# Patient Record
Sex: Female | Born: 1940
Health system: Southern US, Community
[De-identification: ages and names within clinical notes are randomized; demographics above are authoritative.]

## PROBLEM LIST (undated history)

## (undated) DIAGNOSIS — N183 Chronic kidney disease, stage 3 unspecified: Secondary | ICD-10-CM

## (undated) DIAGNOSIS — I10 Essential (primary) hypertension: Secondary | ICD-10-CM

## (undated) DIAGNOSIS — J45909 Unspecified asthma, uncomplicated: Secondary | ICD-10-CM

## (undated) DIAGNOSIS — J309 Allergic rhinitis, unspecified: Secondary | ICD-10-CM

## (undated) DIAGNOSIS — M4800 Spinal stenosis, site unspecified: Secondary | ICD-10-CM

## (undated) DIAGNOSIS — M1990 Unspecified osteoarthritis, unspecified site: Secondary | ICD-10-CM

## (undated) DIAGNOSIS — R7301 Impaired fasting glucose: Secondary | ICD-10-CM

## (undated) DIAGNOSIS — M8580 Other specified disorders of bone density and structure, unspecified site: Secondary | ICD-10-CM

## (undated) HISTORY — DX: Spinal stenosis, site unspecified: M48.00

## (undated) HISTORY — DX: Essential (primary) hypertension: I10

## (undated) HISTORY — PX: KNEE ARTHROSCOPY: SUR90

## (undated) HISTORY — DX: Impaired fasting glucose: R73.01

## (undated) HISTORY — PX: ABDOMINAL HYSTERECTOMY: SHX81

## (undated) HISTORY — DX: Other specified disorders of bone density and structure, unspecified site: M85.80

## (undated) HISTORY — PX: CHOLECYSTECTOMY: SHX55

## (undated) HISTORY — PX: TOTAL ABDOMINAL HYSTERECTOMY W/ BILATERAL SALPINGOOPHORECTOMY: SHX83

## (undated) HISTORY — PX: CARPAL TUNNEL RELEASE: SHX101

## (undated) HISTORY — PX: BREAST EXCISIONAL BIOPSY: SUR124

## (undated) HISTORY — DX: Unspecified osteoarthritis, unspecified site: M19.90

## (undated) HISTORY — PX: BACK SURGERY: SHX140

## (undated) HISTORY — DX: Unspecified asthma, uncomplicated: J45.909

## (undated) HISTORY — DX: Allergic rhinitis, unspecified: J30.9

## (undated) HISTORY — DX: Chronic kidney disease, stage 3 unspecified: N18.30

---

## 1999-02-03 ENCOUNTER — Encounter: Payer: Self-pay | Admitting: Neurosurgery

## 1999-02-03 ENCOUNTER — Ambulatory Visit (HOSPITAL_COMMUNITY): Admission: RE | Admit: 1999-02-03 | Discharge: 1999-02-03 | Payer: Self-pay | Admitting: Neurosurgery

## 2000-10-21 ENCOUNTER — Encounter: Payer: Self-pay | Admitting: General Surgery

## 2000-10-21 ENCOUNTER — Ambulatory Visit (HOSPITAL_COMMUNITY): Admission: RE | Admit: 2000-10-21 | Discharge: 2000-10-21 | Payer: Self-pay | Admitting: Family Medicine

## 2001-11-13 ENCOUNTER — Ambulatory Visit (HOSPITAL_COMMUNITY): Admission: RE | Admit: 2001-11-13 | Discharge: 2001-11-13 | Payer: Self-pay | Admitting: Family Medicine

## 2001-11-13 ENCOUNTER — Encounter: Payer: Self-pay | Admitting: Family Medicine

## 2001-12-19 ENCOUNTER — Encounter: Payer: Self-pay | Admitting: Specialist

## 2001-12-19 ENCOUNTER — Encounter: Admission: RE | Admit: 2001-12-19 | Discharge: 2001-12-19 | Payer: Self-pay | Admitting: Specialist

## 2002-02-05 ENCOUNTER — Encounter: Payer: Self-pay | Admitting: Specialist

## 2002-02-11 ENCOUNTER — Encounter: Payer: Self-pay | Admitting: Specialist

## 2002-02-11 ENCOUNTER — Inpatient Hospital Stay (HOSPITAL_COMMUNITY): Admission: RE | Admit: 2002-02-11 | Discharge: 2002-02-15 | Payer: Self-pay | Admitting: Specialist

## 2002-05-28 ENCOUNTER — Encounter: Payer: Self-pay | Admitting: Emergency Medicine

## 2002-05-28 ENCOUNTER — Emergency Department (HOSPITAL_COMMUNITY): Admission: EM | Admit: 2002-05-28 | Discharge: 2002-05-28 | Payer: Self-pay | Admitting: Emergency Medicine

## 2002-11-26 ENCOUNTER — Ambulatory Visit (HOSPITAL_COMMUNITY): Admission: RE | Admit: 2002-11-26 | Discharge: 2002-11-26 | Payer: Self-pay | Admitting: Family Medicine

## 2002-11-26 ENCOUNTER — Encounter: Payer: Self-pay | Admitting: Family Medicine

## 2003-11-29 ENCOUNTER — Ambulatory Visit (HOSPITAL_COMMUNITY): Admission: RE | Admit: 2003-11-29 | Discharge: 2003-11-29 | Payer: Self-pay | Admitting: Family Medicine

## 2004-11-27 ENCOUNTER — Ambulatory Visit (HOSPITAL_COMMUNITY): Admission: RE | Admit: 2004-11-27 | Discharge: 2004-11-27 | Payer: Self-pay | Admitting: Specialist

## 2004-11-27 ENCOUNTER — Ambulatory Visit (HOSPITAL_BASED_OUTPATIENT_CLINIC_OR_DEPARTMENT_OTHER): Admission: RE | Admit: 2004-11-27 | Discharge: 2004-11-27 | Payer: Self-pay | Admitting: Specialist

## 2004-12-14 ENCOUNTER — Ambulatory Visit (HOSPITAL_COMMUNITY): Admission: RE | Admit: 2004-12-14 | Discharge: 2004-12-14 | Payer: Self-pay | Admitting: Family Medicine

## 2005-12-10 ENCOUNTER — Ambulatory Visit (HOSPITAL_COMMUNITY): Admission: RE | Admit: 2005-12-10 | Discharge: 2005-12-10 | Payer: Self-pay | Admitting: Family Medicine

## 2005-12-18 ENCOUNTER — Ambulatory Visit (HOSPITAL_COMMUNITY): Admission: RE | Admit: 2005-12-18 | Discharge: 2005-12-18 | Payer: Self-pay | Admitting: Family Medicine

## 2006-12-23 ENCOUNTER — Ambulatory Visit (HOSPITAL_COMMUNITY): Admission: RE | Admit: 2006-12-23 | Discharge: 2006-12-23 | Payer: Self-pay | Admitting: Family Medicine

## 2007-12-25 ENCOUNTER — Ambulatory Visit (HOSPITAL_COMMUNITY): Admission: RE | Admit: 2007-12-25 | Discharge: 2007-12-25 | Payer: Self-pay | Admitting: Family Medicine

## 2008-12-27 ENCOUNTER — Ambulatory Visit (HOSPITAL_COMMUNITY): Admission: RE | Admit: 2008-12-27 | Discharge: 2008-12-27 | Payer: Self-pay | Admitting: Family Medicine

## 2009-12-29 ENCOUNTER — Ambulatory Visit (HOSPITAL_COMMUNITY): Admission: RE | Admit: 2009-12-29 | Discharge: 2009-12-29 | Payer: Self-pay | Admitting: Family Medicine

## 2010-01-05 ENCOUNTER — Ambulatory Visit (HOSPITAL_COMMUNITY): Admission: RE | Admit: 2010-01-05 | Discharge: 2010-01-05 | Payer: Self-pay | Admitting: Family Medicine

## 2010-01-22 LAB — HM COLONOSCOPY

## 2010-01-24 ENCOUNTER — Ambulatory Visit (HOSPITAL_COMMUNITY): Admission: RE | Admit: 2010-01-24 | Discharge: 2010-01-24 | Payer: Self-pay | Admitting: General Surgery

## 2010-03-18 ENCOUNTER — Observation Stay (HOSPITAL_COMMUNITY)
Admission: EM | Admit: 2010-03-18 | Discharge: 2010-03-19 | Payer: Self-pay | Source: Home / Self Care | Admitting: Emergency Medicine

## 2010-03-20 ENCOUNTER — Encounter: Payer: Self-pay | Admitting: Cardiology

## 2010-03-20 ENCOUNTER — Ambulatory Visit (HOSPITAL_COMMUNITY): Admission: RE | Admit: 2010-03-20 | Discharge: 2010-03-20 | Payer: Self-pay | Admitting: Internal Medicine

## 2010-07-06 ENCOUNTER — Ambulatory Visit (HOSPITAL_COMMUNITY)
Admission: RE | Admit: 2010-07-06 | Discharge: 2010-07-06 | Disposition: A | Discharge status: Home / Self Care | Payer: Medicare Other | Source: Ambulatory Visit | Attending: Specialist | Admitting: Specialist

## 2010-07-06 ENCOUNTER — Other Ambulatory Visit (HOSPITAL_COMMUNITY): Payer: Self-pay | Admitting: Specialist

## 2010-07-06 ENCOUNTER — Other Ambulatory Visit: Payer: Self-pay | Admitting: Specialist

## 2010-07-06 ENCOUNTER — Encounter (HOSPITAL_COMMUNITY): Payer: Medicare Other

## 2010-07-06 DIAGNOSIS — M1610 Unilateral primary osteoarthritis, unspecified hip: Secondary | ICD-10-CM | POA: Insufficient documentation

## 2010-07-06 DIAGNOSIS — Z01818 Encounter for other preprocedural examination: Secondary | ICD-10-CM

## 2010-07-06 DIAGNOSIS — Z981 Arthrodesis status: Secondary | ICD-10-CM | POA: Insufficient documentation

## 2010-07-06 DIAGNOSIS — Z01812 Encounter for preprocedural laboratory examination: Secondary | ICD-10-CM | POA: Insufficient documentation

## 2010-07-06 DIAGNOSIS — M169 Osteoarthritis of hip, unspecified: Secondary | ICD-10-CM | POA: Insufficient documentation

## 2010-07-06 DIAGNOSIS — I10 Essential (primary) hypertension: Secondary | ICD-10-CM | POA: Insufficient documentation

## 2010-07-06 LAB — COMPREHENSIVE METABOLIC PANEL
ALT: 13 U/L (ref 0–35)
Alkaline Phosphatase: 81 U/L (ref 39–117)
BUN: 12 mg/dL (ref 6–23)
CO2: 26 mEq/L (ref 19–32)
Creatinine, Ser: 0.65 mg/dL (ref 0.4–1.2)
Potassium: 4 mEq/L (ref 3.5–5.1)
Sodium: 139 mEq/L (ref 135–145)
Total Bilirubin: 0.8 mg/dL (ref 0.3–1.2)

## 2010-07-06 LAB — DIFFERENTIAL
Basophils Absolute: 0 10*3/uL (ref 0.0–0.1)
Basophils Relative: 0 % (ref 0–1)
Eosinophils Absolute: 0.1 10*3/uL (ref 0.0–0.7)
Eosinophils Relative: 1 % (ref 0–5)
Lymphocytes Relative: 40 % (ref 12–46)
Neutro Abs: 3.8 10*3/uL (ref 1.7–7.7)
Neutrophils Relative %: 50 % (ref 43–77)

## 2010-07-06 LAB — URINALYSIS, ROUTINE W REFLEX MICROSCOPIC
Leukocytes, UA: NEGATIVE
Nitrite: POSITIVE — AB
Urobilinogen, UA: 0.2 mg/dL (ref 0.0–1.0)

## 2010-07-06 LAB — URINE MICROSCOPIC-ADD ON: RBC / HPF: 2 RBC/hpf (ref <3)

## 2010-07-06 LAB — SURGICAL PCR SCREEN: Staphylococcus aureus: NEGATIVE

## 2010-07-06 LAB — PROTIME-INR
INR: 1.08 (ref 0.00–1.49)
Prothrombin Time: 14.2 seconds (ref 11.6–15.2)

## 2010-07-06 LAB — CBC
HCT: 40.7 % (ref 36.0–46.0)
Hemoglobin: 13 g/dL (ref 12.0–15.0)
MCHC: 31.9 g/dL (ref 30.0–36.0)
MCV: 89.8 fL (ref 78.0–100.0)
Platelets: 292 10*3/uL (ref 150–400)
RBC: 4.53 MIL/uL (ref 3.87–5.11)
RDW: 13.5 % (ref 11.5–15.5)
WBC: 7.6 10*3/uL (ref 4.0–10.5)

## 2010-07-13 ENCOUNTER — Inpatient Hospital Stay (HOSPITAL_COMMUNITY): Payer: Medicare Other

## 2010-07-13 ENCOUNTER — Inpatient Hospital Stay (HOSPITAL_COMMUNITY)
Admission: RE | Admit: 2010-07-13 | Discharge: 2010-07-17 | DRG: 470 | Disposition: A | Discharge status: Skilled Nursing Facility | Payer: Medicare Other | Source: Ambulatory Visit | Attending: Specialist | Admitting: Specialist

## 2010-07-13 DIAGNOSIS — I10 Essential (primary) hypertension: Secondary | ICD-10-CM | POA: Diagnosis present

## 2010-07-13 DIAGNOSIS — M1710 Unilateral primary osteoarthritis, unspecified knee: Principal | ICD-10-CM | POA: Diagnosis present

## 2010-07-13 DIAGNOSIS — D649 Anemia, unspecified: Secondary | ICD-10-CM | POA: Diagnosis not present

## 2010-07-13 DIAGNOSIS — I9589 Other hypotension: Secondary | ICD-10-CM | POA: Diagnosis not present

## 2010-07-13 DIAGNOSIS — M216X9 Other acquired deformities of unspecified foot: Secondary | ICD-10-CM | POA: Diagnosis present

## 2010-07-13 DIAGNOSIS — J45909 Unspecified asthma, uncomplicated: Secondary | ICD-10-CM | POA: Diagnosis present

## 2010-07-13 LAB — ABO/RH: ABO/RH(D): O POS

## 2010-07-13 LAB — TYPE AND SCREEN

## 2010-07-14 LAB — CBC
MCH: 28.7 pg (ref 26.0–34.0)
MCHC: 31.9 g/dL (ref 30.0–36.0)
MCV: 89.9 fL (ref 78.0–100.0)
WBC: 9.7 10*3/uL (ref 4.0–10.5)

## 2010-07-14 LAB — BASIC METABOLIC PANEL
BUN: 8 mg/dL (ref 6–23)
CO2: 29 mEq/L (ref 19–32)
Calcium: 8.7 mg/dL (ref 8.4–10.5)
Chloride: 104 mEq/L (ref 96–112)
Creatinine, Ser: 0.63 mg/dL (ref 0.4–1.2)
GFR calc Af Amer: >60 mL/min (ref >60)
GFR calc non Af Amer: >60 mL/min (ref >60)
Sodium: 137 mEq/L (ref 135–145)

## 2010-07-15 LAB — BASIC METABOLIC PANEL
Calcium: 8.7 mg/dL (ref 8.4–10.5)
Creatinine, Ser: 0.55 mg/dL (ref 0.4–1.2)
GFR calc non Af Amer: >60 mL/min (ref >60)
Sodium: 138 mEq/L (ref 135–145)

## 2010-07-15 LAB — PROTIME-INR
INR: 1.69 — ABNORMAL HIGH (ref 0.00–1.49)
Prothrombin Time: 20.1 seconds — ABNORMAL HIGH (ref 11.6–15.2)

## 2010-07-15 LAB — CBC
MCV: 90.6 fL (ref 78.0–100.0)
RBC: 3.31 MIL/uL — ABNORMAL LOW (ref 3.87–5.11)

## 2010-07-16 LAB — PROTIME-INR
INR: 1.76 — ABNORMAL HIGH (ref 0.00–1.49)
Prothrombin Time: 20.7 seconds — ABNORMAL HIGH (ref 11.6–15.2)

## 2010-07-16 LAB — CBC
HCT: 26.9 % — ABNORMAL LOW (ref 36.0–46.0)
MCH: 29.2 pg (ref 26.0–34.0)
MCV: 90.3 fL (ref 78.0–100.0)
RBC: 2.98 MIL/uL — ABNORMAL LOW (ref 3.87–5.11)
RDW: 13.7 % (ref 11.5–15.5)

## 2010-07-16 LAB — BASIC METABOLIC PANEL
BUN: 12 mg/dL (ref 6–23)
Potassium: 3.5 mEq/L (ref 3.5–5.1)

## 2010-07-17 LAB — BASIC METABOLIC PANEL
BUN: 12 mg/dL (ref 6–23)
GFR calc Af Amer: >60 mL/min (ref >60)
GFR calc non Af Amer: >60 mL/min (ref >60)
Potassium: 3.4 mEq/L — ABNORMAL LOW (ref 3.5–5.1)
Sodium: 140 mEq/L (ref 135–145)

## 2010-07-17 LAB — CBC
HCT: 28.9 % — ABNORMAL LOW (ref 36.0–46.0)
RBC: 3.2 MIL/uL — ABNORMAL LOW (ref 3.87–5.11)
RDW: 13.9 % (ref 11.5–15.5)
WBC: 10 10*3/uL (ref 4.0–10.5)

## 2010-07-17 LAB — PROTIME-INR: INR: 1.51 — ABNORMAL HIGH (ref 0.00–1.49)

## 2010-07-21 NOTE — Op Note (Signed)
[name], [name]             ACCOUNT NO.:  [id number]  MEDICAL RECORD NO.:  [id number]           PATIENT TYPE:  I  LOCATION:  0004                         FACILITY:  [facility]  PHYSICIAN:  Jene Every, M.D.    DATE OF BIRTH:  [birth year]  DATE OF PROCEDURE:  07/13/2010 DATE OF DISCHARGE:                              OPERATIVE REPORT   PREOPERATIVE DIAGNOSIS:  Degenerative joint disease, valgus right knee.  POSTOPERATIVE DIAGNOSIS:  Degenerative joint disease, valgus right knee.  PROCEDURE PERFORMED:  Right total knee arthroplasty.  COMPONENTS:  DePuy rotating platform, 2.5 femur, 3 tibia, 35 patella, 10 mm insert.  ANESTHESIA:  General.  ASSISTANT:  Roma Schanz, P.A.  HISTORY:  A 70 year old with end-stage osteoarthritis of the knee indicated for replacement.  Risk and benefits discussed including bleeding, infection, damage to vascular structures, suboptimal range of motion, component dissociation, DVT, PE, arthrofibrosis, anesthetic complications, etc.  TECHNIQUE:  The patient in supine position after induction of adequate anesthesia and 2 g Kefzol, the right lower extremity was prepped, draped, exsanguinated in the usual sterile fashion.  Thigh tourniquet inflated to 300 mmHg.  Knee was slightly flexed.  Midline incision was made full thickness, flaps developed.  Median parapatellar arthrotomy was performed.  Minimal soft tissue elevation was performed medially due to the valgus deformity.  The patella everted, knee flexed, tricompartmental osteoarthrosis was noted with osteophytes removed with a rongeur.  Removed the remnants of medial lateral menisci as well as the ACL.  Adapter was utilized to enter the femoral canal.  A 3-degree was utilized through the valgus deformity, 10 off the distal femur. Oscillating saw performed neck cut.  It was then sized off the anterior cortex to a 2.5 and 3 degrees of external rotation.  Anterior-posterior and chamfer cuts  were then performed.  Soft tissue protected at all times.  Attention turned towards the tibia.  It was subluxed 90 degrees. External alignment guide utilized parallel to the shaft, bisecting the ankle joint floor off the defect which was posterolateral, pinned, oscillating saw performed the tibial cut, sized to a 3 maximally with the center on the medial third of tibial tubercle.  It was pinned, centrally drilled.  Punch guide utilized.  Just prior to that, we checked our flexion and extension gaps and they were satisfactory with the 10-mm insert.  Attention turned back towards the femur.  Placed the block in the femur and anterior-posterior and chamfer cuts were then performed.  This was actually done prior to the preparation of the tibia.  After the tibia was then finished, then we performed the box cut with a guide bisecting the canal.  This was cut with an oscillating saw protecting the soft tissues at all times.  Rasp was utilized for the trial femur, tibia 10-mm insert, full extension, full flexion, good patellar tracking, good stability to varus and valgus stressing from 0- 30 degrees.  Patella was everted, measured at 21.  Planed to a 14 utilizing a planer guide.  We then used the 35 trial, we drilled our peg holes.  This in the appropriate fashion, reduced it and again trialed the patella.  There was good patellofemoral tracking.  All instrumentation was removed.  We checked posteriorly, residual osteophytes removed, any remnants of menisci were removed.  Geniculate vessels were cauterized.  Copiously irrigated with pulsatile lavage. Mixed cement on the back table in standard fashion.  Flexed the knee, dried all surfaces, injected cement in the proximal tibia, digitally pressurized the canal and the cement impacted the 3 tray.  Redundant cement removed.  Cemented the femoral component with redundant cement removed, placed the 10 insert, held in full extension with axial  load applied.  We cemented the patella with clamp after full curing of cement.  We trialed full extension and full flexion, good stability to varus and valgus stressing 0-30 degrees.  Good patellofemoral tracking. Negative anterior drawer.  Insert removed.  We checked the joint and meticulously removed all redundant cement.  Irrigated the wound and then placed the 10 insert permanent.  Again, we trialed that full extension, full flexion.  Good stability to varus and valgus stressing 0-30 degrees and good patellofemoral tracking.  Therefore placed Marcaine with epinephrine in the joint.  Placed a Hemovac, brought out through lateral stab wound in the skin.  Patella arthrotomy repaired with #1 Vicryl interrupted figure-of-eight sutures, subcutaneous with 0 and 2 Vicryl simple sutures.  Skin was reapproximated with staples.  Wound was dressed sterilely.  Placed supine in hospital bed after tourniquet was deflated at 90 minutes.  He was transported to the recovery room in satisfactory condition.  The patient tolerated the procedure well, no complications.  TOURNIQUET TIME:  90 minutes.     Jene Every, M.D.     [name]  D:  07/13/2010  T:  07/13/2010  Job:  [id number]  Electronically Signed by Jene Every M.D. on 07/21/2010 05:50:11 AM

## 2010-07-21 NOTE — H&P (Signed)
[name], [name]             ACCOUNT NO.:  [id number]  MEDICAL RECORD NO.:  [id number]        PATIENT TYPE:  LINP  LOCATION:                               FACILITY:  [facility]  PHYSICIAN:  Jene Every, M.D.    DATE OF BIRTH:  [birth year]  DATE OF ADMISSION:  07/13/2010 DATE OF DISCHARGE:                             HISTORY & PHYSICAL   CHIEF COMPLAINT:  Right knee pain.  HISTORY:  Ms. [name] is a pleasant 70 year old female who is a long- standing patient to our practice.  She has noted bilateral knee pain for quite some time.  Unfortunately, she has had a right lower extremity weakness secondary to permanent nerve damage from previous lumbar surgery, were reluctant to do any kind of operative intervention but the patient has noted persistent disabling pain.  She has failed conservative treatment.  X-rays do reveal tricompartmental osteoarthritis as well as end-stage valgus deformity of the right knee,felt this time she would benefit from a total knee arthroplasty.  The risks and benefits of the surgery were discussed with the patient and her family and they would like to proceed.  MEDICAL HISTORY:  Significant for: 1. Asthma. 2. Hypertension. 3. Osteoarthritis. 4. Again nerve damage from lumbar decompression on the right side.  MEDICATION: 1. Albuterol sulfate inhaler 2 puffs q.4 h. P.r.n. 2. Doxazosin 8 mg 1 p.o. daily. 3. Amlodipine besylate 10/40 one p.o. daily. 4. Aspirin 81 mg daily. 5. Calcium carbonate daily. 6. Vitamin D 1200 mg/1000 international units 2 tablets daily. 7. Furosemide 40 mg 1 p.o. daily,. 8. Potassium chloride 20 mEq 1 p.o. daily. 9. Tramadol 50 mg 1 p.o. q.6 h. p.r.n. pain.  ALLERGIES:  None listed.  PAST SURGICAL HISTORY:  Hysterectomy, lumbar fusion with chronic footdrop on the right.  The patient does have MAFO.  SOCIAL HISTORY:  The patient is married.  She is retired.  History of negative alcohol.  The patient quit smoking many  years ago.  Primary care physician is Dr. Simone Curia.  The patient is planning on going to rehab facility following surgery.  FAMILY HISTORY:  Mother and father significant for hypertension.  She does have children who have diabetes.  REVIEW OF SYSTEMS:  GENERAL:  The patient denies any fever, chills, night sweats or bleeding tendencies.  CNS:  No blurred or double vision, seizure, headache, or paralysis.  RESPIRATORY:  No shortness of breath, productive cough.  She does have occasional wheezing.  CARDIOVASCULAR: No chest pain, angina, or orthopnea.  GU:  No dysuria, hematuria, discharge.  GI:  No nausea, vomiting, diarrhea, constipation, melena. MUSCULOSKELETAL:  The patient has bilateral carpal tunnel syndrome, otherwise related to HPI.  PHYSICAL EXAMINATION:  VITAL SIGNS:  Pulses were 100, respiratory 16, BP 180/79.  Height 5 feet 5 inches, weight 175. GENERAL:  This is a healthy female, seen upright, in minimal distress. She does walk with antalgic gait utilizing a walker secondary to her right lower extremity weakness. HEENT:  Atraumatic, normocephalic.  Pupils equal, round, and reactive. EOMs intact. NECK:  Supple, no lymphadenopathy. CHEST:  Clear to auscultation bilaterally. HEART:  Regular rate and rhythm without murmurs, gallops, or  rubs. ABDOMEN:  Soft, nontender, nondistended.  Bowel sounds x4. SKIN:  No rashes or lesions are noted. EXTREMITIES:  In regards to the lower extremity, she does have mild effusion on the right.  She is tender along the medial and lateral compartment.  She has a valgus deformity and she does have chronic footdrop on the right with some atrophy.  Deep pedal pulses are equal.  IMPRESSION:  Degenerative joint disease, right knee.  PLAN:  The patient will be admitted to [facility] to undergo right total knee arthroplasty.     Roma Schanz, P.A.   ______________________________ Jene Every, M.D.    CS/MEDQ  D:   07/10/2010  T:  07/10/2010  Job:  [id number]  Electronically Signed by Roma Schanz P.A. on 07/14/2010 11:33:31 AM Electronically Signed by Jene Every M.D. on 07/21/2010 05:50:10 AM

## 2010-07-25 LAB — BASIC METABOLIC PANEL
BUN: 10 mg/dL (ref 6–23)
Chloride: 108 mEq/L (ref 96–112)
GFR calc Af Amer: >60 mL/min (ref >60)
Potassium: 3.6 mEq/L (ref 3.5–5.1)

## 2010-07-25 LAB — HEMOGLOBIN A1C: Hgb A1c MFr Bld: 5.6 % (ref <5.7)

## 2010-07-25 LAB — DIFFERENTIAL
Eosinophils Relative: 2 % (ref 0–5)
Lymphocytes Relative: 33 % (ref 12–46)
Lymphs Abs: 2.4 10*3/uL (ref 0.7–4.0)
Monocytes Absolute: 0.5 10*3/uL (ref 0.1–1.0)

## 2010-07-25 LAB — CARDIAC PANEL(CRET KIN+CKTOT+MB+TROPI)
CK, MB: 2 ng/mL (ref 0.3–4.0)
CK, MB: 2.7 ng/mL (ref 0.3–4.0)
Relative Index: 1 (ref 0.0–2.5)
Relative Index: 1.3 (ref 0.0–2.5)
Relative Index: 1.7 (ref 0.0–2.5)
Total CK: 202 U/L — ABNORMAL HIGH (ref 7–177)
Troponin I: 0.02 ng/mL (ref 0.00–0.06)

## 2010-07-25 LAB — T4, FREE: Free T4: 1.15 ng/dL (ref 0.80–1.80)

## 2010-07-25 LAB — POCT CARDIAC MARKERS
CKMB, poc: 1.8 ng/mL (ref 1.0–8.0)
Myoglobin, poc: 74.3 ng/mL (ref 12–200)
Myoglobin, poc: 84 ng/mL (ref 12–200)

## 2010-07-25 LAB — HEPATIC FUNCTION PANEL
ALT: 16 U/L (ref 0–35)
Indirect Bilirubin: 0.7 mg/dL (ref 0.3–0.9)
Total Protein: 8 g/dL (ref 6.0–8.3)

## 2010-07-25 LAB — CBC
HCT: 41 % (ref 36.0–46.0)
MCV: 89.9 fL (ref 78.0–100.0)
RBC: 4.56 MIL/uL (ref 3.87–5.11)
WBC: 7.2 10*3/uL (ref 4.0–10.5)

## 2010-07-25 LAB — LIPID PANEL: Triglycerides: 50 mg/dL (ref <150)

## 2010-07-25 LAB — TSH: TSH: 0.536 u[IU]/mL (ref 0.350–4.500)

## 2010-08-17 NOTE — Discharge Summary (Signed)
[name], [name]             ACCOUNT NO.:  [id number]  MEDICAL RECORD NO.:  [id number]           PATIENT TYPE:  I  LOCATION:  1621                         FACILITY:  [facility]  PHYSICIAN:  Jene Every, M.D.    DATE OF BIRTH:  07-Aug-1940  DATE OF ADMISSION:  07/13/2010 DATE OF DISCHARGE:                              DISCHARGE SUMMARY   Day of discharge is pending.  ADMISSION DIAGNOSES: 1. Degenerative joint disease, right knee. 2. Asthma. 3. Hypertension. 4. Osteoarthritis. 5. Permanent nerve damage from lumbar decompression right side with     chronic footdrop.  DISCHARGE DIAGNOSES: 1. Degenerative joint disease, right knee. 2. Asthma. 3. Hypertension. 4. Osteoarthritis. 5. Permanent nerve damage from lumbar decompression right side with     chronic footdrop. 6. Status post right total knee arthroplasty. 7. Asymptomatic postoperative anemia. 8. Asymptomatic hypotension, improved with hydration.  HISTORY:  [name] is a 70 year old female with a long-standing history of bilateral knee pain, right greater than left.  Unfortunately, she has right lower extremity weakness with chronic footdrop secondary to a lumbar surgery.  We have been looking her for quite sometime, doing kind of operative intervention, but the patient has noted persistent disabling pain.  X-rays do reveal tricompartmental osteoarthritis with a valgus deformity of the right knee.  It was felt at this time she would benefit from a total knee arthroplasty.  The risks and benefits of this were discussed with the patient.  She does elect to proceed.  PROCEDURE:  The patient was taken to the OR on July 13, 2010, and underwent right total knee arthroplasty.  SURGEON:  Jene Every, M.D.  ASSISTANT:  Roma Schanz, PA-C.  ANESTHESIA:  General.  COMPLICATIONS:  None.  CONSULT:  PT/OT Care Management.  LABORATORY DATA:  Preoperative CBC shows white cell count 7.6, hemoglobin 13.0,  hematocrit 40.7.  This was monitored throughout the hospital course.  White cell count did elevate to a high of 12.1 postoperatively secondary to Decadron, however, normalized at the time of discharge 10.2, hemoglobin did trend downwards.  Final CBC is pending at time of this dictation.  Last hemoglobin and hematocrit documented in the chart, currently is 8.7 and 26.9.  The patient is completely asymptomatic, not tachycardiac.  The patient was placed on Coumadin postoperatively for DVT prophylaxis as she did respond nicely.  At time of discharge, last labs showed INR of 1.76.  Again, final INR is pending.  She was bridged with Lovenox.  Chemistries done preoperatively showed sodium 139, potassium 4.0 with a normal BUN and creatinine; once again was monitored throughout the hospital course, remained stable. Preoperative urinalysis showed moderate blood, positive nitrites, however, 0 rbc's, wbc's noted per high power field, many bacteria were noted.  Blood type was positive.  MRSA and staph screen were negative. Preoperative chest x-ray showed no active disease.  HOSPITAL COURSE:  The patient was admitted, taken to the OR, and underwent the above-stated procedure.  One Hemovac drain was placed intraoperatively.  She was then transferred to PACU and then to the orthopedic floor for continued postoperative care.  Postop day #1, the patient was doing very well.  Pain was controlled.  She noted no complaints.  She had slight temperature at 99.4, pulse of 82, and rest of the vital signs were stable.  Hemoglobin dropped to 9.7, hematocrit 30.4.  The patient was placed on Coumadin for DVT prophylaxis. Currently, INR 1.33.  With regard to the lower extremity, calf is soft and nontender.  Hemovac drain was discontinued.  Discharge plan was initiated.  PT and OT was consulted.  On postop day #2, the patient continued to do well.  She was alert and oriented.  Foley was removed without difficulty.   Dressing was changed.  Incision was clean, dry, and intact.  Coumadin was continued as well as the Lovenox for bridging. The patient did progress well with physical therapy.  Over the course of next few days, the patient continued to progress without significant difficulty.  She continues to have low-grade fever at 99.0 and incentive spirometry was encouraged.  Hemoglobin did drop to 8.7, however, the patient was completely asymptomatic.  She has INR of 1.76.  Coumadin was continued.  Then discharge planning was continued.  Postop day #4, the patient is stable to be discharged to skilled nursing facility of choice.  Labs are still pending.  We will check her hemoglobin as well as INR prior to discharge.  Her incision remained clean and dry.  Motor and neurovascular function intact.  Again the patient does have chronic footdrop on the right.  DISPOSITION:  The patient is stable to be discharged to skilled nursing facility of choice pending INR as well as hemoglobin.  She is to follow with Dr. Shelle Iron in approximately 14 days from her surgical date for suture removal and x-ray.  ACTIVITY:  She is to ambulate as tolerated.  She will need to use her knee immobilizer until she straight leg raise x10 then she can discontinue that.  She will also need to use her MAFO when ambulating and elevate leg at least 6 times a day for 20 minutes at a time for edema control.  Wound care; change her dressing daily.  Keep this area clean and dry.  It is okay for her to shower.  DISCHARGE MEDICATIONS:  As per med rec sheet.  Again she will need to be on Coumadin.  We will determine if Lovenox is necessary pending her INR today.  She will need to keep her INR around 2 and 3.  Would recommend followup hemoglobin in 2-3 days.  The patient will be discharged on iron supplementation.  DIET:  As tolerated.  CONDITION ON DISCHARGE:  Stable.  FINAL DIAGNOSIS:  Doing well status post right total knee  arthroplasty.     Roma Schanz, P.A.   ______________________________ Jene Every, M.D.    CS/MEDQ  D:  07/17/2010  T:  07/17/2010  Job:  [id number]  Electronically Signed by Roma Schanz P.A. on 08/15/2010 05:25:13 PM Electronically Signed by Jene Every M.D. on 08/17/2010 12:54:03 PM

## 2010-09-27 ENCOUNTER — Other Ambulatory Visit: Payer: Self-pay | Admitting: Specialist

## 2010-09-27 ENCOUNTER — Other Ambulatory Visit (HOSPITAL_COMMUNITY): Payer: Self-pay | Admitting: Specialist

## 2010-09-27 ENCOUNTER — Encounter (HOSPITAL_COMMUNITY): Payer: Medicare Other

## 2010-09-27 ENCOUNTER — Ambulatory Visit (HOSPITAL_COMMUNITY)
Admission: RE | Admit: 2010-09-27 | Discharge: 2010-09-27 | Disposition: A | Discharge status: Home / Self Care | Payer: Medicare Other | Source: Ambulatory Visit | Attending: Specialist | Admitting: Specialist

## 2010-09-27 DIAGNOSIS — Z01818 Encounter for other preprocedural examination: Secondary | ICD-10-CM

## 2010-09-27 DIAGNOSIS — M1710 Unilateral primary osteoarthritis, unspecified knee: Secondary | ICD-10-CM | POA: Insufficient documentation

## 2010-09-27 DIAGNOSIS — Z01812 Encounter for preprocedural laboratory examination: Secondary | ICD-10-CM | POA: Insufficient documentation

## 2010-09-27 LAB — URINALYSIS, ROUTINE W REFLEX MICROSCOPIC
Bilirubin Urine: NEGATIVE
Glucose, UA: NEGATIVE mg/dL
Ketones, ur: NEGATIVE mg/dL
Leukocytes, UA: NEGATIVE
Nitrite: NEGATIVE
Protein, ur: NEGATIVE mg/dL
Specific Gravity, Urine: 1.01 (ref 1.005–1.030)
Urobilinogen, UA: 0.2 mg/dL (ref 0.0–1.0)
pH: 6.5 (ref 5.0–8.0)

## 2010-09-27 LAB — COMPREHENSIVE METABOLIC PANEL
ALT: 12 U/L (ref 0–35)
AST: 18 U/L (ref 0–37)
Alkaline Phosphatase: 99 U/L (ref 39–117)
CO2: 25 mEq/L (ref 19–32)
Chloride: 102 mEq/L (ref 96–112)
GFR calc Af Amer: >60 mL/min (ref >60)
GFR calc non Af Amer: >60 mL/min (ref >60)
Glucose, Bld: 145 mg/dL — ABNORMAL HIGH (ref 70–99)
Potassium: 3.4 mEq/L — ABNORMAL LOW (ref 3.5–5.1)
Sodium: 137 mEq/L (ref 135–145)
Total Bilirubin: 0.3 mg/dL (ref 0.3–1.2)

## 2010-09-27 LAB — SURGICAL PCR SCREEN: MRSA, PCR: NEGATIVE

## 2010-09-27 LAB — CBC
HCT: 42.4 % (ref 36.0–46.0)
Hemoglobin: 13.7 g/dL (ref 12.0–15.0)
MCH: 28 pg (ref 26.0–34.0)
MCHC: 32.3 g/dL (ref 30.0–36.0)
MCV: 86.5 fL (ref 78.0–100.0)
Platelets: 325 K/uL (ref 150–400)
RBC: 4.9 MIL/uL (ref 3.87–5.11)
RDW: 12.9 % (ref 11.5–15.5)
WBC: 6.8 K/uL (ref 4.0–10.5)

## 2010-09-27 LAB — APTT: aPTT: 34 seconds (ref 24–37)

## 2010-09-27 LAB — DIFFERENTIAL
Eosinophils Absolute: 0.1 10*3/uL (ref 0.0–0.7)
Lymphocytes Relative: 35 % (ref 12–46)
Lymphs Abs: 2.4 10*3/uL (ref 0.7–4.0)
Monocytes Relative: 7 % (ref 3–12)
Neutro Abs: 3.9 10*3/uL (ref 1.7–7.7)
Neutrophils Relative %: 58 % (ref 43–77)

## 2010-09-27 LAB — URINE MICROSCOPIC-ADD ON

## 2010-09-27 LAB — PROTIME-INR
INR: 1.13 (ref 0.00–1.49)
Prothrombin Time: 14.7 seconds (ref 11.6–15.2)

## 2010-09-29 NOTE — H&P (Signed)
NAME:  [name], [name]                       ACCOUNT NO.:  [id number]   MEDICAL RECORD NO.:  [id number]                   PATIENT TYPE:  INP   LOCATION:  NA                                   [facility]AC[facility]   PHYSICIAN:  Javier Docker, M.D.              DATE OF BIRTH:  [birth year]   DATE OF ADMISSION:  02/11/2002  DATE OF DISCHARGE:                                HISTORY & PHYSICAL   CHIEF COMPLAINT:  Low back pain with pain into both lower extremities.   HISTORY OF PRESENT ILLNESS:  The patient is a 70 year old female who  presented in the office back in July of 2003 with a history of foot drop x3  to four years, also with a history of spinal stenosis treated by Dr. Gerda Diss.  She is here for evaluation of this.  She is worked up by Dr. Shelle Iron with a  differential diagnosis of symptomatic spinal stenosis versus a peroneal  neuropathy secondary to her valgus knee.  With her continued symptoms, MRI  of her lumbar spine was obtained.  It was felt at that time that the L5-S1  radiculopathy was acute on chronic was noted with involvement of the  tibialis anterior, extensor hallicis on a chronic basis coming from her  lumbar spine as opposed to her peripheral nerve problem.  The MRI was  reviewed.  She does have apparently significant stenosis at L3-4 and  moderate at L4-5 but the lateral recess seems to be more significant than  the central stenosis. It was felt that the MRI did not fully give diagnostic  ability, so Dr. Shelle Iron ordered a flexion/extension CT myelogram.  On review  of the flexion/extension CT myelogram, it demonstrated severe spinal  stenosis L3-4 with grade I spondylolisthesis which is secondary to  degenerative and acquired factors.  There is mild spinal stenoses at L4-5.  There is facet arthropathy noted as well.  She has moderate stenosis L4-5,  spondylosis noted L2-3 and L5-S1.  It is felt at this time that the majority  of her symptoms are related to her severe  stenosis at L3-4, does not feel  that epidural steroid series will be of any long lasting benefit to the  patient.  Treatment included discussion of decompression L3-4 with fusion.  Dr. Shelle Iron and the patient discussed the risks and benefits of the procedure  and it was agreed upon that the patient will proceed with the procedure at  [facility].   PAST MEDICAL HISTORY:  1. Hypertension.  2. Benign heart murmur.   PAST SURGICAL HISTORY:  1. Hysterectomy in 1976.  2. Appendectomy in 1976.  3. Cholecystectomy in 1997.   MEDICATIONS:  1. Celebrex 100 mg two p.o. q.d.  2. Lotrel 5/20 mg one p.o. q.d.  3. Indapamide 2.5 mg one p.o. q.d.  4. Potassium 10 mEq two p.o. q.d.  5. Cardura 8 mg one p.o. q.d.  6.  Calcium with vitamin D two p.o. q.d.  7. Vicodin and Darvocet as needed for pain.   ALLERGIES:  No known drug allergies.   SOCIAL HISTORY:  The patient is retired. She lives at home with her husband.  She has four children.  She denies tobacco or alcohol use.  Her caretaking  following surgery will be her husband and her son.   FAMILY HISTORY:  This is significant for hypertension and CVA.   REVIEW OF SYSTEMS:  GENERAL:  The patient denies fever, chills, night  sweats, bleeding tendencies.  CNS:  The patient denies blurred or double  vision, seizures, headache or paralysis.  RESPIRATORY:  The patient denies  shortness of breath, productive cough or hemoptysis.  CARDIOVASCULAR:  The  patient denies chest pain, angina, or orthopnea.  GI:  The patient denies  nausea, vomiting, diarrhea, constipation or melena.  GU:  The patient denies  dysuria, hematuria or discharge.  MUSCULOSKELETAL:  As presented in HPI.   PHYSICAL EXAMINATION:  GENERAL APPEARANCE:  This is a well-developed, well-  nourished 71 year old female in no acute distress.  VITAL SIGNS:  Pulse 90, respiratory rate 20, blood pressure 150/90.  HEENT:  Atraumatic and normocephalic.  Pupils are equal, round and  reactive  to light.  EOMs intact.  NECK:  Supple with no lymphadenopathy.  CHEST:  Clear to auscultation bilaterally.  No wheezing, rhonchi or rales.  CARDIOVASCULAR:  Regular rate and rhythm with a grade IV systolic ejection  murmur.  ABDOMEN:  Soft, nontender and nondistended, bowel sounds x4, no  hepatosplenomegaly.  BREASTS AND GU:  Not examined, not pertinent to HPI.  SKIN:  No rashes or lesions.  EXTREMITIES:  The patient has positive straight leg raising on the right  with pain up into the buttocks and posterior thigh.  The pain is exacerbated  by dorsal augmentation maneuver. Quadriceps are 5-/5.  Contralateral  straight leg raise produces buttocks, posterior thigh and calf pain.  She  has a foot drop on the right.   X-RAYS:  Myelogram of the lumbar flexion and extension shows severe multi-  facet spinal stenosis L3-4 secondary to viral base central disc protrusion,  posterior element hypertrophy and 1.0 cm slip on the bases of degenerative  changes, bilateral L4 and bilateral L3 nerve root encroachment are present,  both worse on the right.  Severe posterior element hypertrophy at L4-5 along  with 2 to 3 mm of slip and broad based central disc protrusion bilateral L5  nerve root encroachment is present, left worse than right, bilateral L4  nerve root encroachment is present in the foramen.  Calcified disc  protrusion L4-S1 central and to the right with mild displacement of the  right S1 root.   PLAN:  The patient will be admitted to [facility] on February 11, 2002, to have lumbar decompression of L3-4 with posterior lumbar interbody  fusion, posterolateral fusion with pedicle screw instrumentation, allograft  bone with possible iliac crest bone graft to be performed by Dr. Jene Every.      Roma Schanz, P.A.                   Javier Docker, M.D.   CS/MEDQ  D:  02/02/2002  T:  02/02/2002  Job:  [id number]

## 2010-09-29 NOTE — Op Note (Signed)
[name], [name]             ACCOUNT NO.:  [id number]   MEDICAL RECORD NO.:  [id number]          PATIENT TYPE:  AMB   LOCATION:  [facility]                         FACILITY:  [facility]   PHYSICIAN:  Jene Every, M.D.    DATE OF BIRTH:  Jan 28, 1941   DATE OF PROCEDURE:  11/27/2004  DATE OF DISCHARGE:                                 OPERATIVE REPORT   PREOPERATIVE DIAGNOSIS:  Medial meniscal tear, degenerative joint disease,  left knee.   POSTOPERATIVE DIAGNOSIS:  Medial meniscal tear, degenerative joint disease,  left knee.  Grade 4 chondromalacia, medial compartment.  Grade 3  chondromalacia of the patella.   PROCEDURE PERFORMED:  Left knee arthroscopy, posterior medial meniscectomy,  chondroplasty, medial femoral condyle, medial tibial plateau, and of the  patella, debridement of the lateral compartment.   ANESTHESIA:  General.   ASSISTANT:  None.   BRIEF HISTORY/INDICATIONS:  A 70 year old female with refractory knee pain  and degenerative changes seen on her radiographs.  Occasional catching and  giving way.  Possible meniscus tear.  Operative intervention was indicated  for diagnosis, treatment, and debridement and avoid to delay total knee  arthroplasty.  Risks and benefits were discussed, including bleeding,  infection, damage to vascular structures, no DVT or PE, anesthetic  complications, no changes in symptoms or worsening symptoms, the need for  total knee arthroplasty in the future, etc.   TECHNIQUE:  With the patient in the supine position after the induction of  adequate general anesthesia and 1 gm of Kefzol, the left lower extremity was  prepped and draped in the usual sterile fashion.  The lateral parapatellar  portal and superomedial parapatellar portal was fashioned with a #11 blade.  Ingress cannula was atraumatically placed.  Irrigant was utilized to  insufflate the joint.  Under direct visualization, the medial parapatellar  portal was fashioned with a #11  blade after localization with an 18 gauge  needle, sparing the medial meniscus.  Chondroplasty, medial femoral condyle,  and tibial plateau was performed.  This was to a stable base.  ACL and PCL  were mildly frayed.  There were grade 3 changes of the patella.  Normal  patellofemoral tracking.  Chondroplasty of the patella was performed.  Lateral compartment with degenerative tearing and fraying of the meniscus  and some grade 3 changes of the femoral condyle.  This was debrided and  chondroplasty performed.  The knee was copiously evacuated.  Gutters were  unremarkable.  Re-examined the medial meniscus.  Stable to probe palpation.  Extensive grade 4 changes to the femoral condyle and tibial plateau were  noted.  Copiously lavaged the knee.  All instrumentation was removed.  Portals were closed with 4-0 nylon simple sutures.  Marcaine 0.25% with  epinephrine was infiltrated into the joint.  The wound was dressed  sterilely.  She was awakened without difficulty and transported to the  recovery room in satisfactory condition.   Patient tolerated the procedure well with no complications.       JB/MEDQ  D:  11/27/2004  T:  11/27/2004  Job:  [id number]

## 2010-09-29 NOTE — Op Note (Signed)
[name], [name]             ACCOUNT NO.:  [id number]   MEDICAL RECORD NO.:  [id number]          PATIENT TYPE:  AMB   LOCATION:  [facility]                         FACILITY:  [facility]   PHYSICIAN:  Jene Every, M.D.    DATE OF BIRTH:  1941-04-11   DATE OF PROCEDURE:  11/27/2004  DATE OF DISCHARGE:                                 OPERATIVE REPORT   PREOPERATIVE DIAGNOSIS:  Medial meniscus tear, degenerative joint disease of  the left knee.   POSTOPERATIVE DIAGNOSIS:  DICTATION ENDS HERE.       JB/MEDQ  D:  11/27/2004  T:  11/27/2004  Job:  [id number]

## 2010-09-29 NOTE — Op Note (Signed)
NAME:  [name], [name]                       ACCOUNT NO.:  [id number]   MEDICAL RECORD NO.:  [id number]                   PATIENT TYPE:  INP   LOCATION:  J478                                 FACILITY:  [facility]   PHYSICIAN:  Javier Docker, M.D.              DATE OF BIRTH:  January 29, 1941   DATE OF PROCEDURE:  02/11/2002  DATE OF DISCHARGE:                                 OPERATIVE REPORT   PREOPERATIVE DIAGNOSIS:  Severe spinal stenosis with spondylolisthesis, L3-  4.   POSTOPERATIVE DIAGNOSES:  1. Severe spinal stenosis with spondylolisthesis, L3-4.  2. Spinal stenosis, L4-5.   PROCEDURES:  1. Lumbar decompression, L3-4, L4-5.  2. Posterolateral fusion utilizing pedicle screw instrumentation, L3-4.  3. Local and BMP allograft.  4. Posterolateral fusion, L4-5.  5. Continuous neurologic monitoring as well as neurologic monitoring of     pedicle screws.   ANESTHESIA:  General.   SURGEON:  Javier Docker, M.D.   ASSISTANTPatricia Nettle, M.D.   BRIEF HISTORY:  A 70 year old with refractory lower extremity radicular pain  secondary to severe spinal stenosis and complete block.  Operative  intervention was indicated for decompression and fusion to avoid  translation.  Risks and benefits discussed, including bleeding, infection,  damage to nerves or vascular structures, no fusion, adjacent segment disease  for adjacent segment decompression, anesthetic complications, etc.   DESCRIPTION OF PROCEDURE:  The patient placed in supine position.  After  adequate general anesthesia and 1 g Kefzol, she was placed prone on the  Andrews frame and all bony prominences well-padded.  The lumbar region was  prepped and draped in the usual sterile fashion.  A 15-gauge spinal needle  was utilized to localize the 3-4 and 4-5 interspaces, confirmed with x-ray,  and an incision was made from the spinous process of 2 to 5.  The  subcutaneous tissue was dissected and cautery utilized to achieve  hemostasis.  The dorsal lumbar fascia identified and divided in the line of  the skin incision, paraspinous muscles elevated to the lamina of 3, 4, 5.  Confirmatory radiographs obtained with a Kocher on the spinous process of 3  and 4. The 3 and 4 spinous processes as well as the 5 were skeletonized.  A  rongeur was utilized to remove the spinous process of 3 and 4, morcellized  into cancellous bone graft for subsequent bone grafting.  A high-speed bur  was used to decorticate the lamina of 3 and 4.  There was high-density bone  appreciated, hypertrophic facets.  We took the dissection out and identified  the transverse processes of 3 and 4.  We performed a central decompression  at 3-4, removing the lamina at 3 and at 4.  There was hypertrophic  ligamentum flavum at 2-3 and at 3-4 noted.  Severe compression noted in the  lateral recess, particularly on the right.  There was a large bone  fragment  into the lateral recess into the foramen of 3-4 on the right.  We  decompressed the lateral recess and performed foraminotomies at 3 and 4.  Caudad to that there was significant stenosis noted at the 4-5 interval  after we removed the lamina of 4.  With that noted, we felt that the  decompression would be inadequate unless we extended it to 4-5.  The  ligamentum flavum was removed from 4-5, decompressing the lateral recess.  There was severe stenosis into the lateral recess compressing the 5 root,  particularly on the right.  This was well-decompressed after the  decompression.  Electrocautery was utilized to achieve strict hemostasis.  We therefore exposed the transverse process of 5.  Because of the slip noted  in flexion and extension on radiographs, we felt a central decompression  would predispose her perhaps to a slip although not grossly unstable,  therefore we did not feel it needed instrumentation.  The slip was reduced  at 3-4 on the radiograph.  We then identified the pedicles at 3 and  4  utilizing a combination of localization with the transverse process as well  as the pars.  There was hypertrophic facet and hypertrophic pars as well as  an apron to the TP, which made the pedicle termination difficult  particularly at 3 bilaterally.  In addition, she had fairly narrow in the  medial and lateral direction pedicles.  These were identified with a high-  speed bur, and this required a few redirections to fully identify the  pedicles of 3.  Once these were identified with the pedicle finder, we  tapped it and inserted screws 40 mm in length at 3, and then at 4 they were  40 mm in length as well.  We used 4.75 or 5.75 depending upon the diameter  of the pedicles, a 4.75 on the right at 3, 4.75 on the left at 4, 5.75 on  the left at 3, and 5.75 on the right at 4.  At all times we were watching  the pedicle medially, both sides, under direct visualization while the  screws were being inserted.  There was no insertion of the screw outside of  the pedicle noted medially or laterally.  Excellent purchase was noted.  Prior to insertion of the screws, we decorticated the pars and facet at 3-4  and the TPs at 3, 4, and 5.  After insertion of the screw, we looked in the  AP and lateral plane.  There was appropriate convergence and centering into  the pedicle.  We placed the cancellous bone that we used from the facets and  the spinous processes into the lateral gutters from 3 to 5.  There was ample  bone noted.  Then the bone from the laminectomies was placed upon that and  then we supplemented with DBM Synthes injectable paste.  We placed a hockey  stick in the foramina of 3, 4, and5, found it to be widely patent  bilaterally.  There was no evidence of CSF leakage or active bleeding.  We  also performed neurologic monitoring throughout the entire case.  We checked  the pedicle screws following their insertion, and they all indicated greater than 20 milliamps.  We also stimulated the  nerves on the right, found them  to be satisfactorily decompressed as well.   Next the wound was copiously irrigated with antibiotic irrigation.  We  checked in the canal, and there was no loose debris.  We placed  thrombin-  soaked Gelfoam over the laminotomy defect.  We placed a Hemovac and brought  it out through a lateral stab wound in the skin.  We repaired the fascia  with #1 Vicryl in figure-of-eight sutures in layers, the subcutaneous tissue  reapproximated with 0 and 2-0 Vicryl simple sutures, and the skin was  reapproximated with staples and dressed sterilely.  The patient was placed  supine on the hospital bed, extubated without difficulty, and transported to  the recovery room in satisfactory condition.   The patient tolerated the procedure well with no complications.  She has  used auto-save blood.  We gave her back two units of blood.                                                  Javier Docker, M.D.    JCB/MEDQ  D:  02/11/2002  T:  02/12/2002  Job:  [id number]

## 2010-09-29 NOTE — Discharge Summary (Signed)
NAME:  [name], [name]                       ACCOUNT NO.:  [id number]   MEDICAL RECORD NO.:  [id number]                   PATIENT TYPE:  INP   LOCATION:  0443                                 FACILITY:  [facility]   PHYSICIAN:  Jene Every, MD                   DATE OF BIRTH:  1940/10/08   DATE OF ADMISSION:  02/11/2002  DATE OF DISCHARGE:  02/15/2002                                 DISCHARGE SUMMARY   ADMISSION DIAGNOSES:  1. Spinal stenosis.  2. Spondylolisthesis.  3. Hypertension.  4. Hypopotassemia.   DISCHARGE DIAGNOSES:  1. Spinal stenosis, status post lumbar decompression at L3-4 with posterior     lumbar interbody fusion, posterior laminal fusion with pedical screw     instrumentation, allograft bone with proximal iliac crest bone graft.  2. Spondylolistheses.  3. Hypertension.  4. Hypopotassemia.   PROCEDURE:  The patient was taken to the operating room on 02/11/02, and  underwent a lumbar decompression of L3-4 with posterior lumbar interbody  fusion, posterior lateral fusion with pedicle screw instrumentation,  allograft bone with proximal iliac crest bone graft.  Surgeon was Dr.  Jene Every.  Assistant was Dr. Sharolyn Douglas.  Hemovac drain x1 was placed at  the time of surgery.   CONSULTATIONS:  1. Physical therapy and occupational therapy.  2. Case management.   HISTORY OF PRESENT ILLNESS:  The patient is a 70 year old female who  presented to the office back in 7/03, with a history of foot drop for three  to four years, also with a history of spinal stenosis treated by Dr. Gerda Diss.  She was worked up by Dr. __________ for differential diagnosis of  symptomatic spinal stenosis versus peroneal neuropathy secondary to her  valgus knee.  With continued symptoms, an MRI of her lumbar spine was  obtained; it was felt at that time that L5-S1 radiculopathy with acute on  chronic __________ involvement of tibialis anterior extensor hallicis on a  chronic basis coming from  the lumbar spine was the posterior peripheral  nerve problem.  She also has significant stenosis of L3-4 and moderate L4-5,  but the lateral recess seems to be more significant then central stenosis.   LABORATORY DATA:  Hemoglobin and hematocrit on admission were 13.4 and 39.5,  respectively.  Serial hemoglobins and hematocrits were followed throughout  hospital stay.  Hemoglobin and hematocrit did decline to 8.7 and 25.7 on  02/14/02, however, began to rise the following day to 8.9 and 25.9.  Routine  chemistries on admission showed glucose slightly high at 113, BUN high at  24.  Serial routine chemistries were followed with the potassium declining  to 3.2 on 02/15/02.  However, was stable at the time of discharge.  Urinalysis on admission showed trace amounts of hemoglobin.  The patient's  blood type is O positive with antibody screen negative.  EKG revealed sinus  tachycardia with nonspecific T-wave abnormality.  Preoperative chest films  revealed no evidence of acute cardiopulmonary disease.  Intraoperative views  of the lumbar spine revealed L3 and L4 pedicle screws.   HOSPITAL COURSE:  The patient was admitted to [facility] and taken  to the operating room.  She underwent the above stated procedure without  complications.  The patient tolerated the procedure well, and allowed to  return to the recovery room and then to the orthopaedic floor to continue  postoperative care.  On postoperative day #1, the patient was resting  comfortably without any complaints of nausea, no leg pain.  She had good  strength of her left lower extremity, 3/5 EHL on left lower extremity,  strength improving on left since surgery.  Foot drop was resolving.  The  patient remained n.p.o.  A brace was ordered from [employer] on 02/13/02,  postoperative day #2.  The patient is doing well.  Had no complaints.  Had  some residual right lower extremity pain.  Abdomen had good bowel sounds.  Foot drop was  resolving.  The patient was advanced to a clear liquid diet.  The patient was to have knee immobilizer and ankle and foot brace when out  of bed.  On 02/14/02, postoperative #3, seen by orthopaedics.  The patient  was doing well, had no complaints, had positive bowel movement.  Denies any  nausea.  Was improving with physical therapy.  On 02/15/02, seen by  orthopaedics, was doing well, was ready for discharge with knee immobilizer  and pain medications.   DISPOSITION:  The patient was discharged home on 02/15/2002.   DISCHARGE MEDICATIONS:  1. Robaxin 500 mg one p.o. q.6-8h. p.r.n. spasms.  2. Percocet 5/325 mg one or two p.o. q.4-6h. p.r.n. pain.   DIET:  As tolerated.   ACTIVITY:  The patient was to be up as tolerated.  She was encouraged to  walk.  No lifting.   FOLLOWUP:  The patient was to follow up with Dr. Shelle Iron in 7 to 10 days.  She  is to call for an appointment.   CONDITION ON DISCHARGE:  Stable and improved.       Clarene Reamer, P.A.-C.                   Jene Every, MD    SW/MEDQ  D:  03/05/2002  T:  03/05/2002  Job:  [id number]

## 2010-10-05 ENCOUNTER — Inpatient Hospital Stay (HOSPITAL_COMMUNITY): Payer: Medicare Other

## 2010-10-05 ENCOUNTER — Inpatient Hospital Stay (HOSPITAL_COMMUNITY)
Admission: RE | Admit: 2010-10-05 | Discharge: 2010-10-07 | DRG: 470 | Disposition: A | Discharge status: Home Health | Payer: Medicare Other | Source: Ambulatory Visit | Attending: Specialist | Admitting: Specialist

## 2010-10-05 DIAGNOSIS — M216X9 Other acquired deformities of unspecified foot: Secondary | ICD-10-CM | POA: Diagnosis present

## 2010-10-05 DIAGNOSIS — Z01812 Encounter for preprocedural laboratory examination: Secondary | ICD-10-CM

## 2010-10-05 DIAGNOSIS — I10 Essential (primary) hypertension: Secondary | ICD-10-CM | POA: Diagnosis present

## 2010-10-05 DIAGNOSIS — Z981 Arthrodesis status: Secondary | ICD-10-CM

## 2010-10-05 DIAGNOSIS — M1710 Unilateral primary osteoarthritis, unspecified knee: Principal | ICD-10-CM | POA: Diagnosis present

## 2010-10-05 DIAGNOSIS — Z96659 Presence of unspecified artificial knee joint: Secondary | ICD-10-CM

## 2010-10-05 LAB — URINALYSIS, ROUTINE W REFLEX MICROSCOPIC
Glucose, UA: NEGATIVE mg/dL
Ketones, ur: NEGATIVE mg/dL
Leukocytes, UA: NEGATIVE
Protein, ur: NEGATIVE mg/dL
pH: 5.5 (ref 5.0–8.0)

## 2010-10-05 LAB — TYPE AND SCREEN: ABO/RH(D): O POS

## 2010-10-05 LAB — URINE MICROSCOPIC-ADD ON

## 2010-10-06 LAB — CBC
HCT: 34 % — ABNORMAL LOW (ref 36.0–46.0)
Hemoglobin: 10.9 g/dL — ABNORMAL LOW (ref 12.0–15.0)
MCV: 86.3 fL (ref 78.0–100.0)
WBC: 10.2 10*3/uL (ref 4.0–10.5)

## 2010-10-06 LAB — BASIC METABOLIC PANEL
BUN: 8 mg/dL (ref 6–23)
CO2: 25 mEq/L (ref 19–32)
Chloride: 105 mEq/L (ref 96–112)
GFR calc non Af Amer: >60 mL/min (ref >60)
Glucose, Bld: 117 mg/dL — ABNORMAL HIGH (ref 70–99)
Potassium: 3.7 mEq/L (ref 3.5–5.1)
Sodium: 138 mEq/L (ref 135–145)

## 2010-10-07 LAB — CBC
HCT: 28.9 % — ABNORMAL LOW (ref 36.0–46.0)
MCHC: 32.9 g/dL (ref 30.0–36.0)
Platelets: 198 10*3/uL (ref 150–400)
RDW: 13.5 % (ref 11.5–15.5)

## 2010-10-07 LAB — BASIC METABOLIC PANEL
Calcium: 8.5 mg/dL (ref 8.4–10.5)
GFR calc non Af Amer: >60 mL/min (ref >60)
Glucose, Bld: 99 mg/dL (ref 70–99)
Sodium: 139 mEq/L (ref 135–145)

## 2010-10-08 LAB — URINE CULTURE
Colony Count: 2000
Culture  Setup Time: 201205242134

## 2010-10-12 NOTE — Op Note (Signed)
NAMEARIDAY, BRINKER             ACCOUNT NO.:  [id number]  MEDICAL RECORD NO.:  [id number]           PATIENT TYPE:  I  LOCATION:  0003                         FACILITY:  [facility]  PHYSICIAN:  Jene Every, M.D.    DATE OF BIRTH:  February 26, 1941  DATE OF PROCEDURE:  10/05/2010 DATE OF DISCHARGE:                              OPERATIVE REPORT   PREOPERATIVE DIAGNOSIS:  Degenerative joint disease of the left knee and valgus deformity.  POSTOPERATIVE DIAGNOSIS:  Degenerative joint disease of the left knee and valgus deformity.  PROCEDURE PERFORMED:  Left total knee arthroplasty.  ANESTHESIA:  General.  COMPONENTS:  DePuy rotating platform, 2.5 femur, 3 tibia, 10-mm insert, 35 patella.  BRIEF HISTORY:  This is a 70 year old with valgus deformity, end-stage osteoarthrosis, indicated for replacement.  Risk and benefits discussed including bleeding, infection, damage to neurovascular structures, suboptimal range of motion, need for revision, arthrofibrosis, DVT, PE, anesthetic complications, etc.  TECHNIQUE:  The patient in supine position.  After induction of adequate general anesthesia, 2 g Kefzol, left lower extremity prepped and draped and exsanguinated in usual sterile fashion.  Thigh tourniquet inflated to 300 mmHg.  Midline incision was made over the knee.  Full-thickness flap was developed.  Median parapatellar arthrotomy was performed. Patella was everted, knee was flexed.  We had minimal release of soft tissues medially.  Tricompartmental osteoarthrosis was noted with a valgus deformity.  It was nearly passively correctable.  The osteophytes were removed with a rongeur.  Step drill was utilized to enter the femoral canal.  We removed the remnants of the medial lateral meniscus as well as the ACL.  We used a step drill to enter the femoral canal, 3 degrees left was utilized.  We pinned 10 mm off the distal femur, performed the distal femoral cut followed by the chamfer cuts.   Soft tissues protected at all time.  We then subluxed the tibia, used external alignment guide 2 off the defect which was lateral, bisecting the ankle parallel to the tibial shaft, used oscillating saw to perform the tibial cut.  We then checked the flexion/extension gap, it was tight in extension.  We therefore removed additional 2 mm off the distal femur as well as performed our chamfer cuts.  We then re-trialed and the flexion/extension gaps were equivalent at 10 mm.  Next, attention was turned back towards completing the tibia.  Tibia was subluxed, __________ placed, maximized the plateau with a 3 on the medial third of the tibial tubercle, maximum coverage, pinned and drilled centrally, punch guide utilized.  Turned attention back towards completing the femur.  Box cut was then performed after centering the guide over the femoral canal.  The oscillating saw was used to remove the box. Following this, we placed a trial femur, tibia, and a 10 mm insert. Full extension, full flexion, good stability at varus-valgus stressing at 0 and 30 degrees.  Good alignment externally was noted.  Patella was then everted, measured to 20 and we selected 14 oscillating saw, performed the patellar cut utilizing the patellar clamp.  Then measured following the cut and it 13.5 mm.  We then medialized the  peg holes, used a clamp, drilled holes, put a trial patella on and reduced it. Good tracking of the patella was noted.  All trials were then removed. We checked posteriorly and cauterized the geniculate.  Any remnants of meniscus were removed as well as any remaining osteophytes.  We then used pulsatile lavage to clean the joint under low pressure.  Knee was then flexed.  All surfaces dried, tibia subluxed, and cement was mixed on the back table, injected into the canal and digitally pressurized. We then impacted a 10 permanent rotating platform tibial component into place.  Redundant cement removed.   Impacted femoral component in place, cemented and redundant cement removed.  Trial pin was placed.  Knee was held in extension with an axial load applied throughout the curing of the cement.  Redundant cement was removed.  We cemented the patella as well.  After full curing of cement, we had full flexion, full extension, good stability with varus-valgus stressing at 0 and 30 degrees, good patellofemoral tracking.  No anterior drawer.  We then removed the trial, inspected the joint, removed any redundant cement with an osteotome.  Copiously irrigated the wound, low-pressure lavage and then placed a 10 permanent polyethylene rotating platform insert.  Again full extension, full flexion, good stability with varus valgus stressing at 0 and 30 degrees.  Next placed Hemovac and brought out through lateral stab wound on the skin.  We used 0.25% Marcaine and injected into periosteum of femoral condyle and then deposited 20 cc into the joint. We repaired the patellar arthrotomy with #1 Vicryl interrupted figure-of- 8 sutures, subcu with 2-0 Vicryl simple sutures.  Skin was reapproximated with staples.  Wound was dressed sterilely.  She had flexion against gravity at 90 degrees.  Tourniquet was deflated with adequate revascularization of lower extremity appreciated.  Sterile dressing had been applied and secured with Ace bandage.  The patient tolerated the procedure well.  No complications.  Assistant was [employer].  Tourniquet time was 100 minutes.     Jene Every, M.D.     [name]  D:  10/05/2010  T:  10/05/2010  Job:  [id number]  Electronically Signed by Jene Every M.D. on 10/12/2010 09:42:13 AM

## 2010-10-12 NOTE — H&P (Signed)
NAMEREHMAT, MURTAGH             ACCOUNT NO.:  [id number]  MEDICAL RECORD NO.:  [id number]           [name] TYPE:  LOCATION:                                 FACILITY:  PHYSICIAN:  Jene Every, M.D.    DATE OF BIRTH:  1941/04/21  DATE OF ADMISSION:  10/05/2010 DATE OF DISCHARGE:                             HISTORY & PHYSICAL   CHIEF COMPLAINT:  Left knee pain.  HISTORY:  [name] is a pleasant 70 year old female who is well- known to our practice.  She has just recently undergone a right total knee arthroplasty and has done quite well from this.  She has now noted increasing left knee symptoms.  X-rays do reveal tricompartmental osteoarthritis.  It is felt at this point she would benefit from total knee arthroplasty.  Rebecca risks and benefits of this were discussed with [name].  She does wish to proceed.  MEDICAL HISTORY:  Significant for; 1. Asthma. 2. Hypertension, currently not well controlled. 3. Osteoarthritis. 4. She does have nerve damage on Rebecca right lower extremity secondary     to previous lumbar decompression.  MEDICATIONS:  Review of [name]'s medicines include; Albuterol inhaler 2 puffs q.4 p.r.n. Doxazosin 8 mg 1 p.o. daily. Amlodipine besylate 10/40 one p.o. daily. Aspirin 81 mg daily. Calcium carbonate daily. Vitamin D. Furosemide 40 mg 1 p.o. daily. Potassium chloride 20 mEq 1 p.o. daily. Tramadol 50 mg 1 p.o. q.6 p.r.n. pain. New medication of clonidine has been added for hypertension control.  ALLERGIES:  None.  PAST SURGICAL HISTORY:  Previous surgeries include hysterectomy, lumbar fusion with chronic footdrop on Rebecca right.  Status post right total knee arthroplasty.  FAMILY HISTORY:  [name] history significant for hypertension.  SOCIAL HISTORY:  [name] lives with her husband who unfortunately is blind.  She is retired.  History noted for alcohol consumption.  She was a previous tobacco user.  She plans to go home  after her surgery.  REVIEW OF SYSTEMS:  GENERAL:  [name] denies any fever, chills, night sweats, or bleeding tendencies.  CNS:  No blurred or double vision, seizure, headache or paralysis.  RESPIRATORY:  No shortness of breath, productive cough, or hemoptysis.  CARDIOVASCULAR:  No chest pain, angina, or orthopnea.  GU:  No dysuria, hematuria, discharge.  GI: No nausea, vomiting, diarrhea, constipation, melena, or blood stools. MUSCULOSKELETAL:  As per HPI.  PHYSICAL EXAMINATION:  VITAL SIGNS:  Pulse is 88, respiratory rate 12, BP 170/70 in both upper extremities. GENERAL:  In general, this is a well-developed, well-nourished female, seen upright, in no acute distress.  She does walk with antalgic gait utilizing a walker. HEENT: Atraumatic, normocephalic.  Pupils equal, round, and reactive to light.  EOMs intact. NECK:  Supple.  No lymphadenopathy. CHEST:  Clear to auscultation bilaterally. HEART:  Regular rate and rhythm without murmurs, gallops, or rubs. ABDOMEN:  Soft, nontender, nondistended.  Bowel sounds x4. SKIN:  No rashes or lesions noted.  She has a well-healed incision on Rebecca right knee.  In regard to her left knee there is mild effusion.  She is tender along Rebecca medial compartment.  There is mild crepitus on exam.  IMPRESSION:  Degenerative joint disease, left knee.  PLAN:  [name] will be admitted to [facility] and undergo a left total knee arthroplasty.  As of note she did experience constipation after her last surgery.  We will start Dulcolax postop day #1.  Also consider placing on Xarelto.  She has not had great response to Coumadin and has been on Lovenox for extended period of time.     Roma Schanz, P.A.   ______________________________ Jene Every, M.D.    CS/MEDQ  D:  09/28/2010  T:  09/28/2010  Job:  [id number]  Electronically Signed by Roma Schanz P.A. on 10/02/2010 10:14:33 AM Electronically Signed by Jene Every  M.D. on 10/12/2010 09:42:10 AM

## 2010-10-30 NOTE — Discharge Summary (Signed)
[name], [name]             ACCOUNT NO.:  [id number]  MEDICAL RECORD NO.:  [id number]  LOCATION:  1620                         FACILITY:  [facility]  PHYSICIAN:  Jene Every, M.D.    DATE OF BIRTH:  1940/09/03  DATE OF ADMISSION:  10/05/2010 DATE OF DISCHARGE:  10/07/2010                              DISCHARGE SUMMARY   ADMISSION DIAGNOSES: 1. Degenerative joint disease, left knee. 2. Asthma. 3. Hypertension. 4. Osteoarthritis. 5. Permanent nerve damage, right lower extremity.  DISCHARGE DIAGNOSES: 1. Degenerative joint disease, left knee, status post left total knee     arthroplasty. 2. Asthma. 3. Hypertension. 4. Osteoarthritis. 5. Permanent nerve damage, right lower extremity.  HISTORY:  [name] is a pleasant 70 year old female who is well known to our practice.  She recently just undergone right total knee arthroplasty, has done quite well from this.  She has noted persistent disabling symptoms on the left.  Records do reveal tricompartmental osteoarthritis, so at this time she would benefit from a total knee arthroplasty.  Risks and benefits were discussed, and she does elect to proceed.  PROCEDURE:  The patient was taken to the OR on Oct 07, 2010 and underwent left total knee arthroplasty.  Surgeon, Jene Every, M.D. Assistant, Roma Schanz, P.A. Anesthesia, general.  Complications none.  CONSULTATIONS:  PT, OT, Care Management.  LABORATORY DATA:  Admission CBC showed white cell count of 10.2, hemoglobin 10.9, hematocrit 34.0.  This was monitored  throughout the hospital stay.  At the time of discharge, hemoglobin was 9.5, hematocrit 28.9.  Routine chemistries were within normal range pre and postoperatively.  Urinalysis at the time of admission showed positive nitrites, negative leukocyte esterase, 36 wbc's noted per high-powered field.  Blood type O positive.  Urine culture showed 2000 colonies of E coli.  The patient was started on  appropriate antibiotic, treat accordingly.  HOSPITAL COURSE:  The patient was admitted, taken to the OR and underwent the above-stated procedure without difficulties.  She was then transferred to the PACU and then to the orthopedic floor for obtaining postop repair.  Intraoperatively, one Hemovac drain was placed.  She was placed on PC analgesic for pain relief.  On postop day #1, the patient was doing very well, minimal pain.  She denied any chest pain or shortness of breath.  She denied any urinary-type symptoms.  She had a slight low-grade temp.  She was passing flatus.  Hemovac drain was removed with tip intact.  Compartments were soft.  Motor and neurovascular were intact.  Cipro was started per pharmacy for UTI. Incentive spirometer was encouraged.  Xarelto was started for DVT prophylaxis.  Postop day #2, on-call physician saw and evaluated the patient.  Incision was clean and dry.  He felt labs were stable at that time.  She was discharged home.  DISPOSITION:  The patient discharged home with home health PT, OT.  ACTIVITY:  She is to ambulate as tolerated.  Elevate her leg 6 times a day for 20 minutes at a time.  Use knee immobilizer until she can straight leg raise x10.  She is to follow up with Dr. Shelle Iron in approximately 10-14 days from surgery for suture removal and x-ray.  MEDICATIONS:  As per med rec sheet.  DIET:  As tolerated.  CONDITION ON DISCHARGE:  Stable.  FINAL DIAGNOSIS:  Doing well status post left total knee arthroplasty.     Roma Schanz, P.A.   ______________________________ Jene Every, M.D.    CS/MEDQ  D:  10/18/2010  T:  10/18/2010  Job:  [id number]  Electronically Signed by Roma Schanz P.A. on 10/26/2010 12:22:01 PM Electronically Signed by Jene Every M.D. on 10/30/2010 03:39:23 PM

## 2010-11-28 ENCOUNTER — Other Ambulatory Visit: Payer: Self-pay | Admitting: Family Medicine

## 2010-11-28 DIAGNOSIS — Z139 Encounter for screening, unspecified: Secondary | ICD-10-CM

## 2011-01-01 ENCOUNTER — Ambulatory Visit (HOSPITAL_COMMUNITY)
Admission: RE | Admit: 2011-01-01 | Discharge: 2011-01-01 | Disposition: A | Discharge status: Home / Self Care | Payer: Medicare Other | Source: Ambulatory Visit | Attending: Family Medicine | Admitting: Family Medicine

## 2011-01-01 DIAGNOSIS — Z1231 Encounter for screening mammogram for malignant neoplasm of breast: Secondary | ICD-10-CM | POA: Insufficient documentation

## 2011-01-01 DIAGNOSIS — Z139 Encounter for screening, unspecified: Secondary | ICD-10-CM

## 2011-06-25 DIAGNOSIS — N8112 Cystocele, lateral: Secondary | ICD-10-CM | POA: Diagnosis not present

## 2011-08-31 ENCOUNTER — Ambulatory Visit (INDEPENDENT_AMBULATORY_CARE_PROVIDER_SITE_OTHER): Payer: Medicare Other | Admitting: Urology

## 2011-08-31 DIAGNOSIS — R3915 Urgency of urination: Secondary | ICD-10-CM

## 2011-08-31 DIAGNOSIS — N390 Urinary tract infection, site not specified: Secondary | ICD-10-CM

## 2011-08-31 DIAGNOSIS — N8111 Cystocele, midline: Secondary | ICD-10-CM

## 2011-08-31 DIAGNOSIS — N3289 Other specified disorders of bladder: Secondary | ICD-10-CM | POA: Diagnosis not present

## 2011-09-20 DIAGNOSIS — M19019 Primary osteoarthritis, unspecified shoulder: Secondary | ICD-10-CM | POA: Diagnosis not present

## 2011-10-30 DIAGNOSIS — R310 Gross hematuria: Secondary | ICD-10-CM | POA: Diagnosis not present

## 2011-10-31 DIAGNOSIS — N390 Urinary tract infection, site not specified: Secondary | ICD-10-CM | POA: Diagnosis not present

## 2011-10-31 DIAGNOSIS — R82998 Other abnormal findings in urine: Secondary | ICD-10-CM | POA: Diagnosis not present

## 2011-10-31 DIAGNOSIS — R310 Gross hematuria: Secondary | ICD-10-CM | POA: Diagnosis not present

## 2011-11-09 ENCOUNTER — Ambulatory Visit (INDEPENDENT_AMBULATORY_CARE_PROVIDER_SITE_OTHER): Payer: Medicare Other | Admitting: Urology

## 2011-11-09 ENCOUNTER — Other Ambulatory Visit: Payer: Self-pay | Admitting: Urology

## 2011-11-09 DIAGNOSIS — R310 Gross hematuria: Secondary | ICD-10-CM

## 2011-11-09 DIAGNOSIS — Z87440 Personal history of urinary (tract) infections: Secondary | ICD-10-CM

## 2011-11-16 ENCOUNTER — Ambulatory Visit (HOSPITAL_COMMUNITY)
Admission: RE | Admit: 2011-11-16 | Discharge: 2011-11-16 | Disposition: A | Discharge status: Home / Self Care | Payer: Medicare Other | Source: Ambulatory Visit | Attending: Urology | Admitting: Urology

## 2011-11-16 DIAGNOSIS — R310 Gross hematuria: Secondary | ICD-10-CM | POA: Diagnosis not present

## 2011-11-16 DIAGNOSIS — N1330 Unspecified hydronephrosis: Secondary | ICD-10-CM | POA: Diagnosis not present

## 2011-11-30 ENCOUNTER — Other Ambulatory Visit: Payer: Self-pay | Admitting: Urology

## 2011-11-30 DIAGNOSIS — R310 Gross hematuria: Secondary | ICD-10-CM

## 2011-12-17 DIAGNOSIS — R310 Gross hematuria: Secondary | ICD-10-CM | POA: Diagnosis not present

## 2011-12-19 ENCOUNTER — Ambulatory Visit (HOSPITAL_COMMUNITY)
Admission: RE | Admit: 2011-12-19 | Discharge: 2011-12-19 | Disposition: A | Discharge status: Home / Self Care | Payer: Medicare Other | Source: Ambulatory Visit | Attending: Urology | Admitting: Urology

## 2011-12-19 DIAGNOSIS — R310 Gross hematuria: Secondary | ICD-10-CM

## 2011-12-21 ENCOUNTER — Ambulatory Visit (HOSPITAL_COMMUNITY)
Admission: RE | Admit: 2011-12-21 | Discharge: 2011-12-21 | Disposition: A | Discharge status: Home / Self Care | Payer: Medicare Other | Source: Ambulatory Visit | Attending: Urology | Admitting: Urology

## 2011-12-21 DIAGNOSIS — I10 Essential (primary) hypertension: Secondary | ICD-10-CM | POA: Insufficient documentation

## 2011-12-21 DIAGNOSIS — N281 Cyst of kidney, acquired: Secondary | ICD-10-CM | POA: Diagnosis not present

## 2011-12-21 DIAGNOSIS — R9389 Abnormal findings on diagnostic imaging of other specified body structures: Secondary | ICD-10-CM | POA: Insufficient documentation

## 2011-12-21 DIAGNOSIS — R310 Gross hematuria: Secondary | ICD-10-CM | POA: Insufficient documentation

## 2011-12-21 MED ORDER — IOHEXOL 300 MG/ML  SOLN
125.0000 mL | Freq: Once | INTRAMUSCULAR | Status: AC | PRN
  Administered 2011-12-21: 125 mL via INTRAVENOUS

## 2011-12-24 ENCOUNTER — Other Ambulatory Visit: Payer: Self-pay | Admitting: Family Medicine

## 2011-12-24 DIAGNOSIS — Z139 Encounter for screening, unspecified: Secondary | ICD-10-CM

## 2012-01-04 ENCOUNTER — Ambulatory Visit (HOSPITAL_COMMUNITY)
Admission: RE | Admit: 2012-01-04 | Discharge: 2012-01-04 | Disposition: A | Discharge status: Home / Self Care | Payer: Medicare Other | Source: Ambulatory Visit | Attending: Family Medicine | Admitting: Family Medicine

## 2012-01-04 DIAGNOSIS — Z1231 Encounter for screening mammogram for malignant neoplasm of breast: Secondary | ICD-10-CM | POA: Insufficient documentation

## 2012-01-04 DIAGNOSIS — Z139 Encounter for screening, unspecified: Secondary | ICD-10-CM

## 2012-01-08 ENCOUNTER — Other Ambulatory Visit: Payer: Self-pay | Admitting: Urology

## 2012-01-08 DIAGNOSIS — R319 Hematuria, unspecified: Secondary | ICD-10-CM

## 2012-01-10 DIAGNOSIS — IMO0002 Reserved for concepts with insufficient information to code with codable children: Secondary | ICD-10-CM | POA: Diagnosis not present

## 2012-01-10 DIAGNOSIS — M1710 Unilateral primary osteoarthritis, unspecified knee: Secondary | ICD-10-CM | POA: Diagnosis not present

## 2012-02-01 ENCOUNTER — Ambulatory Visit (HOSPITAL_COMMUNITY)
Admission: RE | Admit: 2012-02-01 | Discharge: 2012-02-01 | Disposition: A | Discharge status: Home / Self Care | Payer: Medicare Other | Source: Ambulatory Visit | Attending: Urology | Admitting: Urology

## 2012-02-01 DIAGNOSIS — R319 Hematuria, unspecified: Secondary | ICD-10-CM

## 2012-02-06 ENCOUNTER — Other Ambulatory Visit: Payer: Self-pay | Admitting: Urology

## 2012-02-06 ENCOUNTER — Ambulatory Visit
Admission: RE | Admit: 2012-02-06 | Discharge: 2012-02-06 | Disposition: A | Discharge status: Home / Self Care | Payer: Medicare Other | Source: Ambulatory Visit | Attending: Urology | Admitting: Urology

## 2012-02-06 ENCOUNTER — Other Ambulatory Visit (HOSPITAL_COMMUNITY): Payer: Medicare Other

## 2012-02-06 DIAGNOSIS — N8111 Cystocele, midline: Secondary | ICD-10-CM | POA: Diagnosis not present

## 2012-02-06 DIAGNOSIS — R319 Hematuria, unspecified: Secondary | ICD-10-CM

## 2012-02-06 MED ORDER — GADOBENATE DIMEGLUMINE 529 MG/ML IV SOLN
17.0000 mL | Freq: Once | INTRAVENOUS | Status: AC | PRN
  Administered 2012-02-06: 17 mL via INTRAVENOUS

## 2012-02-07 ENCOUNTER — Other Ambulatory Visit: Payer: Self-pay | Admitting: Urology

## 2012-02-07 DIAGNOSIS — R319 Hematuria, unspecified: Secondary | ICD-10-CM

## 2012-02-08 ENCOUNTER — Ambulatory Visit (INDEPENDENT_AMBULATORY_CARE_PROVIDER_SITE_OTHER): Payer: Medicare Other | Admitting: Urology

## 2012-02-08 DIAGNOSIS — N281 Cyst of kidney, acquired: Secondary | ICD-10-CM | POA: Diagnosis not present

## 2012-02-08 DIAGNOSIS — Z87440 Personal history of urinary (tract) infections: Secondary | ICD-10-CM

## 2012-02-08 DIAGNOSIS — R3915 Urgency of urination: Secondary | ICD-10-CM | POA: Diagnosis not present

## 2012-02-20 DIAGNOSIS — Z23 Encounter for immunization: Secondary | ICD-10-CM | POA: Diagnosis not present

## 2012-05-30 ENCOUNTER — Ambulatory Visit (INDEPENDENT_AMBULATORY_CARE_PROVIDER_SITE_OTHER): Payer: Medicare Other | Admitting: Urology

## 2012-05-30 DIAGNOSIS — Z87440 Personal history of urinary (tract) infections: Secondary | ICD-10-CM

## 2012-05-30 DIAGNOSIS — R3129 Other microscopic hematuria: Secondary | ICD-10-CM | POA: Diagnosis not present

## 2012-05-30 DIAGNOSIS — R3915 Urgency of urination: Secondary | ICD-10-CM | POA: Diagnosis not present

## 2012-05-30 DIAGNOSIS — R350 Frequency of micturition: Secondary | ICD-10-CM | POA: Diagnosis not present

## 2012-06-16 DIAGNOSIS — I10 Essential (primary) hypertension: Secondary | ICD-10-CM | POA: Diagnosis not present

## 2012-06-16 DIAGNOSIS — R7301 Impaired fasting glucose: Secondary | ICD-10-CM | POA: Diagnosis not present

## 2012-06-16 DIAGNOSIS — G5790 Unspecified mononeuropathy of unspecified lower limb: Secondary | ICD-10-CM | POA: Diagnosis not present

## 2012-07-01 DIAGNOSIS — Z79899 Other long term (current) drug therapy: Secondary | ICD-10-CM | POA: Diagnosis not present

## 2012-07-01 DIAGNOSIS — R5381 Other malaise: Secondary | ICD-10-CM | POA: Diagnosis not present

## 2012-07-01 DIAGNOSIS — R5383 Other fatigue: Secondary | ICD-10-CM | POA: Diagnosis not present

## 2012-07-01 DIAGNOSIS — E782 Mixed hyperlipidemia: Secondary | ICD-10-CM | POA: Diagnosis not present

## 2012-08-02 ENCOUNTER — Other Ambulatory Visit: Payer: Self-pay

## 2012-08-02 MED ORDER — POTASSIUM CHLORIDE ER 20 MEQ PO TBCR: 20.0000 meq | EXTENDED_RELEASE_TABLET | Freq: Every day | ORAL | Status: DC

## 2012-08-04 DIAGNOSIS — H269 Unspecified cataract: Secondary | ICD-10-CM | POA: Insufficient documentation

## 2012-08-25 ENCOUNTER — Encounter: Payer: Self-pay | Admitting: *Deleted

## 2012-08-26 ENCOUNTER — Encounter: Payer: Self-pay | Admitting: Family Medicine

## 2012-08-26 ENCOUNTER — Ambulatory Visit (INDEPENDENT_AMBULATORY_CARE_PROVIDER_SITE_OTHER): Payer: Medicare Other | Admitting: Family Medicine

## 2012-08-26 VITALS — BP 148/72 | HR 90 | Ht 63.25 in | Wt 200.2 lb

## 2012-08-26 DIAGNOSIS — I10 Essential (primary) hypertension: Secondary | ICD-10-CM | POA: Insufficient documentation

## 2012-08-26 MED ORDER — POTASSIUM CHLORIDE ER 20 MEQ PO TBCR: 20.0000 meq | EXTENDED_RELEASE_TABLET | Freq: Every day | ORAL | Status: DC

## 2012-08-26 MED ORDER — DOXAZOSIN MESYLATE 8 MG PO TABS: 8.0000 mg | ORAL_TABLET | Freq: Every day | ORAL | Status: DC

## 2012-08-26 MED ORDER — TRAMADOL HCL 50 MG PO TABS: 50.0000 mg | ORAL_TABLET | Freq: Four times a day (QID) | ORAL | Status: DC | PRN

## 2012-08-26 NOTE — Progress Notes (Signed)
  Subjective:    Patient ID: [name], female    DOB: 1941/02/06, 72 y.o.   MRN: [id number]  HPI  Going to [place]. Due to fly via jet. Worried about it. Flying with daughter. [place] lay over, one hour. Pos hx of anxiety with mri.   Review of Systems Otherwise neg    Objective:   Physical Exam  Alert no acute distress. Lungs clear. Heart regular rate rhythm. HEENT normal. Blood pressure improved on repeat 138/74.      Assessment & Plan:  Impression significant anxiety regarding potential flying. Plan clonazepam 1 mg #6 use 1 hour prior to flight. 15 minutes spent most in discussion. For prior appointment. WSL

## 2012-08-26 NOTE — Patient Instructions (Signed)
Use the anxiety med about an hour and a half before boarding the plane

## 2012-10-16 ENCOUNTER — Telehealth: Payer: Self-pay | Admitting: Family Medicine

## 2012-10-16 MED ORDER — CLONIDINE HCL 0.2 MG PO TABS: 0.2000 mg | ORAL_TABLET | Freq: Two times a day (BID) | ORAL | Status: DC

## 2012-10-16 NOTE — Telephone Encounter (Signed)
Clonidine 0.2 mg #60 One BID 0 refills sent to [facility] [place] pt notified

## 2012-10-16 NOTE — Telephone Encounter (Signed)
Nurse/Dr. Brett Canales Pharmacy: [name] - [place]  cloNIDine (CATAPRES) 0.2 MG tablet  Patient usually orders medications. through mail order delivery. They left off this particular prescription on her last order request. They will be sending out her new order 10/22/12, but per the patient she run out of her current medication this Sunday.  [name] would like to know if she can get a short-order supply until her normal prescription arrives.

## 2012-11-12 ENCOUNTER — Ambulatory Visit (INDEPENDENT_AMBULATORY_CARE_PROVIDER_SITE_OTHER): Payer: Medicare Other | Admitting: Family Medicine

## 2012-11-12 ENCOUNTER — Encounter: Payer: Self-pay | Admitting: Family Medicine

## 2012-11-12 VITALS — BP 170/90 | Temp 98.0°F | Wt 200.0 lb

## 2012-11-12 DIAGNOSIS — J329 Chronic sinusitis, unspecified: Secondary | ICD-10-CM

## 2012-11-12 MED ORDER — CEFPROZIL 500 MG PO TABS: 500.0000 mg | ORAL_TABLET | Freq: Two times a day (BID) | ORAL | Status: DC

## 2012-11-12 NOTE — Patient Instructions (Signed)
Afrin nasal spray twice per day for a maximum of five days  Use inhaler four times per day until better

## 2012-11-12 NOTE — Progress Notes (Signed)
  Subjective:    Patient ID: [name], female    DOB: 02-Oct-1940, 72 y.o.   MRN: [id number]  Sinusitis This is a new problem. The current episode started in the past 7 days. The problem has been gradually worsening since onset. There has been no fever. Associated symptoms include congestion, coughing, sinus pressure and sneezing. (Bloody discharge.) Past treatments include nothing (started amox). The treatment provided mild relief.    A lot of sneezing  no fever. Review of Systems  HENT: Positive for congestion, sneezing and sinus pressure.   Respiratory: Positive for cough.        Objective:   Physical Exam  An in and alert mild malaise HEENT moderate nasal congestion vitals reviewed lungs bilateral wheezes no tachypnea no crackles heart regular rate and rhythm.      Assessment & Plan:  Impression rhinosinusitis with exacerbation of reactive airways. Plan albuterol regularly, Cefzil 500 by mouth twice a day 10 days. Symptomatic care discussed. WSL

## 2012-11-28 ENCOUNTER — Ambulatory Visit (INDEPENDENT_AMBULATORY_CARE_PROVIDER_SITE_OTHER): Payer: Medicare Other | Admitting: Urology

## 2012-11-28 DIAGNOSIS — Z87440 Personal history of urinary (tract) infections: Secondary | ICD-10-CM | POA: Diagnosis not present

## 2012-11-28 DIAGNOSIS — R3915 Urgency of urination: Secondary | ICD-10-CM

## 2012-12-04 ENCOUNTER — Other Ambulatory Visit: Payer: Self-pay | Admitting: Family Medicine

## 2012-12-04 DIAGNOSIS — Z139 Encounter for screening, unspecified: Secondary | ICD-10-CM

## 2013-01-06 ENCOUNTER — Ambulatory Visit (HOSPITAL_COMMUNITY)
Admission: RE | Admit: 2013-01-06 | Discharge: 2013-01-06 | Disposition: A | Discharge status: Home / Self Care | Payer: Medicare Other | Source: Ambulatory Visit | Attending: Family Medicine | Admitting: Family Medicine

## 2013-01-06 DIAGNOSIS — Z1231 Encounter for screening mammogram for malignant neoplasm of breast: Secondary | ICD-10-CM | POA: Insufficient documentation

## 2013-01-06 DIAGNOSIS — Z139 Encounter for screening, unspecified: Secondary | ICD-10-CM

## 2013-02-24 ENCOUNTER — Ambulatory Visit (INDEPENDENT_AMBULATORY_CARE_PROVIDER_SITE_OTHER): Payer: Medicare Other

## 2013-02-24 DIAGNOSIS — Z23 Encounter for immunization: Secondary | ICD-10-CM

## 2013-03-20 ENCOUNTER — Other Ambulatory Visit: Payer: Self-pay | Admitting: Family Medicine

## 2013-03-25 ENCOUNTER — Other Ambulatory Visit: Payer: Self-pay | Admitting: Family Medicine

## 2013-04-18 ENCOUNTER — Emergency Department (HOSPITAL_COMMUNITY)
Admission: EM | Admit: 2013-04-18 | Discharge: 2013-04-18 | Disposition: A | Discharge status: Home / Self Care | Payer: Medicare Other | Attending: Emergency Medicine | Admitting: Emergency Medicine

## 2013-04-18 ENCOUNTER — Encounter (HOSPITAL_COMMUNITY): Payer: Self-pay | Admitting: Emergency Medicine

## 2013-04-18 ENCOUNTER — Emergency Department (HOSPITAL_COMMUNITY): Payer: Medicare Other

## 2013-04-18 DIAGNOSIS — R609 Edema, unspecified: Secondary | ICD-10-CM | POA: Diagnosis not present

## 2013-04-18 DIAGNOSIS — Z79899 Other long term (current) drug therapy: Secondary | ICD-10-CM | POA: Diagnosis not present

## 2013-04-18 DIAGNOSIS — J45901 Unspecified asthma with (acute) exacerbation: Secondary | ICD-10-CM | POA: Diagnosis not present

## 2013-04-18 DIAGNOSIS — Z792 Long term (current) use of antibiotics: Secondary | ICD-10-CM | POA: Insufficient documentation

## 2013-04-18 DIAGNOSIS — R000 Tachycardia, unspecified: Secondary | ICD-10-CM | POA: Diagnosis not present

## 2013-04-18 DIAGNOSIS — M129 Arthropathy, unspecified: Secondary | ICD-10-CM | POA: Diagnosis not present

## 2013-04-18 DIAGNOSIS — I10 Essential (primary) hypertension: Secondary | ICD-10-CM | POA: Insufficient documentation

## 2013-04-18 DIAGNOSIS — R079 Chest pain, unspecified: Secondary | ICD-10-CM | POA: Diagnosis not present

## 2013-04-18 DIAGNOSIS — R0602 Shortness of breath: Secondary | ICD-10-CM | POA: Diagnosis not present

## 2013-04-18 DIAGNOSIS — Z7982 Long term (current) use of aspirin: Secondary | ICD-10-CM | POA: Insufficient documentation

## 2013-04-18 DIAGNOSIS — J159 Unspecified bacterial pneumonia: Secondary | ICD-10-CM | POA: Diagnosis not present

## 2013-04-18 DIAGNOSIS — J189 Pneumonia, unspecified organism: Secondary | ICD-10-CM | POA: Diagnosis not present

## 2013-04-18 LAB — CBC WITH DIFFERENTIAL/PLATELET
Basophils Absolute: 0 10*3/uL (ref 0.0–0.1)
HCT: 38.9 % (ref 36.0–46.0)
Hemoglobin: 12.7 g/dL (ref 12.0–15.0)
Lymphocytes Relative: 21 % (ref 12–46)
Lymphs Abs: 2.3 10*3/uL (ref 0.7–4.0)
MCHC: 32.6 g/dL (ref 30.0–36.0)
Monocytes Absolute: 1 10*3/uL (ref 0.1–1.0)
Monocytes Relative: 10 % (ref 3–12)
Neutro Abs: 7.5 10*3/uL (ref 1.7–7.7)
Neutrophils Relative %: 69 % (ref 43–77)
RBC: 4.32 MIL/uL (ref 3.87–5.11)

## 2013-04-18 LAB — BASIC METABOLIC PANEL
Calcium: 9.9 mg/dL (ref 8.4–10.5)
GFR calc Af Amer: >90 mL/min (ref >90)
GFR calc non Af Amer: 82 mL/min — ABNORMAL LOW (ref >90)
Potassium: 3.7 mEq/L (ref 3.5–5.1)
Sodium: 141 mEq/L (ref 135–145)

## 2013-04-18 MED ORDER — LEVOFLOXACIN 750 MG PO TABS
750.0000 mg | ORAL_TABLET | Freq: Once | ORAL | Status: AC
  Administered 2013-04-18: 750 mg via ORAL
  Filled 2013-04-18: qty 1

## 2013-04-18 MED ORDER — ACETAMINOPHEN 500 MG PO TABS
1000.0000 mg | ORAL_TABLET | Freq: Once | ORAL | Status: AC
  Administered 2013-04-18: 1000 mg via ORAL
  Filled 2013-04-18: qty 2

## 2013-04-18 MED ORDER — LEVOFLOXACIN 750 MG PO TABS: 750.0000 mg | ORAL_TABLET | Freq: Every day | ORAL | Status: DC

## 2013-04-18 MED ORDER — ALBUTEROL SULFATE (5 MG/ML) 0.5% IN NEBU
5.0000 mg | INHALATION_SOLUTION | Freq: Once | RESPIRATORY_TRACT | Status: AC
  Administered 2013-04-18: 5 mg via RESPIRATORY_TRACT
  Filled 2013-04-18: qty 1

## 2013-04-18 NOTE — ED Notes (Signed)
Patient with no complaints at this time. Respirations even and unlabored. Skin warm/dry. Discharge instructions reviewed with patient at this time. Patient given opportunity to voice concerns/ask questions. IV removed per policy and band-aid applied to site. Patient discharged at this time and left Emergency Department with steady gait.  

## 2013-04-18 NOTE — ED Provider Notes (Signed)
CSN: [id number]     Arrival date & time 04/18/13  1955 History  This chart was scribed for Rebecca Roller, MD by Quintella Reichert, ED scribe.  This patient was seen in room APA04/APA04 and the patient's care was started at 8:35 PM.   Chief Complaint  Patient presents with  . Chest Pain    The history is provided by the patient. No language interpreter was used.    HPI Comments: [name] is a 72 y.o. female with h/o HTN and asthma who presents to the Emergency Department complaining of chest pain on breathing that began on waking this morning.  Pt states she feels her pain is due to a virus as it is associated with chills, dry cough, and nasal congestion.  She also notes she has had some wheezing and had to use her albuterol inhaler a few nights ago.  She denies vomiting or sore throat.  She has felt chilled but denies measured fever.  She has chronic leg swellng but denies recent changes.  She denies h/o DVT, DM, or heart disease   She has had bilateral knee surgery but last surgery was last year.  She denies recent long-distance travel, hormone therapy, or recent trauma.  She does not smoke.  Pt notes that approximately a week ago she was exposed to her grandchildren who had colds.  She received her flu shot this year.  She has had a pneumonia shot.  Pt's asthma developed in later life and generally flares up seasonally, particularly in the spring.   Past Medical History  Diagnosis Date  . Hypertension   . Arthritis   . Asthma   . Allergic rhinitis   . IFG (impaired fasting glucose)   . Osteopenia   . Spinal stenosis     Past Surgical History  Procedure Laterality Date  . Abdominal hysterectomy    . Total abdominal hysterectomy w/ bilateral salpingoophorectomy    . Cholecystectomy    . Back surgery    . Knee arthroscopy Left     Family History  Problem Relation Age of Onset  . Diabetes Mother   . Hypertension Mother   . Diabetes Father   . Hypertension Father      History  Substance Use Topics  . Smoking status: Never Smoker   . Smokeless tobacco: Not on file  . Alcohol Use: Not on file    OB History   Grav Para Term Preterm Abortions TAB SAB Ect Mult Living                  Review of Systems  All other systems reviewed and are negative.     Allergies  Detrol  Home Medications   Current Outpatient Rx  Name  Route  Sig  Dispense  Refill  . amLODipine-benazepril (LOTREL) 10-40 MG per capsule   Oral   Take 1 capsule by mouth daily.         Marland Kitchen aspirin EC 81 MG tablet   Oral   Take 81 mg by mouth daily.         . calcium carbonate (TUMS EX) 750 MG chewable tablet   Oral   Chew 1 tablet by mouth daily as needed. indegestion         . Calcium Carbonate-Vit D-Min (CALCIUM 1200 PO)   Oral   Take 1 tablet by mouth 2 (two) times daily.          . carboxymethylcellulose (REFRESH PLUS) 0.5 % SOLN  Both Eyes   Place 1 drop into both eyes 2 (two) times daily as needed. Dry eyes         . cloNIDine (CATAPRES) 0.2 MG tablet   Oral   Take 1 tablet (0.2 mg total) by mouth 2 (two) times daily.   60 tablet   0   . doxazosin (CARDURA) 8 MG tablet   Oral   Take 1 tablet (8 mg total) by mouth at bedtime.   90 tablet   2   . furosemide (LASIX) 40 MG tablet   Oral   Take 40 mg by mouth daily.         Marland Kitchen KLOR-CON M20 20 MEQ tablet   Oral   Take 20 mEq by mouth daily.         Marland Kitchen triamcinolone cream (KENALOG) 0.1 %   Topical   Apply topically 2 (two) times daily.         Marland Kitchen albuterol (PROVENTIL HFA;VENTOLIN HFA) 108 (90 BASE) MCG/ACT inhaler   Inhalation   Inhale 2 puffs into the lungs every 4 (four) hours as needed for wheezing.         Marland Kitchen levofloxacin (LEVAQUIN) 750 MG tablet   Oral   Take 1 tablet (750 mg total) by mouth daily.   10 tablet   0   . traMADol (ULTRAM) 50 MG tablet   Oral   Take 1 tablet (50 mg total) by mouth every 6 (six) hours as needed for pain.   360 tablet   2    Temp(Src) 99.7  F (37.6 C) (Oral)  SpO2 94%  Physical Exam  Nursing note and vitals reviewed. Constitutional: She is oriented to person, place, and time. She appears well-developed and well-nourished. No distress.  HENT:  Head: Normocephalic and atraumatic.  Nose: Nose normal.  Mouth/Throat: Posterior oropharyngeal erythema present.  Eyes: Conjunctivae are normal. Pupils are equal, round, and reactive to light. No scleral icterus.  Neck: Neck supple.  Cardiovascular: Regular rhythm, normal heart sounds and intact distal pulses.  Tachycardia present.   No murmur heard. Mild tachycardia  Pulmonary/Chest: No stridor. Tachypnea (mild) noted. No respiratory distress. She has wheezes (slight expiratory wheeze). She has no rales.  Abdominal: Soft. Bowel sounds are normal. She exhibits no distension. There is no tenderness.  Musculoskeletal: Normal range of motion. She exhibits edema.  Scant pitting edema at the ankles bilaterally.  No asymmetry.  Neurological: She is alert and oriented to person, place, and time.  Skin: Skin is warm and dry. No rash noted.  Psychiatric: She has a normal mood and affect. Her behavior is normal.    ED Course  Procedures (including critical care time)  DIAGNOSTIC STUDIES: Oxygen Saturation is 94% on room air, adequate by my interpretation.    COORDINATION OF CARE: 8:42 PM: Discussed treatment plan which includes breathing treatment, EKG and CXR.  Pt expressed understanding and agreed to plan.   Labs Review Labs Reviewed  CBC WITH DIFFERENTIAL - Abnormal; Notable for the following:    WBC 10.9 (*)    All other components within normal limits  BASIC METABOLIC PANEL - Abnormal; Notable for the following:    Glucose, Bld 141 (*)    GFR calc non Af Amer 82 (*)    All other components within normal limits     Imaging Review Dg Chest 2 View  04/18/2013   CLINICAL DATA:  Shortness of breath, chills and bilateral shoulder pain.  EXAM: CHEST  2 VIEW  COMPARISON:  None.   FINDINGS: The lungs are well-aerated. Left basilar airspace opacification raises concern for mild pneumonia. There is no evidence of pleural effusion or pneumothorax.  The heart is borderline enlarged; the mediastinal contour is within normal limits. No acute osseous abnormalities are seen.  IMPRESSION: Left basilar airspace opacification raises concern for mild pneumonia. Borderline cardiomegaly.   Electronically Signed   By: Roanna Raider M.D.   On: 04/18/2013 21:21    EKG Interpretation    Date/Time:  Saturday April 18 2013 19:57:04 EST Ventricular Rate:  100 PR Interval:  136 QRS Duration: 78 QT Interval:  318 QTC Calculation: 410 R Axis:   35 Text Interpretation:  Sinus rhythm with Premature atrial complexes Nonspecific T wave abnormality Abnormal ECG When compared with ECG of 18-Mar-2010 09:32, Nonspecific T wave abnormality, worse in Anterolateral leads Confirmed by Jaydeen Darley  MD, Mikaela Hilgeman ([id number]) on 04/18/2013 8:10:18 PM            MDM   1. CAP (community acquired pneumonia)    Over the course of the patient's visit she had a significant improvement in her heart rate down to 85 beats per minute, oxygenation has improved up to 97% on room air, her tachypnea is now breathing 20 breaths per minute, lab work shows white blood cell count of 10,900, normal renal function and a chest x-ray which is suggestive of a left lower lobe pneumonia. The patient feels well, she desires to be discharged after being informed of her results and offered admission to the hospital for observation as a option. I believe at this time given the patient's significant improvement, normal vital signs inability to take her medications as well as her ability to return should her symptoms worsen and her understanding that she is able to freely express of the indications for return that she is stable and appropriate for discharge.  She will be given Levaquin as an antibiotic, she will has been instructed to take  albuterol inhaler 2 puffs every 4 hours for the next 24 hours then as needed.   Meds given in ED:  Medications  albuterol (PROVENTIL) (5 MG/ML) 0.5% nebulizer solution 5 mg (5 mg Nebulization Given 04/18/13 2121)  levofloxacin (LEVAQUIN) tablet 750 mg (750 mg Oral Given 04/18/13 2145)  acetaminophen (TYLENOL) tablet 1,000 mg (1,000 mg Oral Given 04/18/13 2304)    New Prescriptions   LEVOFLOXACIN (LEVAQUIN) 750 MG TABLET    Take 1 tablet (750 mg total) by mouth daily.        I personally performed the services described in this documentation, which was scribed in my presence. The recorded information has been reviewed and is accurate.      Rebecca Roller, MD 04/18/13 [phone]

## 2013-04-18 NOTE — ED Notes (Signed)
Woke this morning with mid chest pain with radiation from shoulders, cough and chills. Now pain worse esp.with breathing.

## 2013-04-18 NOTE — ED Notes (Signed)
C/O aching pain bilateral shoulders w/deep breath.  Relates having chills off and on and SOB all day.

## 2013-04-21 ENCOUNTER — Encounter: Payer: Self-pay | Admitting: Family Medicine

## 2013-04-21 ENCOUNTER — Ambulatory Visit (INDEPENDENT_AMBULATORY_CARE_PROVIDER_SITE_OTHER): Payer: Medicare Other | Admitting: Family Medicine

## 2013-04-21 VITALS — BP 160/90 | Ht 63.0 in | Wt 198.0 lb

## 2013-04-21 DIAGNOSIS — J189 Pneumonia, unspecified organism: Secondary | ICD-10-CM | POA: Diagnosis not present

## 2013-04-21 MED ORDER — TRIAMCINOLONE ACETONIDE 0.1 % EX CREA: TOPICAL_CREAM | Freq: Two times a day (BID) | CUTANEOUS | Status: DC

## 2013-04-21 MED ORDER — AZITHROMYCIN 250 MG PO TABS: ORAL_TABLET | ORAL | Status: AC

## 2013-04-21 MED ORDER — AZITHROMYCIN 250 MG PO TABS: ORAL_TABLET | ORAL | Status: DC

## 2013-04-21 NOTE — Progress Notes (Signed)
   Subjective:    Patient ID: [name], female    DOB: 03-Apr-1941, 72 y.o.   MRN: [id number]  HPI Patient was seen at [facility] on 04/18/13 ans was diagnosed was pneumonia. She is here today for a follow up visit. States her breathing is doing a lot better. She is currently taking Levaquin for the pneumonia.   Pt having nightmaes and screaming on the med  States ring finger on left hand itches and feels "funny"., itching on a finger, almost done with Levaquin anyway.  Review of Systems No chest pain no headache no abdominal pain no change in bowel habits cough much improved ROS otherwise negative    Objective:   Physical Exam  Alert good hydration. H&T normal. Lungs currently clear. No wheezes heart regular in rhythm.      Assessment & Plan:  Impression 1 status post pneumonia diagnosis clinically improved. Not enough symptoms to call major allergy to Levaquin but did appear to cause some nonspecific itching. Plan symptomatic care discussed. Finish antibiotics 1 signs discussed. WSL

## 2013-04-28 ENCOUNTER — Other Ambulatory Visit: Payer: Self-pay | Admitting: Family Medicine

## 2013-05-26 ENCOUNTER — Other Ambulatory Visit: Payer: Self-pay | Admitting: Family Medicine

## 2013-06-02 ENCOUNTER — Encounter: Payer: Self-pay | Admitting: Family Medicine

## 2013-06-02 ENCOUNTER — Ambulatory Visit (INDEPENDENT_AMBULATORY_CARE_PROVIDER_SITE_OTHER): Payer: Medicare Other | Admitting: Family Medicine

## 2013-06-02 ENCOUNTER — Ambulatory Visit (HOSPITAL_COMMUNITY)
Admission: RE | Admit: 2013-06-02 | Discharge: 2013-06-02 | Disposition: A | Discharge status: Home / Self Care | Payer: Medicare Other | Source: Ambulatory Visit | Attending: Family Medicine | Admitting: Family Medicine

## 2013-06-02 VITALS — BP 168/74 | Temp 98.3°F | Ht 63.0 in | Wt 198.0 lb

## 2013-06-02 DIAGNOSIS — I10 Essential (primary) hypertension: Secondary | ICD-10-CM | POA: Diagnosis not present

## 2013-06-02 DIAGNOSIS — Z8701 Personal history of pneumonia (recurrent): Secondary | ICD-10-CM | POA: Diagnosis not present

## 2013-06-02 DIAGNOSIS — I700 Atherosclerosis of aorta: Secondary | ICD-10-CM | POA: Insufficient documentation

## 2013-06-02 DIAGNOSIS — J189 Pneumonia, unspecified organism: Secondary | ICD-10-CM | POA: Diagnosis not present

## 2013-06-02 DIAGNOSIS — R079 Chest pain, unspecified: Secondary | ICD-10-CM | POA: Diagnosis not present

## 2013-06-02 DIAGNOSIS — J984 Other disorders of lung: Secondary | ICD-10-CM | POA: Insufficient documentation

## 2013-06-02 DIAGNOSIS — R071 Chest pain on breathing: Secondary | ICD-10-CM | POA: Insufficient documentation

## 2013-06-02 NOTE — Progress Notes (Signed)
   Subjective:    Patient ID: [name], female    DOB: [birth year], 73 y.o.   MRN: [id number]  HPIHaving left side pain when taking a deep breath and also having night sweats. Started Saturday.   Sat pt noticed discomfort left side of the chest, sharp in nature  No cough  Some sig night sweats  No sob no wheezing  Pos pain with deep breath, and worse with certain motions  Deep breath   Review of Systems No high fevers. No loss of consciousness. No headache. No change in bowel habits no blood in stool ROS otherwise negative    Objective:   Physical Exam Alert mild malaise. H&T normal. Lungs clear heart rare rhythm. Left sided chest not particular tender to palpation. Abdominal exam benign.  Chest x-ray positive for persistent shadow though somewhat improved    Assessment & Plan:  impression recurrence of pleuritic chest pain left side. See Ms. winter. Felt to have pneumonia at that time.. Plan we also press on and did blood work and scan. Scan showed no pulmonary involvement. Symptomatic care discussed for probable chest wall pain. WSL

## 2013-06-03 ENCOUNTER — Other Ambulatory Visit: Payer: Self-pay | Admitting: *Deleted

## 2013-06-03 NOTE — Progress Notes (Signed)
Blood orders are in. CT chest scheduled for Fri the 23rd at 3:00. Pt notified and verbalized understanding.

## 2013-06-05 ENCOUNTER — Ambulatory Visit (HOSPITAL_COMMUNITY)
Admission: RE | Admit: 2013-06-05 | Discharge: 2013-06-05 | Disposition: A | Discharge status: Home / Self Care | Payer: Medicare Other | Source: Ambulatory Visit | Attending: Family Medicine | Admitting: Family Medicine

## 2013-06-05 DIAGNOSIS — R0602 Shortness of breath: Secondary | ICD-10-CM | POA: Insufficient documentation

## 2013-06-05 DIAGNOSIS — R079 Chest pain, unspecified: Secondary | ICD-10-CM | POA: Diagnosis not present

## 2013-06-05 DIAGNOSIS — E041 Nontoxic single thyroid nodule: Secondary | ICD-10-CM | POA: Diagnosis not present

## 2013-06-05 LAB — POCT I-STAT CREATININE: Creatinine, Ser: 0.8 mg/dL (ref 0.50–1.10)

## 2013-06-05 MED ORDER — SODIUM CHLORIDE 0.9 % IJ SOLN
INTRAMUSCULAR | Status: AC
  Filled 2013-06-05: qty 750

## 2013-06-05 MED ORDER — IOHEXOL 300 MG/ML  SOLN
80.0000 mL | Freq: Once | INTRAMUSCULAR | Status: AC | PRN
Start: 2013-06-05 — End: 2013-06-05
  Administered 2013-06-05: 80 mL via INTRAVENOUS

## 2013-06-08 ENCOUNTER — Telehealth: Payer: Self-pay | Admitting: Family Medicine

## 2013-06-08 NOTE — Telephone Encounter (Signed)
Would like results of her CT Scan she had on Friday. [phone]

## 2013-06-08 NOTE — Telephone Encounter (Signed)
i signed off on fri and gave a message--i guess y'all didn't get a chance to call pt, see report under either scan or blood work

## 2013-06-08 NOTE — Telephone Encounter (Signed)
Discussed results with patient

## 2013-06-12 ENCOUNTER — Telehealth: Payer: Self-pay | Admitting: Family Medicine

## 2013-06-12 NOTE — Telephone Encounter (Signed)
Patient needs Rx for shingles vaccine

## 2013-06-12 NOTE — Telephone Encounter (Signed)
Pt notified shingles vaccine ready for pickup.

## 2013-06-20 ENCOUNTER — Other Ambulatory Visit: Payer: Self-pay | Admitting: Family Medicine

## 2013-06-22 ENCOUNTER — Other Ambulatory Visit: Payer: Self-pay | Admitting: Family Medicine

## 2013-07-04 ENCOUNTER — Other Ambulatory Visit: Payer: Self-pay | Admitting: Family Medicine

## 2013-07-07 ENCOUNTER — Other Ambulatory Visit: Payer: Self-pay | Admitting: Family Medicine

## 2013-07-27 ENCOUNTER — Telehealth: Payer: Self-pay | Admitting: Family Medicine

## 2013-07-27 NOTE — Telephone Encounter (Signed)
Rx up front for patient pick up. Patient notified. 

## 2013-07-27 NOTE — Telephone Encounter (Signed)
jobst hose or equivalent 30 mm hg below the knee, dx: venous stasis

## 2013-07-27 NOTE — Telephone Encounter (Signed)
Patient is requesting an Rx for support hose. She would like a hard copy so she can find out who has in stock.

## 2013-08-26 ENCOUNTER — Other Ambulatory Visit: Payer: Self-pay | Admitting: Family Medicine

## 2013-09-20 ENCOUNTER — Other Ambulatory Visit: Payer: Self-pay | Admitting: Family Medicine

## 2013-09-28 ENCOUNTER — Ambulatory Visit (INDEPENDENT_AMBULATORY_CARE_PROVIDER_SITE_OTHER): Payer: Medicare Other | Admitting: Family Medicine

## 2013-09-28 ENCOUNTER — Encounter: Payer: Self-pay | Admitting: Family Medicine

## 2013-09-28 VITALS — BP 150/70 | Temp 98.3°F | Ht 63.0 in | Wt 195.0 lb

## 2013-09-28 DIAGNOSIS — R1011 Right upper quadrant pain: Secondary | ICD-10-CM | POA: Diagnosis not present

## 2013-09-28 DIAGNOSIS — K2970 Gastritis, unspecified, without bleeding: Secondary | ICD-10-CM | POA: Insufficient documentation

## 2013-09-28 DIAGNOSIS — K2990 Gastroduodenitis, unspecified, without bleeding: Secondary | ICD-10-CM | POA: Diagnosis not present

## 2013-09-28 LAB — POCT URINALYSIS DIPSTICK
Blood, UA: POSITIVE
SPEC GRAV UA: 1.025
pH, UA: 5

## 2013-09-28 MED ORDER — PANTOPRAZOLE SODIUM 40 MG PO TBEC: 40.0000 mg | DELAYED_RELEASE_TABLET | Freq: Every day | ORAL | Status: DC

## 2013-09-28 NOTE — Progress Notes (Signed)
   Subjective:    Patient ID: [name], female    DOB: 11-19-1940, 73 y.o.   MRN: [id number]  Abdominal Pain This is a new problem. Episode onset: Friday evening  The problem occurs intermittently. The problem has been waxing and waning. The pain is located in the RUQ. The quality of the pain is dull (Soreness and numbness). The abdominal pain radiates to the right flank. The pain is aggravated by movement. The pain is relieved by being still. She has tried antacids for the symptoms. The treatment provided moderate relief.   Not assoxi with meals  Felt achey  No hx of heartburn  tums did nelp  Takes just one asa a day, took amox with the dentist  Still a bit sore, Worse with motion   No nausea, no noct symtoms, numb and sore persisiting  Patient has distant relative with pancreatic cancer. This concerns her very much. On further history numbness at right lateral rib cage has been going on for years. First started after back surgery.  No nocturnal pain. Definitely improved with TUMS.  Daily aspirin. Recent initiated by neurologist with family history of stroke and numerous risk factors.   Review of Systems  Gastrointestinal: Positive for abdominal pain.   no weight loss no weight gain no fever no chills no change in bowel habits     Objective:   Physical Exam  Alert moderate malaise. H&T normal. Lungs clear. Heart regular in rhythm. Epigastrium tender to palpation. Right upper quadrant tenderness. No rebound good bowel sounds no CVA tenderness.      Assessment & Plan:  Impression probable gastritis/duodenitis with aspirin. Indications for aspirin are good so will maintain. Add daily protonic. Rationale discussed. Many questions answered. Patient worried about cancer. Plan followup in one month. Protonic stable he [place]. Warning signs discussed for 25 minutes spent most in discussion. WSL

## 2013-10-30 ENCOUNTER — Encounter: Payer: Self-pay | Admitting: Family Medicine

## 2013-10-30 ENCOUNTER — Ambulatory Visit (INDEPENDENT_AMBULATORY_CARE_PROVIDER_SITE_OTHER): Payer: Medicare Other | Admitting: Family Medicine

## 2013-10-30 VITALS — BP 178/70 | Ht 63.0 in | Wt 198.0 lb

## 2013-10-30 DIAGNOSIS — K2990 Gastroduodenitis, unspecified, without bleeding: Secondary | ICD-10-CM | POA: Diagnosis not present

## 2013-10-30 DIAGNOSIS — I10 Essential (primary) hypertension: Secondary | ICD-10-CM | POA: Diagnosis not present

## 2013-10-30 DIAGNOSIS — K2970 Gastritis, unspecified, without bleeding: Secondary | ICD-10-CM

## 2013-10-30 MED ORDER — CLONIDINE HCL 0.3 MG PO TABS: ORAL_TABLET | ORAL | Status: DC

## 2013-10-30 MED ORDER — PANTOPRAZOLE SODIUM 40 MG PO TBEC: 40.0000 mg | DELAYED_RELEASE_TABLET | Freq: Every day | ORAL | Status: DC

## 2013-10-30 NOTE — Progress Notes (Signed)
   Subjective:    Patient ID: [name], female    DOB: [birth year], 73 y.o.   MRN: [id number]  HPI Patient is here today for a 1 month follow up on her abdominal pain.  She said she feels, "wonderful" since she started taking the Protonix. She no longer has abdominal pain.  abd pain now gone, mucn oimproved on the protnix  Now sig better.  Pt has no concerns. Patient claims compliance with blood pressure medication.    Review of Systems No chest pain no headache no back pain ROS otherwise negative    Objective:   Physical Exam Blood pressure 165/90 on repeat. HEENT normal. Lungs clear. Heart regular in rhythm. Abdomen epigastric tenderness resolved       Assessment & Plan:  Impression gastritis resolved #2 hypertension suboptimal in discussed plan increase clonidine to 0.3 twice a day. Maintain Protonix. Diet exercise discussed recheck as scheduled. WSL

## 2013-11-02 DIAGNOSIS — H2510 Age-related nuclear cataract, unspecified eye: Secondary | ICD-10-CM | POA: Diagnosis not present

## 2013-11-11 ENCOUNTER — Telehealth: Payer: Self-pay | Admitting: Family Medicine

## 2013-11-11 ENCOUNTER — Other Ambulatory Visit: Payer: Self-pay

## 2013-11-11 MED ORDER — CLONIDINE HCL 0.2 MG PO TABS: 0.2000 mg | ORAL_TABLET | Freq: Two times a day (BID) | ORAL | Status: DC

## 2013-11-11 NOTE — Telephone Encounter (Signed)
Patient said that there was an increase on her clonidine and ever since, she is unable to sleep and a slight headache. She doesn't believe she will be able to tolerate this med.  [facility] [place]

## 2013-11-11 NOTE — Telephone Encounter (Signed)
This is the fifth bp active med thius pt is on, so all choices going forward will give s e 's. Go back down to 0.2 mg bid and re ck in office in one mo

## 2013-11-11 NOTE — Telephone Encounter (Signed)
Notified patient this is the fifth blood pressure active med she has been on, so all choices going forward will give side effects. Go back down to 0.2 mg bid and recheck in office in one month. Patient verbalized understanding. Patient did not need prescription sent in, she already had some 0.2 mg tabs at home.

## 2013-11-18 ENCOUNTER — Encounter: Payer: Self-pay | Admitting: Family Medicine

## 2013-11-18 ENCOUNTER — Ambulatory Visit (INDEPENDENT_AMBULATORY_CARE_PROVIDER_SITE_OTHER): Payer: Medicare Other | Admitting: Family Medicine

## 2013-11-18 VITALS — BP 172/80 | Temp 98.6°F | Ht 63.0 in | Wt 192.0 lb

## 2013-11-18 DIAGNOSIS — I10 Essential (primary) hypertension: Secondary | ICD-10-CM | POA: Diagnosis not present

## 2013-11-18 DIAGNOSIS — R197 Diarrhea, unspecified: Secondary | ICD-10-CM

## 2013-11-18 DIAGNOSIS — K529 Noninfective gastroenteritis and colitis, unspecified: Secondary | ICD-10-CM

## 2013-11-18 MED ORDER — RANITIDINE HCL 300 MG PO TABS: 300.0000 mg | ORAL_TABLET | Freq: Every day | ORAL | Status: DC

## 2013-11-18 NOTE — Progress Notes (Signed)
   Subjective:    Patient ID: [name], female    DOB: Feb 21, 1941, 73 y.o.   MRN: [id number]  Diarrhea  This is a new problem. The current episode started 1 to 4 weeks ago. The stool consistency is described as watery. The patient states that diarrhea awakens her from sleep. Associated symptoms comments: Leg cramps. Exacerbated by: fruits and vegetables. She has tried increased fluids for the symptoms.  Patient states she thinks the pantoprazole is causing the diarrhea. Started med about 2 months ago. Diarrhea started about 1 week ago.   Reg freq loose stols, very over regular  Patient now on yet a fourth blood pressure medication. She claims complete compliance with these medicines. No obvious side effects. No drowsiness. No headache or chest pain.  Review of Systems  Gastrointestinal: Positive for diarrhea.   see above ROS otherwise negative     Objective:   Physical Exam  Alert no acute distress 156 or 100 on repeat lungs clear. Heart rare rhythm. Abdominal exam benign. Ankles without edema.      Assessment & Plan:  Impression 1 hypertension on multiple meds unable to control. Discussed. #2 loose stools with proton pump inhibitors discussed plan initiate Pepcid. Stop proton pump inhibitor. Cardiology referral. Rationale discussed. WSL

## 2013-11-19 ENCOUNTER — Telehealth: Payer: Self-pay | Admitting: Family Medicine

## 2013-11-19 MED ORDER — FAMOTIDINE 40 MG PO TABS: 40.0000 mg | ORAL_TABLET | Freq: Every day | ORAL | Status: DC

## 2013-11-19 NOTE — Telephone Encounter (Signed)
Patient was seen yesterday and was prescribe zantac 300. She states it makes her heart race and really nerve. Can you call in something else to [facility].

## 2013-11-19 NOTE — Telephone Encounter (Signed)
Patient notified and verbalized understanding. 

## 2013-11-19 NOTE — Addendum Note (Signed)
Addended byCharolotte Capuchin D on: 11/19/2013 11:23 AM   Modules accepted: Orders, Medications

## 2013-11-19 NOTE — Telephone Encounter (Signed)
Wow, based on one dose?? pepcid 40 daily 30 11 ref

## 2013-11-27 ENCOUNTER — Other Ambulatory Visit: Payer: Self-pay | Admitting: Family Medicine

## 2013-11-30 ENCOUNTER — Ambulatory Visit: Payer: Medicare Other | Admitting: Internal Medicine

## 2013-12-09 ENCOUNTER — Telehealth: Payer: Self-pay | Admitting: Cardiology

## 2013-12-09 ENCOUNTER — Telehealth: Payer: Self-pay | Admitting: Family Medicine

## 2013-12-09 ENCOUNTER — Encounter: Payer: Self-pay | Admitting: Cardiology

## 2013-12-09 ENCOUNTER — Other Ambulatory Visit: Payer: Self-pay

## 2013-12-09 ENCOUNTER — Ambulatory Visit (INDEPENDENT_AMBULATORY_CARE_PROVIDER_SITE_OTHER): Payer: Medicare Other | Admitting: Cardiology

## 2013-12-09 VITALS — BP 160/62 | HR 92 | Ht 63.0 in | Wt 196.0 lb

## 2013-12-09 DIAGNOSIS — R011 Cardiac murmur, unspecified: Secondary | ICD-10-CM | POA: Diagnosis not present

## 2013-12-09 DIAGNOSIS — I10 Essential (primary) hypertension: Secondary | ICD-10-CM | POA: Diagnosis not present

## 2013-12-09 LAB — CBC
HEMATOCRIT: 37.8 % (ref 36.0–46.0)
Hemoglobin: 12.8 g/dL (ref 12.0–15.0)
MCH: 29.6 pg (ref 26.0–34.0)
MCHC: 33.9 g/dL (ref 30.0–36.0)
MCV: 87.3 fL (ref 78.0–100.0)
PLATELETS: 287 10*3/uL (ref 150–400)
RBC: 4.33 MIL/uL (ref 3.87–5.11)
RDW: 13.5 % (ref 11.5–15.5)
WBC: 6.2 10*3/uL (ref 4.0–10.5)

## 2013-12-09 LAB — BASIC METABOLIC PANEL
BUN: 18 mg/dL (ref 6–23)
CHLORIDE: 105 meq/L (ref 96–112)
CO2: 26 mEq/L (ref 19–32)
Calcium: 9.2 mg/dL (ref 8.4–10.5)
Creat: 0.78 mg/dL (ref 0.50–1.10)
GLUCOSE: 128 mg/dL — AB (ref 70–99)
POTASSIUM: 4.2 meq/L (ref 3.5–5.3)
SODIUM: 139 meq/L (ref 135–145)

## 2013-12-09 LAB — TSH: TSH: 0.338 u[IU]/mL — AB (ref 0.350–4.500)

## 2013-12-09 MED ORDER — CHLORTHALIDONE 50 MG PO TABS: 50.0000 mg | ORAL_TABLET | Freq: Every day | ORAL | Status: DC

## 2013-12-09 NOTE — Progress Notes (Signed)
Clinical Summary [name] is a 73 y.o.female seen today as a new patient.    1. HTN - reports diagnosed at age 9, reports has always been difficult to control - on multiple agents - checks bp at home occasionally, typically around 140s/60s per her report.  She does not sit still for 5 minutes prior to checking at home - denies any symptoms with her elevated blood pressurs.  - currently on amlodopine/benazepril 10/40, clonidine 0.2 mg bid, cardura 8mg  qHS, lasix 40mg . On higher clonidine dose gave dry mouth and nightmares.  - no recent labs. 04/2013 K 4.7, Cr 0.76  Past Medical History  Diagnosis Date  . Hypertension   . Arthritis   . Asthma   . Allergic rhinitis   . IFG (impaired fasting glucose)   . Osteopenia   . Spinal stenosis      Allergies  Allergen Reactions  . Levaquin [Levofloxacin In D5w]     Sleep disturbance  . Detrol [Tolterodine]     Abdominal pain     Current Outpatient Prescriptions  Medication Sig Dispense Refill  . amLODipine-benazepril (LOTREL) 10-40 MG per capsule TAKE 1 CAPSULE DAILY  90 capsule  1  . aspirin EC 81 MG tablet Take 81 mg by mouth daily.      . calcium carbonate (TUMS EX) 750 MG chewable tablet Chew 1 tablet by mouth daily as needed. indegestion      . Calcium Carbonate-Vit D-Min (CALCIUM 1200 PO) Take 1 tablet by mouth 2 (two) times daily.       . carboxymethylcellulose (REFRESH PLUS) 0.5 % SOLN Place 1 drop into both eyes 2 (two) times daily as needed. Dry eyes      . cloNIDine (CATAPRES) 0.2 MG tablet Take 1 tablet (0.2 mg total) by mouth 2 (two) times daily.  180 tablet  1  . doxazosin (CARDURA) 8 MG tablet TAKE 1 TABLET AT BEDTIME  90 tablet  1  . famotidine (PEPCID) 40 MG tablet Take 1 tablet (40 mg total) by mouth daily.  30 tablet  11  . furosemide (LASIX) 40 MG tablet TAKE 1 TABLET DAILY  90 tablet  1  . KLOR-CON M20 20 MEQ tablet TAKE 1 TABLET DAILY  90 tablet  1  . traMADol (ULTRAM) 50 MG tablet Take 1 tablet (50  mg total) by mouth every 6 (six) hours as needed for pain.  360 tablet  2  . triamcinolone cream (KENALOG) 0.1 % Apply topically 2 (two) times daily.  15 g  0  . VENTOLIN HFA 108 (90 BASE) MCG/ACT inhaler INHALE TWO PUFFS BY MOUTH EVERY 6 HOURS AS NEEDED FOR WHEEZING  18 each  5   No current facility-administered medications for this visit.     Past Surgical History  Procedure Laterality Date  . Abdominal hysterectomy    . Total abdominal hysterectomy w/ bilateral salpingoophorectomy    . Cholecystectomy    . Back surgery    . Knee arthroscopy Left      Allergies  Allergen Reactions  . Levaquin [Levofloxacin In D5w]     Sleep disturbance  . Detrol [Tolterodine]     Abdominal pain      Family History  Problem Relation Age of Onset  . Diabetes Mother   . Hypertension Mother   . Diabetes Father   . Hypertension Father      Social History [name] reports that she has never smoked. She has never used smokeless tobacco. [name]  has no alcohol history on file.   Review of Systems CONSTITUTIONAL: No weight loss, fever, chills, weakness or fatigue.  HEENT: Eyes: No visual loss, blurred vision, double vision or yellow sclerae.No hearing loss, sneezing, congestion, runny nose or sore throat.  SKIN: No rash or itching.  CARDIOVASCULAR: no chest pain, no palpitations RESPIRATORY: No shortness of breath, cough or sputum.  GASTROINTESTINAL: No anorexia, nausea, vomiting or diarrhea. No abdominal pain or blood.  GENITOURINARY: No burning on urination, no polyuria NEUROLOGICAL: No headache, dizziness, syncope, paralysis, ataxia, numbness or tingling in the extremities. No change in bowel or bladder control.  MUSCULOSKELETAL: No muscle, back pain, joint pain or stiffness.  LYMPHATICS: No enlarged nodes. No history of splenectomy.  PSYCHIATRIC: No history of depression or anxiety.  ENDOCRINOLOGIC: No reports of sweating, cold or heat intolerance. No polyuria or  polydipsia.  Marland Kitchen   Physical Examination p 92 bp 175/80 Wt 196 lbs BMI 35 Gen: resting comfortably, no acute distress HEENT: no scleral icterus, pupils equal round and reactive, no palptable cervical adenopathy,  CV: RRR, 3/6 systolic murmur RUSB, no JVD Resp: Clear to auscultation bilaterally GI: abdomen is soft, non-tender, non-distended, normal bowel sounds, no hepatosplenomegaly MSK: extremities are warm, no edema.  Skin: warm, no rash Neuro:  no focal deficits Psych: appropriate affect    Assessment and Plan  1. HTN - history of difficult to control HTN, has been started on multiple agents by pcp without reported control - discrepancy between her home reported numbers and our in clinic numbers, she reports possible history of white coat HTN - she will keep bp log until next visit, educated to take bp after taking medications and after sitting still for 5 minutes. She will bring her home bp cuff in to compare with our cuff next visit - will stop lasix to more bp effective thiazide diuretic, start chlorthalidone 50mg  daily - check BMET, TSH for now - pending results and response, consider workup for primary HTN including renin/aldo, renal artery [place], OSA screening at next visit.   2. Heart murmur - order echo at next visit  F/u 4 weeks  Arnoldo Lenis, M.D., F.A.C.C.

## 2013-12-09 NOTE — Telephone Encounter (Signed)
Has chronic diarrhea.  Was seen on 7/8.  Plan:  loose stools with proton pump inhibitors discussed plan initiate Pepcid. Stop proton pump inhibitor.

## 2013-12-09 NOTE — Telephone Encounter (Signed)
Patient said that she has been taking her famotidine (PEPCID) 40 MG tablet as directed, but it has progressively given her diarrhea that keeps getting worse. She wants to know if she can take this medicine as needed because all the medications that she is put on, she has a reaction to.

## 2013-12-09 NOTE — Addendum Note (Signed)
Addended by: Barbarann Ehlers A on: 12/09/2013 11:46 AM   Modules accepted: Orders

## 2013-12-09 NOTE — Telephone Encounter (Signed)
Stop pepcid, prn tums or mylanta and ov mid next wk

## 2013-12-09 NOTE — Patient Instructions (Signed)
Your physician recommends that you schedule a follow-up appointment in: 5-6 weeks    Your physician has recommended you make the following change in your medication:    STOP lasix   START Chlorthalidone 50 mg daily    Please bring BP log and cuff to next appointment  Please get blood work (BMET,CBC,TSH)     Thank you for choosing [facility] !

## 2013-12-09 NOTE — Telephone Encounter (Signed)
Pt switched today from lasix to chlorithiadone for better BP control. Pt reports being hesitant to make switch but took 1/2 tab as test. Reports feeling shaking, dry mouth and can't tolerate the medicine. Recommended return to lasix though I can't be sure that the low dose chlorothiadone is responsible but she is convinced. FYI to Dr. Harl Bowie.

## 2013-12-09 NOTE — Telephone Encounter (Signed)
Patient notified and verbalized understanding. Transferred up front to make appt mid next week per Dr. Richardson Landry

## 2013-12-15 ENCOUNTER — Other Ambulatory Visit: Payer: Self-pay | Admitting: Family Medicine

## 2013-12-15 ENCOUNTER — Telehealth: Payer: Self-pay

## 2013-12-15 DIAGNOSIS — E0590 Thyrotoxicosis, unspecified without thyrotoxic crisis or storm: Secondary | ICD-10-CM | POA: Diagnosis not present

## 2013-12-15 NOTE — Telephone Encounter (Signed)
Message copied by Bernita Raisin on Tue Dec 15, 2013  4:18 PM ------      Message from: [employer] F      Created: Tue Dec 15, 2013  4:03 PM       Labs show her thyroid function may be elevated. Please see if can add on a regular T3 and a Free T4 to sample, if not repeat lab draw. If goes for repeat draw order renin/aldo ratio.                  Zandra Abts MD ------

## 2013-12-15 NOTE — Telephone Encounter (Signed)
Lasix was DC'd by Dr. Cletus Gash on 7/29:  "Pt switched today from lasix to chlorithiadone for better BP control. Pt reports being hesitant to make switch but took 1/2 tab as test. Reports feeling shaking, dry mouth and can't tolerate the medicine. Recommended return to lasix though I can't be sure that the low dose chlorothiadone is responsible but she is convinced. FYI to Dr. Harl Bowie."  Do you want me to refill the Lasix? Pt has an appt with you on 8/6.

## 2013-12-15 NOTE — Telephone Encounter (Signed)
Pt will get labs in the am

## 2013-12-17 ENCOUNTER — Encounter: Payer: Self-pay | Admitting: Family Medicine

## 2013-12-17 ENCOUNTER — Ambulatory Visit (INDEPENDENT_AMBULATORY_CARE_PROVIDER_SITE_OTHER): Payer: Medicare Other | Admitting: Family Medicine

## 2013-12-17 ENCOUNTER — Other Ambulatory Visit: Payer: Self-pay | Admitting: Family Medicine

## 2013-12-17 VITALS — BP 134/72 | Ht 63.0 in | Wt 193.0 lb

## 2013-12-17 DIAGNOSIS — K2990 Gastroduodenitis, unspecified, without bleeding: Secondary | ICD-10-CM

## 2013-12-17 DIAGNOSIS — I10 Essential (primary) hypertension: Secondary | ICD-10-CM

## 2013-12-17 DIAGNOSIS — K2970 Gastritis, unspecified, without bleeding: Secondary | ICD-10-CM

## 2013-12-17 DIAGNOSIS — Z1231 Encounter for screening mammogram for malignant neoplasm of breast: Secondary | ICD-10-CM

## 2013-12-17 DIAGNOSIS — H9201 Otalgia, right ear: Secondary | ICD-10-CM

## 2013-12-17 DIAGNOSIS — H9209 Otalgia, unspecified ear: Secondary | ICD-10-CM | POA: Diagnosis not present

## 2013-12-17 LAB — T3: T3 TOTAL: 116.8 ng/dL (ref 80.0–204.0)

## 2013-12-17 LAB — T4, FREE: Free T4: 1.07 ng/dL (ref 0.80–1.80)

## 2013-12-17 NOTE — Progress Notes (Signed)
   Subjective:    Patient ID: [name], female    DOB: [birth year], 73 y.o.   MRN: [id number]  HPIRecheck on diarrhea. Pt states since stopping pepcid she has not had diarrhea. Patient notes her reflux has not returned. Also gastritis is return. This needs the patient had a reaction to 3 GI medications. Protonic next than ranitidine then Pepcid. This is very uncommon discussed with patient  Right ear pain and ringing in ear. Noticed some discomfort . No major congestion discomfort.  Ongoing challenges with high blood pressure. Some adjustments have been made by the specialist. See their notes.    Had bloodwork for thyroid done 12/15/13. Ordered by Dr. Harl Bowie.  Curious about the results.     Review of Systems No headache no cough no fever no chest pain no abdominal pain ROS otherwise negative    Objective:   Physical Exam  Alert no acute distress vitals stable. Lungs clear. Heart rare rhythm. Abdominal exam benign. HEENT within normal limits     Assessment & Plan:  Impression 1 persistent diarrhea now resolved apparently do to GI medications according to patient. See present illness #2 hypertension controlled bleeding. Discussed. #3 thyroid concerns discussed. #4 otalgia no obvious etiology on exam discussed plan maintain same meds. Diet exercise discussed. Recheck as scheduled. WSL

## 2013-12-21 ENCOUNTER — Telehealth: Payer: Self-pay | Admitting: Cardiology

## 2013-12-21 NOTE — Telephone Encounter (Signed)
Made pt aware that blood work so far was normal still waiting for one more result that is pending.

## 2013-12-21 NOTE — Telephone Encounter (Signed)
Lab test results / tgs

## 2013-12-22 NOTE — Telephone Encounter (Signed)
Notified pt contact of results of thyroid and pt contact says he would have her call when she got in wasn't sure if she went back to lab for re draw for aldo to renin ratio

## 2013-12-24 LAB — ALDOSTERONE + RENIN ACTIVITY W/ RATIO
ALDO / PRA RATIO: 0.1 ratio — AB (ref 0.9–28.9)
Aldosterone: 5 ng/dL
PRA LC/MS/MS: 52.33 ng/mL/h — AB (ref 0.25–5.82)

## 2013-12-31 ENCOUNTER — Encounter: Payer: Self-pay | Admitting: Family Medicine

## 2013-12-31 ENCOUNTER — Ambulatory Visit (INDEPENDENT_AMBULATORY_CARE_PROVIDER_SITE_OTHER): Payer: Medicare Other | Admitting: Family Medicine

## 2013-12-31 VITALS — BP 140/72 | Temp 98.0°F | Ht 63.0 in | Wt 193.4 lb

## 2013-12-31 DIAGNOSIS — R197 Diarrhea, unspecified: Secondary | ICD-10-CM | POA: Diagnosis not present

## 2013-12-31 DIAGNOSIS — I10 Essential (primary) hypertension: Secondary | ICD-10-CM

## 2013-12-31 MED ORDER — HYOSCYAMINE SULFATE 0.125 MG SL SUBL: 0.1250 mg | SUBLINGUAL_TABLET | Freq: Three times a day (TID) | SUBLINGUAL | Status: DC | PRN

## 2013-12-31 NOTE — Progress Notes (Signed)
   Subjective:    Patient ID: [name], female    DOB: 02-12-1941, 73 y.o.   MRN: [id number]  Diarrhea  This is a new problem. The current episode started more than 1 month ago. The problem has been unchanged. The stool consistency is described as watery. Associated symptoms comments: shaky. Nothing aggravates the symptoms. There are no known risk factors. The treatment provided no relief.  Patient has diarrhea after eating and also feels shaky after eating a meal.   Patient states that she has no other concerns at this time.  Patient states she needs a prescription for a walker.   Explosive diarrhea, loose stools. When this occurs often accompanied by weakness.  In patient and planned blood pressure medicine. Blood pressure numbers good at home. At times even somewhat low. Notes blood pressure more elevated when at the doctor's office.  Protracted fatigue during days when there is substantial diarrhea.  On further history stool pattern is days of constipation alternating with days of loose stools.   No pain no cramping gets a rumbling and then diarrhea   Review of Systems  Gastrointestinal: Positive for diarrhea.   No vomiting no headache no chest pain no weight loss no change in bowel habits last colonoscopy 5 years ago. None due for 10 years.    Objective:   Physical Exam  Alert no apparent distress blood pressure 146/88 on repeat. HEENT normal. Lungs clear. Heart rare in rhythm. Abdomen hyperactive bowel sounds no discrete tenderness rebound no guarding no masses      Assessment & Plan:  Impression diarrhea associated with fatigue and lightheadedness. Discussed. Potential vasovagal component. Also concave by multiple blood pressure medicines all discussed. #2 probable IBS with alternating stools #3 hypertension decent control also discussed plan Levsin when necessary for loose stools. Start FiberCon 2 tablets twice a day rationale discussed. Followup with cardiologist as  set up. WSL

## 2013-12-31 NOTE — Patient Instructions (Signed)
fibercon two tabs twice per day long term--worth a try

## 2014-01-08 ENCOUNTER — Ambulatory Visit (HOSPITAL_COMMUNITY)
Admission: RE | Admit: 2014-01-08 | Discharge: 2014-01-08 | Disposition: A | Discharge status: Home / Self Care | Payer: Medicare Other | Source: Ambulatory Visit | Attending: Family Medicine | Admitting: Family Medicine

## 2014-01-08 DIAGNOSIS — Z1231 Encounter for screening mammogram for malignant neoplasm of breast: Secondary | ICD-10-CM | POA: Insufficient documentation

## 2014-01-20 ENCOUNTER — Encounter: Payer: Self-pay | Admitting: Cardiology

## 2014-01-20 ENCOUNTER — Ambulatory Visit (INDEPENDENT_AMBULATORY_CARE_PROVIDER_SITE_OTHER): Payer: Medicare Other | Admitting: Cardiology

## 2014-01-20 VITALS — BP 185/70 | HR 102 | Ht 63.0 in | Wt 195.0 lb

## 2014-01-20 DIAGNOSIS — I10 Essential (primary) hypertension: Secondary | ICD-10-CM

## 2014-01-20 DIAGNOSIS — R011 Cardiac murmur, unspecified: Secondary | ICD-10-CM

## 2014-01-20 LAB — BASIC METABOLIC PANEL
BUN: 24 mg/dL — AB (ref 6–23)
CHLORIDE: 103 meq/L (ref 96–112)
CO2: 25 meq/L (ref 19–32)
Calcium: 9.9 mg/dL (ref 8.4–10.5)
Creat: 0.93 mg/dL (ref 0.50–1.10)
GLUCOSE: 150 mg/dL — AB (ref 70–99)
POTASSIUM: 3.6 meq/L (ref 3.5–5.3)
SODIUM: 138 meq/L (ref 135–145)

## 2014-01-20 MED ORDER — CHLORTHALIDONE 50 MG PO TABS: 50.0000 mg | ORAL_TABLET | Freq: Every day | ORAL | Status: DC

## 2014-01-20 NOTE — Patient Instructions (Signed)
Your physician wants you to follow-up in: 6 months You will receive a reminder letter in the mail two months in advance. If you don't receive a letter, please call our office to schedule the follow-up appointment.    Your physician recommends that you continue on your current medications as directed. Please refer to the Current Medication list given to you today.     Your physician has requested that you have an echocardiogram. Echocardiography is a painless test that uses sound waves to create images of your heart. It provides your doctor with information about the size and shape of your heart and how well your heart's chambers and valves are working. This procedure takes approximately one hour. There are no restrictions for this procedure.     Please get lab work (BMET)     Please keep BP log and bring at next visit

## 2014-01-20 NOTE — Progress Notes (Signed)
Clinical Summary Ms. Goodnow is a 73 y.o.female seen today for follow up of the following medical problems.  1. HTN  - reports diagnosed at age 53, she states has always been difficult to control  - checks bp at home occasionally, typically around 120-140s/60-70s, occasional high 90s/60s. - compliant with meds, though has not taken yet today  - currently on amlodopine/benazepril 10/40, clonidine 0.2 mg bid, cardura 8mg  qHS, lasix 40mg . On higher clonidine dose gave dry mouth and nightmares.  - initially she was concerned about dizziness from starting chlorthalidone, but this resolved, she now attributes this to an inner ear infection   Past Medical History  Diagnosis Date  . Hypertension   . Arthritis   . Asthma   . Allergic rhinitis   . IFG (impaired fasting glucose)   . Osteopenia   . Spinal stenosis      Allergies  Allergen Reactions  . Levaquin [Levofloxacin In D5w]     Sleep disturbance  . Detrol [Tolterodine]     Abdominal pain     Current Outpatient Prescriptions  Medication Sig Dispense Refill  . amLODipine-benazepril (LOTREL) 10-40 MG per capsule TAKE 1 CAPSULE DAILY  90 capsule  1  . aspirin EC 81 MG tablet Take 81 mg by mouth daily.      . Calcium Carbonate-Vit D-Min (CALCIUM 1200 PO) Take 1 tablet by mouth 2 (two) times daily.       . carboxymethylcellulose (REFRESH PLUS) 0.5 % SOLN Place 1 drop into both eyes 2 (two) times daily as needed. Dry eyes      . chlorthalidone (HYGROTON) 50 MG tablet Take 1 tablet (50 mg total) by mouth daily.  90 tablet  3  . cloNIDine (CATAPRES) 0.2 MG tablet Take 1 tablet (0.2 mg total) by mouth 2 (two) times daily.  180 tablet  1  . doxazosin (CARDURA) 8 MG tablet TAKE 1 TABLET AT BEDTIME  90 tablet  1  . hyoscyamine (LEVSIN/SL) 0.125 MG SL tablet Place 1 tablet (0.125 mg total) under the tongue every 8 (eight) hours as needed.  30 tablet  3  . KLOR-CON M20 20 MEQ tablet TAKE 1 TABLET DAILY  90 tablet  1  . traMADol  (ULTRAM) 50 MG tablet Take 1 tablet (50 mg total) by mouth every 6 (six) hours as needed for pain.  360 tablet  2  . triamcinolone cream (KENALOG) 0.1 % Apply topically 2 (two) times daily.  15 g  0  . VENTOLIN HFA 108 (90 BASE) MCG/ACT inhaler INHALE TWO PUFFS BY MOUTH EVERY 6 HOURS AS NEEDED FOR WHEEZING  18 each  5   No current facility-administered medications for this visit.     Past Surgical History  Procedure Laterality Date  . Abdominal hysterectomy    . Total abdominal hysterectomy w/ bilateral salpingoophorectomy    . Cholecystectomy    . Back surgery    . Knee arthroscopy Left      Allergies  Allergen Reactions  . Levaquin [Levofloxacin In D5w]     Sleep disturbance  . Detrol [Tolterodine]     Abdominal pain      Family History  Problem Relation Age of Onset  . Diabetes Mother   . Hypertension Mother   . Diabetes Father   . Hypertension Father      Social History Ms. Bohlken reports that she has never smoked. She has never used smokeless tobacco. Ms. Parma has no alcohol history on file.   Review  of Systems CONSTITUTIONAL: No weight loss, fever, chills, weakness or fatigue.  HEENT: Eyes: No visual loss, blurred vision, double vision or yellow sclerae.No hearing loss, sneezing, congestion, runny nose or sore throat.  SKIN: No rash or itching.  CARDIOVASCULAR:  RESPIRATORY: No shortness of breath, cough or sputum.  GASTROINTESTINAL: No anorexia, nausea, vomiting or diarrhea. No abdominal pain or blood.  GENITOURINARY: No burning on urination, no polyuria NEUROLOGICAL: No headache, dizziness, syncope, paralysis, ataxia, numbness or tingling in the extremities. No change in bowel or bladder control.  MUSCULOSKELETAL: No muscle, back pain, joint pain or stiffness.  LYMPHATICS: No enlarged nodes. No history of splenectomy.  PSYCHIATRIC: No history of depression or anxiety.  ENDOCRINOLOGIC: No reports of sweating, cold or heat intolerance. No polyuria or  polydipsia.  Marland Kitchen   Physical Examination p 102 bp 185/70 Wt 195 lbs BMI 35 Gen: resting comfortably, no acute distress HEENT: no scleral icterus, pupils equal round and reactive, no palptable cervical adenopathy,  CV: RRR, 3/6 systolic murmur at apex, no JVD, no carotid bruits Resp: Clear to auscultation bilaterally GI: abdomen is soft, non-tender, non-distended, normal bowel sounds, no hepatosplenomegaly MSK: extremities are warm, no edema.  Skin: warm, no rash Neuro:  no focal deficits Psych: appropriate affect   Diagnostic Studies     Assessment and Plan  1. HTN  - history of difficult to control HTN, has been started on multiple agents by pcp  - Elevated in clinic today but she has not taken her meds yet. Her home numbers are at goal. We correlated her home cuff with ours and values are similar. She does appear to have a component of white coat HTN. - from labs no clear cause of secondary HTN. Since controlled will not pursue further eval at this time (ie renal artery [place], sleep study) - continue current meds  2. Heart murmur  - order echo        Arnoldo Lenis, M.D., F.A.C.C.

## 2014-01-22 ENCOUNTER — Ambulatory Visit (HOSPITAL_COMMUNITY)
Admission: RE | Admit: 2014-01-22 | Discharge: 2014-01-22 | Disposition: A | Discharge status: Home / Self Care | Payer: Medicare Other | Source: Ambulatory Visit | Attending: Cardiology | Admitting: Cardiology

## 2014-01-22 DIAGNOSIS — I079 Rheumatic tricuspid valve disease, unspecified: Secondary | ICD-10-CM | POA: Insufficient documentation

## 2014-01-22 DIAGNOSIS — I10 Essential (primary) hypertension: Secondary | ICD-10-CM | POA: Diagnosis not present

## 2014-01-22 DIAGNOSIS — R011 Cardiac murmur, unspecified: Secondary | ICD-10-CM | POA: Diagnosis not present

## 2014-01-22 DIAGNOSIS — I369 Nonrheumatic tricuspid valve disorder, unspecified: Secondary | ICD-10-CM

## 2014-01-22 NOTE — Progress Notes (Signed)
  Echocardiogram 2D Echocardiogram has been performed.  [place], [place] 01/22/2014, 12:03 PM

## 2014-01-23 ENCOUNTER — Other Ambulatory Visit: Payer: Self-pay | Admitting: Family Medicine

## 2014-01-26 ENCOUNTER — Other Ambulatory Visit: Payer: Self-pay | Admitting: Family Medicine

## 2014-01-27 ENCOUNTER — Telehealth: Payer: Self-pay | Admitting: *Deleted

## 2014-01-27 NOTE — Telephone Encounter (Signed)
Message copied by Desma Mcgregor on Wed Jan 27, 2014 12:03 PM ------      Message from: [place] F      Created: Wed Jan 27, 2014  9:04 AM       Her echo looks good, her heart function is normal. It appears from the echo that she might be dehydrated. We should cut her chlorthalidone to 25mg  daily and she should drink more water regularly.            Zandra Abts MD ------

## 2014-01-27 NOTE — Telephone Encounter (Signed)
Pt notified and understood, already has the 50 mg will cut in half to take 25 mg daily. Forwarded to Dr. Wolfgang Phoenix. Updated medication list

## 2014-01-28 ENCOUNTER — Telehealth: Payer: Self-pay | Admitting: Family Medicine

## 2014-01-28 NOTE — Telephone Encounter (Signed)
Patient has an appointment on 02/02/14 for a follow up.  Patient said that at this time, she thinks she does not need to be seen because she is healthy and not needing any medication. She wanted to cancel the appointment for Tuesday with Dr. Richardson Landry.  Instead, she just wanted to get a flu shot that day.  I told her that we would ask the doctor before cancelling the appointment, but to plan to come in on 02/02/14 at 10:00am for either seeing Dr. Richardson Landry or the nurse.  Please let me know if the appointment needs to be cancelled with Dr. Richardson Landry and changed to nurses schedule for a flu shot.

## 2014-01-28 NOTE — Telephone Encounter (Signed)
That's fine --ov in three mo, unless sick before. May give flu

## 2014-01-29 NOTE — Telephone Encounter (Signed)
Discussed with pt that she can come in for flu vaccine only and ov with him in 3 months. Discussed with Pamala Hurry to change her on schedule to a nurse visit.

## 2014-01-29 NOTE — Telephone Encounter (Signed)
Done

## 2014-02-02 ENCOUNTER — Ambulatory Visit (INDEPENDENT_AMBULATORY_CARE_PROVIDER_SITE_OTHER): Payer: Medicare Other | Admitting: *Deleted

## 2014-02-02 DIAGNOSIS — Z23 Encounter for immunization: Secondary | ICD-10-CM

## 2014-02-20 ENCOUNTER — Other Ambulatory Visit: Payer: Self-pay | Admitting: Family Medicine

## 2014-03-02 ENCOUNTER — Telehealth: Payer: Self-pay | Admitting: Family Medicine

## 2014-03-02 NOTE — Telephone Encounter (Signed)
Patient has requested that a nurse give her a call back so that she can update her medication list.

## 2014-03-02 NOTE — Telephone Encounter (Signed)
Patient wanted to let you know that Dr. Harl Bowie stopped her Lasix.

## 2014-03-03 NOTE — Telephone Encounter (Signed)
ok 

## 2014-03-11 DIAGNOSIS — H5319 Other subjective visual disturbances: Secondary | ICD-10-CM | POA: Diagnosis not present

## 2014-04-03 ENCOUNTER — Other Ambulatory Visit: Payer: Self-pay | Admitting: Family Medicine

## 2014-04-05 DIAGNOSIS — H2513 Age-related nuclear cataract, bilateral: Secondary | ICD-10-CM | POA: Diagnosis not present

## 2014-04-05 DIAGNOSIS — H5319 Other subjective visual disturbances: Secondary | ICD-10-CM | POA: Diagnosis not present

## 2014-06-02 ENCOUNTER — Telehealth: Payer: Self-pay | Admitting: Family Medicine

## 2014-06-02 MED ORDER — ALBUTEROL SULFATE HFA 108 (90 BASE) MCG/ACT IN AERS: INHALATION_SPRAY | RESPIRATORY_TRACT | Status: DC

## 2014-06-02 NOTE — Telephone Encounter (Signed)
Patient notified

## 2014-06-02 NOTE — Telephone Encounter (Signed)
albuterol (PROVENTIL HFA;VENTOLIN HFA) 108 (90 BSE) MCG/ACT inhaler  Pt states she feels she is wheezing a little but her inhaler has expired,  Can we call her in  A new one  [facility]

## 2014-06-15 ENCOUNTER — Other Ambulatory Visit: Payer: Self-pay | Admitting: Family Medicine

## 2014-06-17 ENCOUNTER — Other Ambulatory Visit: Payer: Self-pay | Admitting: Family Medicine

## 2014-07-01 ENCOUNTER — Other Ambulatory Visit: Payer: Self-pay | Admitting: Family Medicine

## 2014-07-02 ENCOUNTER — Other Ambulatory Visit: Payer: Self-pay | Admitting: Family Medicine

## 2014-07-20 ENCOUNTER — Ambulatory Visit: Payer: Medicare Other | Admitting: Cardiology

## 2014-07-30 ENCOUNTER — Encounter: Payer: Self-pay | Admitting: Cardiology

## 2014-07-30 ENCOUNTER — Ambulatory Visit (INDEPENDENT_AMBULATORY_CARE_PROVIDER_SITE_OTHER): Payer: Medicare Other | Admitting: Cardiology

## 2014-07-30 VITALS — BP 136/88 | HR 96 | Ht 63.0 in | Wt 191.0 lb

## 2014-07-30 DIAGNOSIS — I10 Essential (primary) hypertension: Secondary | ICD-10-CM | POA: Diagnosis not present

## 2014-07-30 MED ORDER — METOPROLOL TARTRATE 25 MG PO TABS: 25.0000 mg | ORAL_TABLET | Freq: Two times a day (BID) | ORAL | Status: DC

## 2014-07-30 NOTE — Progress Notes (Signed)
Clinical Summary [name] is a 74 y.o.female seen today for follow up of the following medical probloems  1.HTN  - reports diagnosed at age 3, she states has always been difficult to control  - bp log shows 110-130s/50-80s  2. Heart murmur - echo showed dynamic LVOT gradient due to hypercontractile LV, IVC was small - denies any lightheadness, dizziness, or SOB   Past Medical History  Diagnosis Date  . Hypertension   . Arthritis   . Asthma   . Allergic rhinitis   . IFG (impaired fasting glucose)   . Osteopenia   . Spinal stenosis      Allergies  Allergen Reactions  . Levaquin [Levofloxacin In D5w]     Sleep disturbance  . Detrol [Tolterodine]     Abdominal pain     Current Outpatient Prescriptions  Medication Sig Dispense Refill  . albuterol (VENTOLIN HFA) 108 (90 BASE) MCG/ACT inhaler INHALE TWO PUFFS BY MOUTH EVERY 6 HOURS AS NEEDED FOR WHEEZING 18 each 5  . amLODipine-benazepril (LOTREL) 10-40 MG per capsule TAKE 1 CAPSULE DAILY 90 capsule 0  . aspirin EC 81 MG tablet Take 81 mg by mouth daily.    . Calcium Carbonate-Vit D-Min (CALCIUM 1200 PO) Take 1 tablet by mouth 2 (two) times daily.     . carboxymethylcellulose (REFRESH PLUS) 0.5 % SOLN Place 1 drop into both eyes 2 (two) times daily as needed. Dry eyes    . chlorthalidone (HYGROTON) 25 MG tablet Take 25 mg by mouth daily.    . cloNIDine (CATAPRES) 0.2 MG tablet TAKE 1 TABLET TWICE A DAY 180 tablet 0  . doxazosin (CARDURA) 8 MG tablet TAKE 1 TABLET AT BEDTIME 90 tablet 0  . hyoscyamine (LEVSIN/SL) 0.125 MG SL tablet Place 1 tablet (0.125 mg total) under the tongue every 8 (eight) hours as needed. 30 tablet 3  . potassium chloride SA (K-DUR,KLOR-CON) 20 MEQ tablet TAKE 1 TABLET DAILY 90 tablet 0  . traMADol (ULTRAM) 50 MG tablet Take 1 tablet (50 mg total) by mouth every 6 (six) hours as needed for pain. 360 tablet 2  . triamcinolone cream (KENALOG) 0.1 % Apply topically 2 (two) times daily. 15 g 0     No current facility-administered medications for this visit.     Past Surgical History  Procedure Laterality Date  . Abdominal hysterectomy    . Total abdominal hysterectomy w/ bilateral salpingoophorectomy    . Cholecystectomy    . Back surgery    . Knee arthroscopy Left      Allergies  Allergen Reactions  . Levaquin [Levofloxacin In D5w]     Sleep disturbance  . Detrol [Tolterodine]     Abdominal pain      Family History  Problem Relation Age of Onset  . Diabetes Mother   . Hypertension Mother   . Diabetes Father   . Hypertension Father      Social History [name] reports that she has never smoked. She has never used smokeless tobacco. [name] has no alcohol history on file.   Review of Systems CONSTITUTIONAL: No weight loss, fever, chills, weakness or fatigue.  HEENT: Eyes: No visual loss, blurred vision, double vision or yellow sclerae.No hearing loss, sneezing, congestion, runny nose or sore throat.  SKIN: No rash or itching.  CARDIOVASCULAR: per HPI RESPIRATORY: No shortness of breath, cough or sputum.  GASTROINTESTINAL: No anorexia, nausea, vomiting or diarrhea. No abdominal pain or blood.  GENITOURINARY: No burning on urination, no  polyuria NEUROLOGICAL: No headache, dizziness, syncope, paralysis, ataxia, numbness or tingling in the extremities. No change in bowel or bladder control.  MUSCULOSKELETAL: No muscle, back pain, joint pain or stiffness.  LYMPHATICS: No enlarged nodes. No history of splenectomy.  PSYCHIATRIC: No history of depression or anxiety.  ENDOCRINOLOGIC: No reports of sweating, cold or heat intolerance. No polyuria or polydipsia.  Marland Kitchen   Physical Examination p 96 bp 136/88 Wt 191 lbs BMI 34 Gen: resting comfortably, no acute distress HEENT: no scleral icterus, pupils equal round and reactive, no palptable cervical adenopathy,  CV: RRR, 2/6 systolic murmur RUSB, no JVD Resp: Clear to auscultation bilaterally GI: abdomen  is soft, non-tender, non-distended, normal bowel sounds, no hepatosplenomegaly MSK: extremities are warm, no edema.  Skin: warm, no rash Neuro:  no focal deficits Psych: appropriate affect   Diagnostic Studies  01/2014 echo Study Conclusions  - Left ventricle: The cavity size was normal. Wall thickness was increased in a pattern of mild LVH. Systolic function was vigorous. The estimated ejection fraction was in the range of 65% to 70%. There was dynamic obstruction during Valsalva in the mid cavity caused by vigorous LV systolic function, with a peak velocity of 393 cm/sec and a peak gradient of 62 mm Hg. Doppler parameters are consistent with abnormal left ventricular relaxation (grade 1 diastolic dysfunction). - Left atrium: The atrium was mildly dilated. - Pulmonary arteries: PA peak pressure: 39 mm Hg (S). PASP is borderline elevated. - Systemic veins: The IVC is small, suggestive of low RA pressure and hypovolemia. - Technically adequate study.   Assessment and Plan   1. HTN  - history of difficult to control HTN, has been started on multiple agents by pcp  - from labs no clear cause of secondary HTN. Since controlled will not pursue further eval at this time (ie renal artery [place], sleep study) - continue current meds other than stopping cardura, will start lopressor. She will keep bp log x 1 week  2. Heart murmur  - hyperdynamic LV with dynamic gradient, will start metoprolol   F/u 6 months  Arnoldo Lenis, M.D.

## 2014-07-30 NOTE — Patient Instructions (Signed)
Your physician wants you to follow-up in: 6 months with Dr Bryna Colander will receive a reminder letter in the mail two months in advance. If you don't receive a letter, please call our office to schedule the follow-up appointment.   STOP Cardura   START Lopressor 25 mg twice a day    Please keep BP log for 1 week and call back with readings    Thank you for choosing [facility] !

## 2014-08-02 ENCOUNTER — Telehealth: Payer: Self-pay | Admitting: *Deleted

## 2014-08-02 NOTE — Telephone Encounter (Signed)
Please have her update on her blood pressures later in the week  Zandra Abts MD

## 2014-08-02 NOTE — Telephone Encounter (Signed)
Pt states that her BP was 88/54, she did not feel any different she feels great. Pt was placed on metoprolol 07/30/14 is when she started it

## 2014-08-02 NOTE — Telephone Encounter (Signed)
Will call her back.

## 2014-08-02 NOTE — Telephone Encounter (Signed)
Bp's 109/56, 114/54  Feels great, will watch Bp

## 2014-08-06 ENCOUNTER — Telehealth: Payer: Self-pay

## 2014-08-06 NOTE — Telephone Encounter (Signed)
Lm with husband

## 2014-08-09 NOTE — Telephone Encounter (Signed)
3/28 LMTCB-cc

## 2014-08-10 ENCOUNTER — Telehealth: Payer: Self-pay | Admitting: Cardiology

## 2014-08-10 MED ORDER — METOPROLOL TARTRATE 25 MG PO TABS
12.5000 mg | ORAL_TABLET | Freq: Two times a day (BID) | ORAL | Status: DC
Start: 2014-08-10 — End: 2014-11-18

## 2014-08-10 NOTE — Telephone Encounter (Signed)
Patient notified. All questions answered. Patient voiced understanding.  

## 2014-08-10 NOTE — Telephone Encounter (Signed)
3/19 @ 10pm - 109/56 ** P. 59 3/20 @ 8pm - 88/54 ** P 60 3/21 @ 9:45p m - 114/54 ** P 62 3/23 @ 10:15PM - 116/57 ** P 58 3/24 @ 11:15P - 117/48 ** P 55 3/28 @ 7:45P - 109/54 ** P 51  The patient is wondering if she should continue to monitor and record or is this enough information?

## 2014-08-10 NOTE — Addendum Note (Signed)
Addended by: Barbarann Ehlers A on: 08/10/2014 01:16 PM   Modules accepted: Orders

## 2014-08-10 NOTE — Telephone Encounter (Signed)
Please advise 

## 2014-08-10 NOTE — Telephone Encounter (Signed)
Please have her decrease her lopressor to 12.5mg  bid. Have her see me or Curt Bears in 3 weeks to f/u her blood pressures. Have her keep bp log one week prior to that appointment   Zandra Abts MD

## 2014-08-10 NOTE — Telephone Encounter (Signed)
LM with female for pt to call back

## 2014-08-25 ENCOUNTER — Other Ambulatory Visit: Payer: Self-pay | Admitting: Family Medicine

## 2014-08-27 ENCOUNTER — Other Ambulatory Visit: Payer: Self-pay | Admitting: Family Medicine

## 2014-08-30 ENCOUNTER — Ambulatory Visit (INDEPENDENT_AMBULATORY_CARE_PROVIDER_SITE_OTHER): Payer: Medicare Other | Admitting: Adult Health

## 2014-08-30 ENCOUNTER — Encounter: Payer: Self-pay | Admitting: Adult Health

## 2014-08-30 VITALS — BP 166/66 | HR 78 | Ht 63.0 in | Wt 192.0 lb

## 2014-08-30 DIAGNOSIS — I10 Essential (primary) hypertension: Secondary | ICD-10-CM

## 2014-08-30 MED ORDER — POTASSIUM CHLORIDE CRYS ER 20 MEQ PO TBCR: 20.0000 meq | EXTENDED_RELEASE_TABLET | Freq: Every day | ORAL | Status: DC

## 2014-08-30 MED ORDER — AMLODIPINE BESY-BENAZEPRIL HCL 10-40 MG PO CAPS: ORAL_CAPSULE | ORAL | Status: DC

## 2014-08-30 MED ORDER — CLONIDINE HCL 0.2 MG PO TABS: 0.2000 mg | ORAL_TABLET | Freq: Two times a day (BID) | ORAL | Status: DC

## 2014-08-30 NOTE — Progress Notes (Deleted)
Name: LADESHA PACINI    DOB: 1940/07/02  Age: 74 y.o.  MR#: [id number]       PCP:  Rubbie Battiest, MD      Insurance: Payor: MEDICARE / Plan: MEDICARE PART A AND B / Product Type: *No Product type* /   CC:    Chief Complaint  Patient presents with  . Hypertension    VS Filed Vitals:   08/30/14 1411  BP: 166/66  Pulse: 78  Height: $Remove'5\' 3"'WRQUbwG$  (1.6 m)  Weight: 192 lb (87.091 kg)    Weights Current Weight  08/30/14 192 lb (87.091 kg)  07/30/14 191 lb (86.637 kg)  01/20/14 195 lb (88.451 kg)    Blood Pressure  BP Readings from Last 3 Encounters:  08/30/14 166/66  07/30/14 136/88  01/20/14 185/70     Admit date:  (Not on file) Last encounter with RMR:  Visit date not found   Allergy Levaquin and Detrol  Current Outpatient Prescriptions  Medication Sig Dispense Refill  . albuterol (VENTOLIN HFA) 108 (90 BASE) MCG/ACT inhaler INHALE TWO PUFFS BY MOUTH EVERY 6 HOURS AS NEEDED FOR WHEEZING 18 each 5  . amLODipine-benazepril (LOTREL) 10-40 MG per capsule TAKE 1 CAPSULE DAILY (NEED OFFICE VISIT) 30 capsule 0  . aspirin EC 81 MG tablet Take 81 mg by mouth daily.    . Calcium Carbonate-Vit D-Min (CALCIUM 1200 PO) Take 1 tablet by mouth 2 (two) times daily.     . carboxymethylcellulose (REFRESH PLUS) 0.5 % SOLN Place 1 drop into both eyes 2 (two) times daily as needed. Dry eyes    . chlorthalidone (HYGROTON) 25 MG tablet Take 25 mg by mouth daily.    . cloNIDine (CATAPRES) 0.2 MG tablet TAKE 1 TABLET TWICE A DAY 180 tablet 0  . metoprolol tartrate (LOPRESSOR) 25 MG tablet Take 0.5 tablets (12.5 mg total) by mouth 2 (two) times daily. 90 tablet 3  . potassium chloride SA (K-DUR,KLOR-CON) 20 MEQ tablet TAKE 1 TABLET DAILY 90 tablet 0  . triamcinolone cream (KENALOG) 0.1 % Apply topically 2 (two) times daily. 15 g 0   No current facility-administered medications for this visit.    Discontinued Meds:    Medications Discontinued During This Encounter  Medication Reason  . hyoscyamine  (LEVSIN/SL) 0.125 MG SL tablet Error  . traMADol (ULTRAM) 50 MG tablet Error    Patient Active Problem List   Diagnosis Date Noted  . Unspecified gastritis and gastroduodenitis without mention of hemorrhage 09/28/2013  . HTN (hypertension) 08/26/2012    LABS    Component Value Date/Time   NA 138 01/20/2014 1052   NA 139 12/09/2013 1041   NA 141 04/18/2013 2130   K 3.6 01/20/2014 1052   K 4.2 12/09/2013 1041   K 3.7 04/18/2013 2130   CL 103 01/20/2014 1052   CL 105 12/09/2013 1041   CL 101 04/18/2013 2130   CO2 25 01/20/2014 1052   CO2 26 12/09/2013 1041   CO2 26 04/18/2013 2130   GLUCOSE 150* 01/20/2014 1052   GLUCOSE 128* 12/09/2013 1041   GLUCOSE 141* 04/18/2013 2130   BUN 24* 01/20/2014 1052   BUN 18 12/09/2013 1041   BUN 11 04/18/2013 2130   CREATININE 0.93 01/20/2014 1052   CREATININE 0.78 12/09/2013 1041   CREATININE 0.80 06/05/2013 1534   CREATININE 0.76 04/18/2013 2130   CREATININE 0.59 10/07/2010 0514   CALCIUM 9.9 01/20/2014 1052   CALCIUM 9.2 12/09/2013 1041   CALCIUM 9.9 04/18/2013 2130   GFRNONAA 82*  04/18/2013 2130   GFRNONAA >60 10/07/2010 0514   GFRNONAA >60 10/06/2010 0405   GFRAA >90 04/18/2013 2130   GFRAA  10/07/2010 0514    >60        The eGFR has been calculated using the MDRD equation. This calculation has not been validated in all clinical situations. eGFR's persistently <60 mL/min signify possible Chronic Kidney Disease.   GFRAA  10/06/2010 0405    >60        The eGFR has been calculated using the MDRD equation. This calculation has not been validated in all clinical situations. eGFR's persistently <60 mL/min signify possible Chronic Kidney Disease.   CMP     Component Value Date/Time   NA 138 01/20/2014 1052   K 3.6 01/20/2014 1052   CL 103 01/20/2014 1052   CO2 25 01/20/2014 1052   GLUCOSE 150* 01/20/2014 1052   BUN 24* 01/20/2014 1052   CREATININE 0.93 01/20/2014 1052   CREATININE 0.80 06/05/2013 1534   CALCIUM 9.9  01/20/2014 1052   PROT 8.1 09/27/2010 1050   ALBUMIN 4.0 09/27/2010 1050   AST 18 09/27/2010 1050   ALT 12 09/27/2010 1050   ALKPHOS 99 09/27/2010 1050   BILITOT 0.3 09/27/2010 1050   GFRNONAA 82* 04/18/2013 2130   GFRAA >90 04/18/2013 2130       Component Value Date/Time   WBC 6.2 12/09/2013 1041   WBC 10.9* 04/18/2013 2130   WBC 9.0 10/07/2010 0514   HGB 12.8 12/09/2013 1041   HGB 12.7 04/18/2013 2130   HGB 9.5* 10/07/2010 0514   HCT 37.8 12/09/2013 1041   HCT 38.9 04/18/2013 2130   HCT 28.9* 10/07/2010 0514   MCV 87.3 12/09/2013 1041   MCV 90.0 04/18/2013 2130   MCV 86.5 10/07/2010 0514    Lipid Panel     Component Value Date/Time   CHOL  03/19/2010 0630    140        ATP III CLASSIFICATION:  <200     mg/dL   Desirable  200-239  mg/dL   Borderline High  >=240    mg/dL   High          TRIG 50 03/19/2010 0630   HDL 53 03/19/2010 0630   CHOLHDL 2.6 03/19/2010 0630   VLDL 10 03/19/2010 0630   LDLCALC  03/19/2010 0630    77        Total Cholesterol/HDL:CHD Risk Coronary Heart Disease Risk Table                     Men   Women  1/2 Average Risk   3.4   3.3  Average Risk       5.0   4.4  2 X Average Risk   9.6   7.1  3 X Average Risk  23.4   11.0        Use the calculated Patient Ratio above and the CHD Risk Table to determine the patient's CHD Risk.        ATP III CLASSIFICATION (LDL):  <100     mg/dL   Optimal  100-129  mg/dL   Near or Above                    Optimal  130-159  mg/dL   Borderline  160-189  mg/dL   High  >190     mg/dL   Very High    ABG No results found for: PHART, PCO2ART, PO2ART, HCO3, TCO2, ACIDBASEDEF,  O2SAT   Lab Results  Component Value Date   TSH 0.338* 12/09/2013   BNP (last 3 results) No results for input(s): BNP in the last 8760 hours.  ProBNP (last 3 results) No results for input(s): PROBNP in the last 8760 hours.  Cardiac Panel (last 3 results) No results for input(s): CKTOTAL, CKMB, TROPONINI, RELINDX in the last  72 hours.  Iron/TIBC/Ferritin/ %Sat No results found for: IRON, TIBC, FERRITIN, IRONPCTSAT   EKG Orders placed or performed in visit on 07/30/14  . EKG 12-Lead     Prior Assessment and Plan Problem List as of 08/30/2014      Cardiovascular and Mediastinum   HTN (hypertension)     Digestive   Unspecified gastritis and gastroduodenitis without mention of hemorrhage       Imaging: No results found.

## 2014-08-30 NOTE — Patient Instructions (Signed)
Your physician recommends that you schedule a follow-up appointment in: 3 months with Dr. Harl Bowie  Your physician recommends that you return for lab work in: BMET ( Copy to PCP)  Thank you for choosing [facility]!

## 2014-08-30 NOTE — Progress Notes (Signed)
Cardiology Office Note   Date:  08/30/2014   ID:  [name], DOB 23-Dec-1940, MRN [id number]  PCP:  Rubbie Battiest, MD  Cardiologist:  Cloria Spring, NP   Chief Complaint  Patient presents with  . Hypertension      History of Present Illness: [name] is a 74 y.o. female who presents for ongoing assessment and management of hypertension, with history of arthritis, spinal stenosis, with dynamic left ventricular outflow tract gradient tissue hypercontractile LV.    She was last seen inBy Dr. Harl Bowie on 07/30/2014, continued to have difficult to control, hypertension, and has been started on multiple agents, Cardura was discontinued and the Toprol was started at 25 mg twice a day.  She was asked to keep a blood pressure log and is here on followup.since being seen last, she did have one Loreal her blood pressure and it feels tired and lightheaded.  The metoprolol was decreased to 12.5 mg twice a day.  She has diligently been taking her blood pressure daily and has been running 710G to 269S systolic.  She says she feels great.  She is very grateful to Dr. Harl Bowie for "figuring out my blood pressure problem."  She states that she has her energy back and is feeling the best she has in years.    Past Medical History  Diagnosis Date  . Hypertension   . Arthritis   . Asthma   . Allergic rhinitis   . IFG (impaired fasting glucose)   . Osteopenia   . Spinal stenosis     Past Surgical History  Procedure Laterality Date  . Abdominal hysterectomy    . Total abdominal hysterectomy w/ bilateral salpingoophorectomy    . Cholecystectomy    . Back surgery    . Knee arthroscopy Left      Current Outpatient Prescriptions  Medication Sig Dispense Refill  . albuterol (VENTOLIN HFA) 108 (90 BASE) MCG/ACT inhaler INHALE TWO PUFFS BY MOUTH EVERY 6 HOURS AS NEEDED FOR WHEEZING 18 each 5  . amLODipine-benazepril (LOTREL) 10-40 MG per capsule TAKE 1 CAPSULE DAILY (NEED OFFICE  VISIT) 30 capsule 0  . aspirin EC 81 MG tablet Take 81 mg by mouth daily.    . Calcium Carbonate-Vit D-Min (CALCIUM 1200 PO) Take 1 tablet by mouth 2 (two) times daily.     . carboxymethylcellulose (REFRESH PLUS) 0.5 % SOLN Place 1 drop into both eyes 2 (two) times daily as needed. Dry eyes    . chlorthalidone (HYGROTON) 25 MG tablet Take 25 mg by mouth daily.    . cloNIDine (CATAPRES) 0.2 MG tablet TAKE 1 TABLET TWICE A DAY 180 tablet 0  . hyoscyamine (LEVSIN/SL) 0.125 MG SL tablet Place 1 tablet (0.125 mg total) under the tongue every 8 (eight) hours as needed. 30 tablet 3  . metoprolol tartrate (LOPRESSOR) 25 MG tablet Take 0.5 tablets (12.5 mg total) by mouth 2 (two) times daily. 90 tablet 3  . potassium chloride SA (K-DUR,KLOR-CON) 20 MEQ tablet TAKE 1 TABLET DAILY 90 tablet 0  . traMADol (ULTRAM) 50 MG tablet Take 1 tablet (50 mg total) by mouth every 6 (six) hours as needed for pain. 360 tablet 2  . triamcinolone cream (KENALOG) 0.1 % Apply topically 2 (two) times daily. 15 g 0   No current facility-administered medications for this visit.    Allergies:   Levaquin and Detrol    Social History:  The patient  reports that she has never smoked. She has never used  smokeless tobacco.   Family History:  The patient's family history includes Diabetes in her father and mother; Hypertension in her father and mother.    ROS: .   All other systems are reviewed and negative.Unless otherwise mentioned in H&P above.   PHYSICAL EXAM: VS:  There were no vitals taken for this visit. , BMI There is no weight on file to calculate BMI. GEN: Well nourished, well developed, in no acute distress HEENT: normal Neck: no JVD, carotid bruits, or masses Cardiac:RRR; no murmurs, rubs, or gallops,no edema  Respiratory:  Clear to auscultation bilaterally, normal work of breathing GI: soft, nontender, nondistended, + BS MS: no deformity or atrophy Skin: warm and dry, no rash Neuro:  Strength and sensation  are intact Psych: euthymic mood, full affect   Recent Labs: 12/09/2013: Hemoglobin 12.8; Platelets 287; TSH 0.338* 01/20/2014: BUN 24*; Creatinine 0.93; Potassium 3.6; Sodium 138    Lipid Panel    Component Value Date/Time   CHOL  03/19/2010 0630    140        ATP III CLASSIFICATION:  <200     mg/dL   Desirable  200-239  mg/dL   Borderline High  >=240    mg/dL   High          TRIG 50 03/19/2010 0630   HDL 53 03/19/2010 0630   CHOLHDL 2.6 03/19/2010 0630   VLDL 10 03/19/2010 0630   LDLCALC  03/19/2010 0630    77        Total Cholesterol/HDL:CHD Risk Coronary Heart Disease Risk Table                     Men   Women  1/2 Average Risk   3.4   3.3  Average Risk       5.0   4.4  2 X Average Risk   9.6   7.1  3 X Average Risk  23.4   11.0        Use the calculated Patient Ratio above and the CHD Risk Table to determine the patient's CHD Risk.        ATP III CLASSIFICATION (LDL):  <100     mg/dL   Optimal  100-129  mg/dL   Near or Above                    Optimal  130-159  mg/dL   Borderline  160-189  mg/dL   High  >190     mg/dL   Very High      Wt Readings from Last 3 Encounters:  07/30/14 191 lb (86.637 kg)  01/20/14 195 lb (88.451 kg)  12/31/13 193 lb 6 oz (87.714 kg)      Other studies Reviewed: Additional studies/ records that were reviewed today include: blood pressure recording record.  Over last month Review of the above records demonstrates: much better.  Blood pressure control.  ASSESSMENT AND PLAN:  1.  Hypertension: Blood pressure is elevated here in the office, but I believe this is related to whitecoat syndrome.  Her blood pressure readings, which she brings with her on recording sheet have been 120/68 to 130/72 consistently.  She states she feels the best she has in years and is tolerating the Toprol 12.5 mg twice a day along with amlodipine/benazepril 10/40 mg daily and clonidine 0.2 mg twice a day.  I have given her refills on clonidine, potassium,  and Lotrel.  I am following up with the BMET  for kidney function.  She is due to see her primary care physician, Dr. Wolfgang Phoenix .  Labs will be forwarded to him.  We will see her again in 3 months unless symptomatic.  She should continue to take her blood pressure daily and record.  New recording sheets are provided for her   Current medicines are reviewed at length with the patient today.    Labs/ tests ordered today include: BMET  No orders of the defined types were placed in this encounter.     Disposition:   FU with 3 months  Signed, Jory Sims, NP  08/30/2014 7:25 AM    [facility] [address] [place], [place] [address] Phone: [phone]; Fax: [phone]

## 2014-08-31 LAB — BASIC METABOLIC PANEL
BUN: 37 mg/dL — AB (ref 6–23)
CALCIUM: 10.3 mg/dL (ref 8.4–10.5)
CO2: 25 meq/L (ref 19–32)
CREATININE: 1 mg/dL (ref 0.50–1.10)
Chloride: 103 mEq/L (ref 96–112)
GLUCOSE: 100 mg/dL — AB (ref 70–99)
Potassium: 4 mEq/L (ref 3.5–5.3)
SODIUM: 137 meq/L (ref 135–145)

## 2014-11-01 DIAGNOSIS — G5602 Carpal tunnel syndrome, left upper limb: Secondary | ICD-10-CM | POA: Diagnosis not present

## 2014-11-01 DIAGNOSIS — M542 Cervicalgia: Secondary | ICD-10-CM | POA: Diagnosis not present

## 2014-11-01 DIAGNOSIS — G5601 Carpal tunnel syndrome, right upper limb: Secondary | ICD-10-CM | POA: Diagnosis not present

## 2014-11-01 DIAGNOSIS — M4722 Other spondylosis with radiculopathy, cervical region: Secondary | ICD-10-CM | POA: Diagnosis not present

## 2014-11-02 ENCOUNTER — Encounter: Payer: Self-pay | Admitting: Family Medicine

## 2014-11-02 ENCOUNTER — Ambulatory Visit (INDEPENDENT_AMBULATORY_CARE_PROVIDER_SITE_OTHER): Payer: Medicare Other | Admitting: Family Medicine

## 2014-11-02 VITALS — BP 160/80 | Temp 98.3°F | Ht 63.0 in | Wt 193.5 lb

## 2014-11-02 DIAGNOSIS — H9311 Tinnitus, right ear: Secondary | ICD-10-CM

## 2014-11-02 NOTE — Progress Notes (Signed)
   Subjective:    Patient ID: [name], female    DOB: Jan 21, 1941, 74 y.o.   MRN: [id number]  Otalgia  This is a new problem. The current episode started yesterday. The problem occurs constantly. The problem has been unchanged. There has been no fever. The pain is at a severity of 0/10. The patient is experiencing no pain (Ringing). She has tried nothing for the symptoms. The treatment provided no relief.   Patient states that she has no other concerns at this time.  Ringing severe in the right ear. Patient actually is not having pain. More just very aggravating ringing  Pt states no sig pain  Dr teogh ref  Day # [id number]  Pt already has appt 20th of June and 1st on july  Review of Systems  HENT: Positive for ear pain.    no fever no cough congestion     Objective:   Physical Exam  Alert vitals stable HEENT tympanic membranes normal external canal normal frontal neck supple lungs clear heart regular in rhythm.      Assessment & Plan:  Impression history of tinnitus substantially worse now discussed plan ENT referral [facility]

## 2014-11-08 DIAGNOSIS — H1851 Endothelial corneal dystrophy: Secondary | ICD-10-CM | POA: Diagnosis not present

## 2014-11-08 DIAGNOSIS — D3132 Benign neoplasm of left choroid: Secondary | ICD-10-CM | POA: Diagnosis not present

## 2014-11-08 DIAGNOSIS — H2513 Age-related nuclear cataract, bilateral: Secondary | ICD-10-CM | POA: Diagnosis not present

## 2014-11-18 ENCOUNTER — Other Ambulatory Visit: Payer: Self-pay | Admitting: Cardiology

## 2014-11-18 MED ORDER — METOPROLOL TARTRATE 25 MG PO TABS: 12.5000 mg | ORAL_TABLET | Freq: Two times a day (BID) | ORAL | Status: DC

## 2014-11-18 NOTE — Telephone Encounter (Signed)
The patient called in saying she did not get her metoprolol 25 mg refilled at [facility].  Could you please call in a refill for Metoprolol 25 mg to [facility]. [facility]

## 2014-11-18 NOTE — Telephone Encounter (Signed)
Refill complete 

## 2014-11-18 NOTE — Telephone Encounter (Signed)
Alma Friendly can you please take care of this   Zandra Abts MD

## 2014-11-19 ENCOUNTER — Telehealth: Payer: Self-pay | Admitting: Cardiology

## 2014-11-19 MED ORDER — METOPROLOL TARTRATE 25 MG PO TABS: 12.5000 mg | ORAL_TABLET | Freq: Two times a day (BID) | ORAL | Status: DC

## 2014-11-19 NOTE — Telephone Encounter (Signed)
Patient said that her Rx refills were called in to [facility] and it should have been [facility].  Please call patient if you have any questions.

## 2014-11-22 ENCOUNTER — Other Ambulatory Visit: Payer: Self-pay | Admitting: Family Medicine

## 2014-11-22 DIAGNOSIS — H9311 Tinnitus, right ear: Secondary | ICD-10-CM | POA: Diagnosis not present

## 2014-11-22 DIAGNOSIS — H903 Sensorineural hearing loss, bilateral: Secondary | ICD-10-CM | POA: Diagnosis not present

## 2014-11-22 NOTE — Telephone Encounter (Signed)
Needs office visit.

## 2014-11-30 DIAGNOSIS — M542 Cervicalgia: Secondary | ICD-10-CM | POA: Diagnosis not present

## 2014-11-30 DIAGNOSIS — G5602 Carpal tunnel syndrome, left upper limb: Secondary | ICD-10-CM | POA: Diagnosis not present

## 2014-12-20 DIAGNOSIS — G5602 Carpal tunnel syndrome, left upper limb: Secondary | ICD-10-CM | POA: Diagnosis not present

## 2014-12-21 ENCOUNTER — Other Ambulatory Visit: Payer: Self-pay | Admitting: Family Medicine

## 2014-12-21 DIAGNOSIS — Z1231 Encounter for screening mammogram for malignant neoplasm of breast: Secondary | ICD-10-CM

## 2014-12-27 DIAGNOSIS — M545 Low back pain: Secondary | ICD-10-CM | POA: Diagnosis not present

## 2014-12-27 DIAGNOSIS — G5602 Carpal tunnel syndrome, left upper limb: Secondary | ICD-10-CM | POA: Diagnosis not present

## 2015-01-10 ENCOUNTER — Ambulatory Visit (HOSPITAL_COMMUNITY)
Admission: RE | Admit: 2015-01-10 | Discharge: 2015-01-10 | Disposition: A | Discharge status: Home / Self Care | Payer: Medicare Other | Source: Ambulatory Visit | Attending: Family Medicine | Admitting: Family Medicine

## 2015-01-10 DIAGNOSIS — Z1231 Encounter for screening mammogram for malignant neoplasm of breast: Secondary | ICD-10-CM | POA: Insufficient documentation

## 2015-01-31 DIAGNOSIS — G5602 Carpal tunnel syndrome, left upper limb: Secondary | ICD-10-CM | POA: Diagnosis not present

## 2015-01-31 DIAGNOSIS — G5601 Carpal tunnel syndrome, right upper limb: Secondary | ICD-10-CM | POA: Diagnosis not present

## 2015-01-31 DIAGNOSIS — M1812 Unilateral primary osteoarthritis of first carpometacarpal joint, left hand: Secondary | ICD-10-CM | POA: Diagnosis not present

## 2015-02-02 ENCOUNTER — Ambulatory Visit (INDEPENDENT_AMBULATORY_CARE_PROVIDER_SITE_OTHER): Payer: Medicare Other | Admitting: Cardiology

## 2015-02-02 ENCOUNTER — Encounter: Payer: Self-pay | Admitting: Cardiology

## 2015-02-02 VITALS — BP 128/72 | HR 69 | Ht 63.0 in | Wt 196.0 lb

## 2015-02-02 DIAGNOSIS — I10 Essential (primary) hypertension: Secondary | ICD-10-CM

## 2015-02-02 DIAGNOSIS — R739 Hyperglycemia, unspecified: Secondary | ICD-10-CM | POA: Diagnosis not present

## 2015-02-02 DIAGNOSIS — R7309 Other abnormal glucose: Secondary | ICD-10-CM

## 2015-02-02 NOTE — Patient Instructions (Signed)
Your physician wants you to follow-up in: 1 year with Dr Bryna Colander will receive a reminder letter in the mail two months in advance. If you don't receive a letter, please call our office to schedule the follow-up appointment.     Your physician recommends that you continue on your current medications as directed. Please refer to the Current Medication list given to you today.    Your physician recommends that you return for lab work in: [place]    Thank you for choosing [facility] !

## 2015-02-02 NOTE — Progress Notes (Signed)
Patient ID: [name], female   DOB: 02-Apr-1941, 74 y.o.   MRN: [id number]     Clinical Summary [name] is a 74 y.o.female seen today for follow up of the following medical problems.  1.HTN  - reports diagnosed at age 16, she states has always been difficult to control  - bp log 07/2014 with borderline low bp's and heart rates, lopressor decreased to 12.5mg  bid - history of white coat HTN, often clinic bp's elevated while home numbers at goal  - home log shows bp 108-117/50-60s - compliant with meds   Past Medical History  Diagnosis Date  . Hypertension   . Arthritis   . Asthma   . Allergic rhinitis   . IFG (impaired fasting glucose)   . Osteopenia   . Spinal stenosis      Allergies  Allergen Reactions  . Levaquin [Levofloxacin In D5w]     Sleep disturbance  . Detrol [Tolterodine]     Abdominal pain     Current Outpatient Prescriptions  Medication Sig Dispense Refill  . albuterol (VENTOLIN HFA) 108 (90 BASE) MCG/ACT inhaler INHALE TWO PUFFS BY MOUTH EVERY 6 HOURS AS NEEDED FOR WHEEZING 18 each 5  . amLODipine-benazepril (LOTREL) 10-40 MG per capsule TAKE 1 CAPSULE DAILY 90 capsule 3  . aspirin EC 81 MG tablet Take 81 mg by mouth daily.    . Calcium Carbonate-Vit D-Min (CALCIUM 1200 PO) Take 1 tablet by mouth 2 (two) times daily.     . carboxymethylcellulose (REFRESH PLUS) 0.5 % SOLN Place 1 drop into both eyes 2 (two) times daily as needed. Dry eyes    . chlorthalidone (HYGROTON) 25 MG tablet Take 25 mg by mouth daily.    . cloNIDine (CATAPRES) 0.2 MG tablet TAKE 1 TABLET TWICE A DAY (NEEDS OFFICE VISIT) 60 tablet 0  . gabapentin (NEURONTIN) 100 MG capsule 100 mg. 1-3 tablets once daily at bedtime    . metoprolol tartrate (LOPRESSOR) 25 MG tablet Take 0.5 tablets (12.5 mg total) by mouth 2 (two) times daily. 90 tablet 3  . potassium chloride SA (K-DUR,KLOR-CON) 20 MEQ tablet Take 1 tablet (20 mEq total) by mouth daily. 90 tablet 3  . triamcinolone cream  (KENALOG) 0.1 % Apply topically 2 (two) times daily. 15 g 0   No current facility-administered medications for this visit.     Past Surgical History  Procedure Laterality Date  . Abdominal hysterectomy    . Total abdominal hysterectomy w/ bilateral salpingoophorectomy    . Cholecystectomy    . Back surgery    . Knee arthroscopy Left      Allergies  Allergen Reactions  . Levaquin [Levofloxacin In D5w]     Sleep disturbance  . Detrol [Tolterodine]     Abdominal pain      Family History  Problem Relation Age of Onset  . Diabetes Mother   . Hypertension Mother   . Diabetes Father   . Hypertension Father      Social History [name] reports that she has never smoked. She has never used smokeless tobacco. [name] has no alcohol history on file.   Review of Systems CONSTITUTIONAL: No weight loss, fever, chills, weakness or fatigue.  HEENT: Eyes: No visual loss, blurred vision, double vision or yellow sclerae.No hearing loss, sneezing, congestion, runny nose or sore throat.  SKIN: No rash or itching.  CARDIOVASCULAR: no chest pain, no palpitaitons RESPIRATORY: No shortness of breath, cough or sputum.  GASTROINTESTINAL: No anorexia, nausea, vomiting or  diarrhea. No abdominal pain or blood.  GENITOURINARY: No burning on urination, no polyuria NEUROLOGICAL: No headache, dizziness, syncope, paralysis, ataxia, numbness or tingling in the extremities. No change in bowel or bladder control.  MUSCULOSKELETAL: No muscle, back pain, joint pain or stiffness.  LYMPHATICS: No enlarged nodes. No history of splenectomy.  PSYCHIATRIC: No history of depression or anxiety.  ENDOCRINOLOGIC: No reports of sweating, cold or heat intolerance. No polyuria or polydipsia.  Marland Kitchen   Physical Examination Filed Vitals:   02/02/15 0858  BP: 128/72  Pulse: 69   Filed Vitals:   02/02/15 0858  Height: 5\' 3"  (1.6 m)  Weight: 196 lb (88.905 kg)    Gen: resting comfortably, no acute  distress HEENT: no scleral icterus, pupils equal round and reactive, no palptable cervical adenopathy,  CV: RRR, 2/6 systolic murmur rusb, no jvd Resp: Clear to auscultation bilaterally GI: abdomen is soft, non-tender, non-distended, normal bowel sounds, no hepatosplenomegaly MSK: extremities are warm, no edema.  Skin: warm, no rash Neuro:  no focal deficits Psych: appropriate affect   Diagnostic Studies  01/2014 echo Study Conclusions  - Left ventricle: The cavity size was normal. Wall thickness was increased in a pattern of mild LVH. Systolic function was vigorous. The estimated ejection fraction was in the range of 65% to 70%. There was dynamic obstruction during Valsalva in the mid cavity caused by vigorous LV systolic function, with a peak velocity of 393 cm/sec and a peak gradient of 62 mm Hg. Doppler parameters are consistent with abnormal left ventricular relaxation (grade 1 diastolic dysfunction). - Left atrium: The atrium was mildly dilated. - Pulmonary arteries: PA peak pressure: 39 mm Hg (S). PASP is borderline elevated. - Systemic veins: The IVC is small, suggestive of low RA pressure and hypovolemia. - Technically adequate study.   Assessment and Plan  1. HTN  - history of difficult to control HTN, has been started on multiple agents by pcp  - bp currently controlled, continue current meds   F/u 1 year         Arnoldo Lenis, M.D.

## 2015-02-04 DIAGNOSIS — R7309 Other abnormal glucose: Secondary | ICD-10-CM | POA: Diagnosis not present

## 2015-02-04 DIAGNOSIS — R739 Hyperglycemia, unspecified: Secondary | ICD-10-CM | POA: Diagnosis not present

## 2015-02-04 DIAGNOSIS — I10 Essential (primary) hypertension: Secondary | ICD-10-CM | POA: Diagnosis not present

## 2015-02-04 LAB — LIPID PANEL
Cholesterol: 150 mg/dL (ref 125–200)
HDL: 44 mg/dL — AB (ref >=46)
LDL Cholesterol: 87 mg/dL (ref <130)
TRIGLYCERIDES: 96 mg/dL (ref <150)
Total CHOL/HDL Ratio: 3.4 Ratio (ref <=5.0)
VLDL: 19 mg/dL (ref <30)

## 2015-02-04 LAB — CBC
HEMATOCRIT: 39.2 % (ref 36.0–46.0)
HEMOGLOBIN: 13 g/dL (ref 12.0–15.0)
MCH: 29.2 pg (ref 26.0–34.0)
MCHC: 33.2 g/dL (ref 30.0–36.0)
MCV: 88.1 fL (ref 78.0–100.0)
MPV: 9.9 fL (ref 8.6–12.4)
Platelets: 301 10*3/uL (ref 150–400)
RBC: 4.45 MIL/uL (ref 3.87–5.11)
RDW: 13.2 % (ref 11.5–15.5)
WBC: 6.9 10*3/uL (ref 4.0–10.5)

## 2015-02-04 LAB — BASIC METABOLIC PANEL
BUN: 28 mg/dL — ABNORMAL HIGH (ref 7–25)
CHLORIDE: 106 mmol/L (ref 98–110)
CO2: 24 mmol/L (ref 20–31)
Calcium: 9.6 mg/dL (ref 8.6–10.4)
Creat: 1.05 mg/dL — ABNORMAL HIGH (ref 0.60–0.93)
Glucose, Bld: 104 mg/dL — ABNORMAL HIGH (ref 65–99)
POTASSIUM: 4.5 mmol/L (ref 3.5–5.3)
Sodium: 138 mmol/L (ref 135–146)

## 2015-02-04 LAB — MAGNESIUM: MAGNESIUM: 1.8 mg/dL (ref 1.5–2.5)

## 2015-02-04 LAB — HEMOGLOBIN A1C
Hgb A1c MFr Bld: 6.4 % — ABNORMAL HIGH (ref <5.7)
Mean Plasma Glucose: 137 mg/dL — ABNORMAL HIGH (ref <117)

## 2015-02-04 LAB — TSH: TSH: 0.634 u[IU]/mL (ref 0.350–4.500)

## 2015-02-07 ENCOUNTER — Other Ambulatory Visit: Payer: Self-pay | Admitting: Cardiology

## 2015-02-21 ENCOUNTER — Encounter: Payer: Self-pay | Admitting: Family Medicine

## 2015-02-21 ENCOUNTER — Ambulatory Visit (INDEPENDENT_AMBULATORY_CARE_PROVIDER_SITE_OTHER): Payer: Medicare Other | Admitting: Family Medicine

## 2015-02-21 VITALS — BP 160/78 | Ht 63.0 in | Wt 197.2 lb

## 2015-02-21 DIAGNOSIS — Z23 Encounter for immunization: Secondary | ICD-10-CM | POA: Diagnosis not present

## 2015-02-21 DIAGNOSIS — I10 Essential (primary) hypertension: Secondary | ICD-10-CM | POA: Diagnosis not present

## 2015-02-21 DIAGNOSIS — R7301 Impaired fasting glucose: Secondary | ICD-10-CM | POA: Diagnosis not present

## 2015-02-21 DIAGNOSIS — R21 Rash and other nonspecific skin eruption: Secondary | ICD-10-CM

## 2015-02-21 MED ORDER — PNEUMOCOCCAL 13-VAL CONJ VACC IM SUSP
0.5000 mL | Freq: Once | INTRAMUSCULAR | Status: AC
  Administered 2015-02-21: 0.5 mL via INTRAMUSCULAR

## 2015-02-21 NOTE — Progress Notes (Signed)
   Subjective:    Patient ID: [name], female    DOB: 1940/10/29, 74 y.o.   MRN: [id number]  Hypertension This is a chronic problem. There are no compliance problems.    compliant with blood pressure medicine. Does not miss dose. Has cut down salt intake.    Pt has this summer ate a lot of sugary foods and now is paying for it, trying to wach . Patient very concerned about her A1c. Now at 6.4%. Follow-up positive family history diabetes. Patient admits to dietary noncompliance. Also not exercising.    Pt has gained some, not lost   Patient in today to discuss lab results from 02/02/15.   Patient would also like to discuss mole to left upper arm. Small brown spot. Seems somewhat larger. Now rough in texture. Also pruritic  Review of Systems No headache no chest pain no back pain no abdominal pain no change in bowel habits ROS otherwise negative    Objective:   Physical Exam  Alert vitals stable blood pressure good on repeat HEENT normal lungs clear. Heart regular in rhythm. Left upper arm seborrheic keratosis with small excoriation      Assessment & Plan:  Impression 1 seborrheic keratosis reassured #2 hypertension good control discussed managed well on current meds #3 impaired fasting glucose. Not 2 diabetes yet but awfully close. Long discussion held on diet and exercise and weight loss plan is noted above. Medications refilled. Diet exercise discussed flu shot today. Recheck in 6 months WSL

## 2015-03-25 DIAGNOSIS — G5601 Carpal tunnel syndrome, right upper limb: Secondary | ICD-10-CM | POA: Diagnosis not present

## 2015-03-25 DIAGNOSIS — G5602 Carpal tunnel syndrome, left upper limb: Secondary | ICD-10-CM | POA: Diagnosis not present

## 2015-04-11 DIAGNOSIS — G5602 Carpal tunnel syndrome, left upper limb: Secondary | ICD-10-CM | POA: Diagnosis not present

## 2015-04-25 DIAGNOSIS — Z4789 Encounter for other orthopedic aftercare: Secondary | ICD-10-CM | POA: Diagnosis not present

## 2015-04-25 DIAGNOSIS — G5602 Carpal tunnel syndrome, left upper limb: Secondary | ICD-10-CM | POA: Diagnosis not present

## 2015-04-25 DIAGNOSIS — G5601 Carpal tunnel syndrome, right upper limb: Secondary | ICD-10-CM | POA: Diagnosis not present

## 2015-05-17 ENCOUNTER — Telehealth: Payer: Self-pay | Admitting: Cardiology

## 2015-05-17 MED ORDER — CLONIDINE HCL 0.2 MG PO TABS: ORAL_TABLET | ORAL | Status: DC

## 2015-05-17 NOTE — Addendum Note (Signed)
Addended by: Debbora Lacrosse R on: 05/17/2015 03:25 PM   Modules accepted: Orders

## 2015-05-17 NOTE — Telephone Encounter (Signed)
Pt is on her last 30 day supply of Clonidine pt uses [facility]

## 2015-06-02 DIAGNOSIS — G5602 Carpal tunnel syndrome, left upper limb: Secondary | ICD-10-CM | POA: Diagnosis not present

## 2015-06-02 DIAGNOSIS — Z4789 Encounter for other orthopedic aftercare: Secondary | ICD-10-CM | POA: Diagnosis not present

## 2015-07-05 DIAGNOSIS — Z4789 Encounter for other orthopedic aftercare: Secondary | ICD-10-CM | POA: Diagnosis not present

## 2015-07-05 DIAGNOSIS — G5602 Carpal tunnel syndrome, left upper limb: Secondary | ICD-10-CM | POA: Diagnosis not present

## 2015-07-11 DIAGNOSIS — G5602 Carpal tunnel syndrome, left upper limb: Secondary | ICD-10-CM | POA: Diagnosis not present

## 2015-07-11 DIAGNOSIS — M62542 Muscle wasting and atrophy, not elsewhere classified, left hand: Secondary | ICD-10-CM | POA: Diagnosis not present

## 2015-07-11 DIAGNOSIS — M25632 Stiffness of left wrist, not elsewhere classified: Secondary | ICD-10-CM | POA: Diagnosis not present

## 2015-07-11 DIAGNOSIS — M25642 Stiffness of left hand, not elsewhere classified: Secondary | ICD-10-CM | POA: Diagnosis not present

## 2015-07-14 ENCOUNTER — Ambulatory Visit (INDEPENDENT_AMBULATORY_CARE_PROVIDER_SITE_OTHER): Payer: Medicare Other | Admitting: Cardiology

## 2015-07-14 ENCOUNTER — Encounter: Payer: Self-pay | Admitting: Cardiology

## 2015-07-14 VITALS — BP 170/72 | HR 83 | Ht 63.0 in | Wt 177.0 lb

## 2015-07-14 DIAGNOSIS — I10 Essential (primary) hypertension: Secondary | ICD-10-CM

## 2015-07-14 NOTE — Patient Instructions (Signed)
Medication Instructions:  Your physician recommends that you continue on your current medications as directed. Please refer to the Current Medication list given to you today.   Labwork: none  Testing/Procedures: none  Follow-Up: Your physician wants you to follow-up in: 1 year with Dr. Harl [facility].  You will receive a reminder letter in the mail two months in advance. If you don't receive a letter, please call our office to schedule the follow-up appointment.   Any Other Special Instructions Will Be Listed Below (If Applicable).  Please keep a blood pressure log for 1 week and either call us with readings or drop it off by the office for the Dr. To review.    If you need a refill on your cardiac medications before your next appointment, please call your pharmacy.

## 2015-07-14 NOTE — Progress Notes (Signed)
Patient ID: [name], female   DOB: 1940-07-20, 75 y.o.   MRN: [id number]     Clinical Summary Ms. Semo is a 75 y.o.female seen today for follow up of the following medical problems.  1.HTN  - reports diagnosed at age 75, she states has always been difficult to control  - bp log 07/2014 with borderline low bp's and heart rates, lopressor decreased to 12.5mg  bid - history of white coat HTN, often clinic bp's elevated while home numbers at goal  - home log shows bp 110s/50-60s - compliant with meds Past Medical History  Diagnosis Date  . Hypertension   . Arthritis   . Asthma   . Allergic rhinitis   . IFG (impaired fasting glucose)   . Osteopenia   . Spinal stenosis      Allergies  Allergen Reactions  . Levaquin [Levofloxacin In D5w]     Sleep disturbance  . Detrol [Tolterodine]     Abdominal pain     Current Outpatient Prescriptions  Medication Sig Dispense Refill  . albuterol (VENTOLIN HFA) 108 (90 BASE) MCG/ACT inhaler INHALE TWO PUFFS BY MOUTH EVERY 6 HOURS AS NEEDED FOR WHEEZING 18 each 5  . amLODipine-benazepril (LOTREL) 10-40 MG per capsule TAKE 1 CAPSULE DAILY 90 capsule 3  . aspirin EC 81 MG tablet Take 81 mg by mouth daily.    . Calcium Carbonate-Vit D-Min (CALCIUM 1200 PO) Take 1 tablet by mouth 2 (two) times daily.     . carboxymethylcellulose (REFRESH PLUS) 0.5 % SOLN Place 1 drop into both eyes 2 (two) times daily as needed. Dry eyes    . chlorthalidone (HYGROTON) 25 MG tablet Take 25 mg by mouth daily.    . chlorthalidone (HYGROTON) 50 MG tablet TAKE 1 TABLET DAILY 90 tablet 2  . cloNIDine (CATAPRES) 0.2 MG tablet Take 1 tablet two times daily 180 tablet 0  . gabapentin (NEURONTIN) 100 MG capsule 100 mg. 1-3 tablets once daily at bedtime    . metoprolol tartrate (LOPRESSOR) 25 MG tablet Take 0.5 tablets (12.5 mg total) by mouth 2 (two) times daily. 90 tablet 3  . potassium chloride SA (K-DUR,KLOR-CON) 20 MEQ tablet Take 1 tablet (20 mEq total)  by mouth daily. 90 tablet 3  . triamcinolone cream (KENALOG) 0.1 % Apply topically 2 (two) times daily. 15 g 0   No current facility-administered medications for this visit.     Past Surgical History  Procedure Laterality Date  . Abdominal hysterectomy    . Total abdominal hysterectomy w/ bilateral salpingoophorectomy    . Cholecystectomy    . Back surgery    . Knee arthroscopy Left      Allergies  Allergen Reactions  . Levaquin [Levofloxacin In D5w]     Sleep disturbance  . Detrol [Tolterodine]     Abdominal pain      Family History  Problem Relation Age of Onset  . Diabetes Mother   . Hypertension Mother   . Diabetes Father   . Hypertension Father      Social History Ms. Terris reports that she has never smoked. She has never used smokeless tobacco. Ms. Panda has no alcohol history on file.   Review of Systems CONSTITUTIONAL: No weight loss, fever, chills, weakness or fatigue.  HEENT: Eyes: No visual loss, blurred vision, double vision or yellow sclerae.No hearing loss, sneezing, congestion, runny nose or sore throat.  SKIN: No rash or itching.  CARDIOVASCULAR: no chest pain no palpitations.  RESPIRATORY: No shortness of breath,  cough or sputum.  GASTROINTESTINAL: No anorexia, nausea, vomiting or diarrhea. No abdominal pain or blood.  GENITOURINARY: No burning on urination, no polyuria NEUROLOGICAL: No headache, dizziness, syncope, paralysis, ataxia, numbness or tingling in the extremities. No change in bowel or bladder control.  MUSCULOSKELETAL: No muscle, back pain, joint pain or stiffness.  LYMPHATICS: No enlarged nodes. No history of splenectomy.  PSYCHIATRIC: No history of depression or anxiety.  ENDOCRINOLOGIC: No reports of sweating, cold or heat intolerance. No polyuria or polydipsia.  Marland Kitchen   Physical Examination Filed Vitals:   07/14/15 1057  BP: 170/72  Pulse: 83   Filed Vitals:   07/14/15 1057  Height: 5\' 3"  (1.6 m)  Weight: 177 lb  (80.287 kg)    Gen: resting comfortably, no acute distress HEENT: no scleral icterus, pupils equal round and reactive, no palptable cervical adenopathy,  CV: RRR, 3/6 systolic murmur RUSB, no jvd Resp: Clear to auscultation bilaterally GI: abdomen is soft, non-tender, non-distended, normal bowel sounds, no hepatosplenomegaly MSK: extremities are warm, no edema.  Skin: warm, no rash Neuro:  no focal deficits Psych: appropriate affect   Diagnostic Studies 01/2014 echo Study Conclusions  - Left ventricle: The cavity size was normal. Wall thickness was increased in a pattern of mild LVH. Systolic function was vigorous. The estimated ejection fraction was in the range of 65% to 70%. There was dynamic obstruction during Valsalva in the mid cavity caused by vigorous LV systolic function, with a peak velocity of 393 cm/sec and a peak gradient of 62 mm Hg. Doppler parameters are consistent with abnormal left ventricular relaxation (grade 1 diastolic dysfunction). - Left atrium: The atrium was mildly dilated. - Pulmonary arteries: PA peak pressure: 39 mm Hg (S). PASP is borderline elevated. - Systemic veins: The IVC is small, suggestive of low RA pressure and hypovolemia. - Technically adequate study.    Assessment and Plan  1. HTN  - history of difficult to control HTN. Has component of white coat HTN, often clinic numbers elevated and home numbers at goal - she will keep bp log x 2 weeks and submit. Continue current meds   F/u 1 year      Arnoldo Lenis, M.D., F.A.C.C.

## 2015-07-19 DIAGNOSIS — M25642 Stiffness of left hand, not elsewhere classified: Secondary | ICD-10-CM | POA: Diagnosis not present

## 2015-07-19 DIAGNOSIS — M62542 Muscle wasting and atrophy, not elsewhere classified, left hand: Secondary | ICD-10-CM | POA: Diagnosis not present

## 2015-07-19 DIAGNOSIS — G5602 Carpal tunnel syndrome, left upper limb: Secondary | ICD-10-CM | POA: Diagnosis not present

## 2015-07-19 DIAGNOSIS — M25632 Stiffness of left wrist, not elsewhere classified: Secondary | ICD-10-CM | POA: Diagnosis not present

## 2015-07-20 DIAGNOSIS — G5602 Carpal tunnel syndrome, left upper limb: Secondary | ICD-10-CM | POA: Diagnosis not present

## 2015-07-20 DIAGNOSIS — M25632 Stiffness of left wrist, not elsewhere classified: Secondary | ICD-10-CM | POA: Diagnosis not present

## 2015-07-20 DIAGNOSIS — M25642 Stiffness of left hand, not elsewhere classified: Secondary | ICD-10-CM | POA: Diagnosis not present

## 2015-07-20 DIAGNOSIS — M62542 Muscle wasting and atrophy, not elsewhere classified, left hand: Secondary | ICD-10-CM | POA: Diagnosis not present

## 2015-07-25 ENCOUNTER — Encounter: Payer: Self-pay | Admitting: *Deleted

## 2015-07-26 DIAGNOSIS — G5602 Carpal tunnel syndrome, left upper limb: Secondary | ICD-10-CM | POA: Diagnosis not present

## 2015-07-26 DIAGNOSIS — M25632 Stiffness of left wrist, not elsewhere classified: Secondary | ICD-10-CM | POA: Diagnosis not present

## 2015-07-26 DIAGNOSIS — M62542 Muscle wasting and atrophy, not elsewhere classified, left hand: Secondary | ICD-10-CM | POA: Diagnosis not present

## 2015-07-26 DIAGNOSIS — M25642 Stiffness of left hand, not elsewhere classified: Secondary | ICD-10-CM | POA: Diagnosis not present

## 2015-07-28 DIAGNOSIS — G5602 Carpal tunnel syndrome, left upper limb: Secondary | ICD-10-CM | POA: Diagnosis not present

## 2015-07-28 DIAGNOSIS — M25632 Stiffness of left wrist, not elsewhere classified: Secondary | ICD-10-CM | POA: Diagnosis not present

## 2015-07-28 DIAGNOSIS — M25642 Stiffness of left hand, not elsewhere classified: Secondary | ICD-10-CM | POA: Diagnosis not present

## 2015-07-28 DIAGNOSIS — M62542 Muscle wasting and atrophy, not elsewhere classified, left hand: Secondary | ICD-10-CM | POA: Diagnosis not present

## 2015-08-02 ENCOUNTER — Telehealth: Payer: Self-pay

## 2015-08-02 DIAGNOSIS — M62542 Muscle wasting and atrophy, not elsewhere classified, left hand: Secondary | ICD-10-CM | POA: Diagnosis not present

## 2015-08-02 DIAGNOSIS — G5602 Carpal tunnel syndrome, left upper limb: Secondary | ICD-10-CM | POA: Diagnosis not present

## 2015-08-02 DIAGNOSIS — M25642 Stiffness of left hand, not elsewhere classified: Secondary | ICD-10-CM | POA: Diagnosis not present

## 2015-08-02 DIAGNOSIS — M25632 Stiffness of left wrist, not elsewhere classified: Secondary | ICD-10-CM | POA: Diagnosis not present

## 2015-08-02 NOTE — Telephone Encounter (Signed)
Patient notifed

## 2015-08-02 NOTE — Telephone Encounter (Signed)
-----   Message from Arnoldo Lenis, MD sent at 07/29/2015 10:36 AM EDT ----- BP log looks good, no changes  Zandra Abts MD

## 2015-08-04 DIAGNOSIS — M25642 Stiffness of left hand, not elsewhere classified: Secondary | ICD-10-CM | POA: Diagnosis not present

## 2015-08-04 DIAGNOSIS — G5602 Carpal tunnel syndrome, left upper limb: Secondary | ICD-10-CM | POA: Diagnosis not present

## 2015-08-04 DIAGNOSIS — M25632 Stiffness of left wrist, not elsewhere classified: Secondary | ICD-10-CM | POA: Diagnosis not present

## 2015-08-04 DIAGNOSIS — M62542 Muscle wasting and atrophy, not elsewhere classified, left hand: Secondary | ICD-10-CM | POA: Diagnosis not present

## 2015-08-09 DIAGNOSIS — Z4789 Encounter for other orthopedic aftercare: Secondary | ICD-10-CM | POA: Diagnosis not present

## 2015-08-09 DIAGNOSIS — G5601 Carpal tunnel syndrome, right upper limb: Secondary | ICD-10-CM | POA: Diagnosis not present

## 2015-08-10 DIAGNOSIS — G5601 Carpal tunnel syndrome, right upper limb: Secondary | ICD-10-CM | POA: Diagnosis not present

## 2015-08-14 ENCOUNTER — Other Ambulatory Visit: Payer: Self-pay | Admitting: Cardiology

## 2015-08-15 ENCOUNTER — Other Ambulatory Visit: Payer: Self-pay

## 2015-08-15 MED ORDER — CLONIDINE HCL 0.2 MG PO TABS: ORAL_TABLET | ORAL | Status: DC

## 2015-08-22 ENCOUNTER — Encounter: Payer: Self-pay | Admitting: Family Medicine

## 2015-08-22 ENCOUNTER — Ambulatory Visit (INDEPENDENT_AMBULATORY_CARE_PROVIDER_SITE_OTHER): Payer: Medicare Other | Admitting: Family Medicine

## 2015-08-22 VITALS — BP 128/70 | Ht 63.0 in | Wt 175.0 lb

## 2015-08-22 DIAGNOSIS — R21 Rash and other nonspecific skin eruption: Secondary | ICD-10-CM

## 2015-08-22 DIAGNOSIS — R7301 Impaired fasting glucose: Secondary | ICD-10-CM | POA: Diagnosis not present

## 2015-08-22 DIAGNOSIS — J310 Chronic rhinitis: Secondary | ICD-10-CM | POA: Diagnosis not present

## 2015-08-22 DIAGNOSIS — I10 Essential (primary) hypertension: Secondary | ICD-10-CM | POA: Diagnosis not present

## 2015-08-22 LAB — POCT GLYCOSYLATED HEMOGLOBIN (HGB A1C): HEMOGLOBIN A1C: 5.6

## 2015-08-22 MED ORDER — TRIAMCINOLONE ACETONIDE 0.1 % EX CREA: TOPICAL_CREAM | Freq: Two times a day (BID) | CUTANEOUS | Status: DC

## 2015-08-22 MED ORDER — ALBUTEROL SULFATE HFA 108 (90 BASE) MCG/ACT IN AERS: INHALATION_SPRAY | RESPIRATORY_TRACT | Status: DC

## 2015-08-22 NOTE — Progress Notes (Signed)
   Subjective:    Patient ID: [name], female    DOB: Oct 21, 1940, 75 y.o.   MRN: [id number]  Hypertension This is a chronic problem. The current episode started more than 1 year ago. There are no compliance problems (eats healthy and walks).    Impaired fasting glucose. A1C today. 5.6  Results for orders placed or performed in visit on 08/22/15  POCT glycosylated hemoglobin (Hb A1C)  Result Value Ref Range   Hemoglobin A1C 5.6    Both parent s had diabites, concerned about her sugar status. Watching sugar intake.  Post nasal drip for months, now since rhino virus, usually no alergy hx, a bit with age   Using vent q six mo prn for whezing   Needs refill on triamcinolone cream, hx of seborrhiec dermatitis, def hlps clms it down   and albuterol inhaler., Occasional wheezing. States this helps.  Having sinus drainage in throat. Started back in the fall.    Review of Systems No headache, no major weight loss or weight gain, no chest pain no back pain abdominal pain no change in bowel habits complete ROS otherwise negative     Objective:   Physical Exam  Alert HET mild nasal congestion slight drainage pharynx normal neck supple lungs clear heart regular in rhythm ankles without edema seborrheic dermatitis present on face      Assessment & Plan:  Impression 1 impaired fasting glucose under control discussed plan medications refilled. Diet exercise discussed. Recheck in 6 months WSL and Claritin as needed for chronic drainagediscussed him 2 hypertension good control maintain same meds discussed #3 reactive airways occasional challenge discussed him for seborrheic their dermatitis

## 2015-08-22 NOTE — Patient Instructions (Signed)
Try nightly claritin 10 mg each night

## 2015-08-25 DIAGNOSIS — G5601 Carpal tunnel syndrome, right upper limb: Secondary | ICD-10-CM | POA: Diagnosis not present

## 2015-08-25 DIAGNOSIS — Z4789 Encounter for other orthopedic aftercare: Secondary | ICD-10-CM | POA: Diagnosis not present

## 2015-08-30 ENCOUNTER — Other Ambulatory Visit: Payer: Self-pay | Admitting: Adult Health

## 2015-09-15 ENCOUNTER — Other Ambulatory Visit: Payer: Self-pay | Admitting: Adult Health

## 2015-09-23 DIAGNOSIS — G5601 Carpal tunnel syndrome, right upper limb: Secondary | ICD-10-CM | POA: Diagnosis not present

## 2015-09-23 DIAGNOSIS — Z4789 Encounter for other orthopedic aftercare: Secondary | ICD-10-CM | POA: Diagnosis not present

## 2015-11-02 ENCOUNTER — Other Ambulatory Visit: Payer: Self-pay | Admitting: Cardiology

## 2015-12-05 ENCOUNTER — Other Ambulatory Visit: Payer: Self-pay | Admitting: Family Medicine

## 2015-12-05 DIAGNOSIS — Z1231 Encounter for screening mammogram for malignant neoplasm of breast: Secondary | ICD-10-CM

## 2016-01-09 DIAGNOSIS — D3132 Benign neoplasm of left choroid: Secondary | ICD-10-CM | POA: Diagnosis not present

## 2016-01-09 DIAGNOSIS — H1851 Endothelial corneal dystrophy: Secondary | ICD-10-CM | POA: Diagnosis not present

## 2016-01-09 DIAGNOSIS — H2513 Age-related nuclear cataract, bilateral: Secondary | ICD-10-CM | POA: Diagnosis not present

## 2016-01-09 DIAGNOSIS — H04123 Dry eye syndrome of bilateral lacrimal glands: Secondary | ICD-10-CM | POA: Diagnosis not present

## 2016-01-12 ENCOUNTER — Ambulatory Visit (HOSPITAL_COMMUNITY)
Admission: RE | Admit: 2016-01-12 | Discharge: 2016-01-12 | Disposition: A | Discharge status: Home / Self Care | Payer: Medicare Other | Source: Ambulatory Visit | Attending: Family Medicine | Admitting: Family Medicine

## 2016-01-12 ENCOUNTER — Ambulatory Visit (HOSPITAL_COMMUNITY): Payer: Medicare Other

## 2016-01-12 DIAGNOSIS — Z1231 Encounter for screening mammogram for malignant neoplasm of breast: Secondary | ICD-10-CM | POA: Insufficient documentation

## 2016-02-14 ENCOUNTER — Ambulatory Visit (INDEPENDENT_AMBULATORY_CARE_PROVIDER_SITE_OTHER): Payer: Medicare Other | Admitting: Cardiology

## 2016-02-14 ENCOUNTER — Encounter: Payer: Self-pay | Admitting: Cardiology

## 2016-02-14 VITALS — BP 138/68 | HR 70 | Ht 63.0 in | Wt 173.0 lb

## 2016-02-14 DIAGNOSIS — R600 Localized edema: Secondary | ICD-10-CM | POA: Diagnosis not present

## 2016-02-14 DIAGNOSIS — Z23 Encounter for immunization: Secondary | ICD-10-CM | POA: Diagnosis not present

## 2016-02-14 DIAGNOSIS — I10 Essential (primary) hypertension: Secondary | ICD-10-CM

## 2016-02-14 MED ORDER — CLONIDINE HCL 0.1 MG PO TABS: 0.1000 mg | ORAL_TABLET | Freq: Two times a day (BID) | ORAL | 11 refills | Status: DC

## 2016-02-14 NOTE — Patient Instructions (Signed)
Your physician wants you to follow-up in:  6 months You will receive a reminder letter in the mail two months in advance. If you don't receive a letter, please call our office to schedule the follow-up appointment.   Get FASTING lab work done     DECREASE Clonidine to 0.1 mg twice a day      Thank you for choosing [facility] !

## 2016-02-14 NOTE — Progress Notes (Signed)
Clinical Summary Ms. [name] is a 75 y.o.female seen today for follow up of the following medical problems.  1.HTN  - reports diagnosed at age 81, she states has always been difficult to control  - bp log 07/2014 with borderline low bp's and heart rates, lopressor decreased to 12.5mg  bid - history of white coat HTN, often clinic bp's elevated while home numbers at goal  - home log shows bp 100s/50-60s - compliant with meds  2.LE edema - started several years ago - echo LVEF 65-70%, grade I - no recent troubles   Past Medical History:  Diagnosis Date  . Allergic rhinitis   . Arthritis   . Asthma   . Hypertension   . IFG (impaired fasting glucose)   . Osteopenia   . Spinal stenosis      Allergies  Allergen Reactions  . Levaquin [Levofloxacin In D5w]     Sleep disturbance  . Detrol [Tolterodine]     Abdominal pain     Current Outpatient Prescriptions  Medication Sig Dispense Refill  . albuterol (VENTOLIN HFA) 108 (90 Base) MCG/ACT inhaler INHALE TWO PUFFS BY MOUTH EVERY 6 HOURS AS NEEDED FOR WHEEZING 18 each 5  . amLODipine-benazepril (LOTREL) 10-40 MG capsule TAKE 1 CAPSULE DAILY 90 capsule 2  . aspirin EC 81 MG tablet Take 81 mg by mouth daily.    . Calcium Carbonate-Vit D-Min (CALCIUM 1200 PO) Take 1 tablet by mouth 2 (two) times daily.     . carboxymethylcellulose (REFRESH PLUS) 0.5 % SOLN Place 1 drop into both eyes 2 (two) times daily as needed. Dry eyes    . chlorthalidone (HYGROTON) 25 MG tablet Take 12.5 mg by mouth daily. Taking 12.5 mg daily (1/2 tablet)    . chlorthalidone (HYGROTON) 50 MG tablet TAKE 1 TABLET DAILY 90 tablet 3  . cloNIDine (CATAPRES) 0.2 MG tablet 1 tablet twice a day 180 tablet 3  . metoprolol tartrate (LOPRESSOR) 25 MG tablet TAKE ONE-HALF (1/2) TABLET TWICE A DAY (DOSE DECREASE) 90 tablet 3  . potassium chloride SA (K-DUR,KLOR-CON) 20 MEQ tablet TAKE 1 TABLET DAILY 90 tablet 2  . triamcinolone cream (KENALOG) 0.1 % Apply  topically 2 (two) times daily. 15 g 5   No current facility-administered medications for this visit.      Past Surgical History:  Procedure Laterality Date  . ABDOMINAL HYSTERECTOMY    . BACK SURGERY    . CHOLECYSTECTOMY    . KNEE ARTHROSCOPY Left   . TOTAL ABDOMINAL HYSTERECTOMY W/ BILATERAL SALPINGOOPHORECTOMY       Allergies  Allergen Reactions  . Levaquin [Levofloxacin In D5w]     Sleep disturbance  . Detrol [Tolterodine]     Abdominal pain      Family History  Problem Relation Age of Onset  . Diabetes Mother   . Hypertension Mother   . Diabetes Father   . Hypertension Father      Social History Ms. Carswell reports that she has never smoked. She has never used smokeless tobacco. Ms. Bartosik has no alcohol history on file.   Review of Systems CONSTITUTIONAL: No weight loss, fever, chills, weakness or fatigue.  HEENT: Eyes: No visual loss, blurred vision, double vision or yellow sclerae.No hearing loss, sneezing, congestion, runny nose or sore throat.  SKIN: No rash or itching.  CARDIOVASCULAR: no chest pain, no palpitations RESPIRATORY: No shortness of breath, cough or sputum.  GASTROINTESTINAL: No anorexia, nausea, vomiting or diarrhea. No abdominal pain or blood.  GENITOURINARY:  No burning on urination, no polyuria NEUROLOGICAL: No headache, dizziness, syncope, paralysis, ataxia, numbness or tingling in the extremities. No change in bowel or bladder control.  MUSCULOSKELETAL: No muscle, back pain, joint pain or stiffness.  LYMPHATICS: No enlarged nodes. No history of splenectomy.  PSYCHIATRIC: No history of depression or anxiety.  ENDOCRINOLOGIC: No reports of sweating, cold or heat intolerance. No polyuria or polydipsia.  Marland Kitchen   Physical Examination Vitals:   02/14/16 1015  BP: 138/68  Pulse: 70   Vitals:   02/14/16 1015  Weight: 173 lb (78.5 kg)  Height: 5\' 3"  (1.6 m)    Gen: resting comfortably, no acute distress HEENT: no scleral icterus,  pupils equal round and reactive, no palptable cervical adenopathy,  CV: RRR, no m/r/g, no jvd Resp: Clear to auscultation bilaterally GI: abdomen is soft, non-tender, non-distended, normal bowel sounds, no hepatosplenomegaly MSK: extremities are warm, no edema.  Skin: warm, no rash Neuro:  no focal deficits Psych: appropriate affect   Diagnostic Studies 01/2014 echo Study Conclusions  - Left ventricle: The cavity size was normal. Wall thickness was increased in a pattern of mild LVH. Systolic function was vigorous. The estimated ejection fraction was in the range of 65% to 70%. There was dynamic obstruction during Valsalva in the mid cavity caused by vigorous LV systolic function, with a peak velocity of 393 cm/sec and a peak gradient of 62 mm Hg. Doppler parameters are consistent with abnormal left ventricular relaxation (grade 1 diastolic dysfunction). - Left atrium: The atrium was mildly dilated. - Pulmonary arteries: PA peak pressure: 39 mm Hg (S). PASP is borderline elevated. - Systemic veins: The IVC is small, suggestive of low RA pressure and hypovolemia. - Technically adequate study.    Assessment and Plan  1. HTN  - history of difficult to control HTN. Has component of white coat HTN, often clinic numbers elevated and home numbers at goal - home numbers on low end of normal, we will lower clonidine to 0.1mg  bid  2. LE edema - controlled, contiue current meds   EKG in clinic NSR  F/u 6 months. Order annual labs      Arnoldo Lenis, M.D.

## 2016-02-17 DIAGNOSIS — I10 Essential (primary) hypertension: Secondary | ICD-10-CM | POA: Diagnosis not present

## 2016-02-17 LAB — COMPREHENSIVE METABOLIC PANEL
ALT: 11 U/L (ref 6–29)
AST: 20 U/L (ref 10–35)
Albumin: 3.9 g/dL (ref 3.6–5.1)
Alkaline Phosphatase: 60 U/L (ref 33–130)
BUN: 18 mg/dL (ref 7–25)
CALCIUM: 10 mg/dL (ref 8.6–10.4)
CHLORIDE: 106 mmol/L (ref 98–110)
CO2: 25 mmol/L (ref 20–31)
Creat: 1.02 mg/dL — ABNORMAL HIGH (ref 0.60–0.93)
GLUCOSE: 109 mg/dL — AB (ref 65–99)
POTASSIUM: 4.1 mmol/L (ref 3.5–5.3)
Sodium: 139 mmol/L (ref 135–146)
Total Bilirubin: 0.9 mg/dL (ref 0.2–1.2)
Total Protein: 7.3 g/dL (ref 6.1–8.1)

## 2016-02-17 LAB — CHOLESTEROL, TOTAL: Cholesterol: 153 mg/dL (ref 125–200)

## 2016-02-17 LAB — TSH: TSH: 0.69 m[IU]/L

## 2016-02-17 LAB — HEMOGLOBIN A1C
Hgb A1c MFr Bld: 5.8 % — ABNORMAL HIGH (ref <5.7)
Mean Plasma Glucose: 120 mg/dL

## 2016-02-17 LAB — CBC
HCT: 38.8 % (ref 35.0–45.0)
Hemoglobin: 13.5 g/dL (ref 11.7–15.5)
MCH: 30.5 pg (ref 27.0–33.0)
MCHC: 34.8 g/dL (ref 32.0–36.0)
MCV: 87.6 fL (ref 80.0–100.0)
MPV: 9.7 fL (ref 7.5–12.5)
PLATELETS: 328 10*3/uL (ref 140–400)
RBC: 4.43 MIL/uL (ref 3.80–5.10)
RDW: 12.6 % (ref 11.0–15.0)
WBC: 6.6 10*3/uL (ref 3.8–10.8)

## 2016-02-17 LAB — MAGNESIUM: MAGNESIUM: 1.9 mg/dL (ref 1.5–2.5)

## 2016-02-20 ENCOUNTER — Encounter: Payer: Self-pay | Admitting: Family Medicine

## 2016-02-20 ENCOUNTER — Ambulatory Visit (INDEPENDENT_AMBULATORY_CARE_PROVIDER_SITE_OTHER): Payer: Medicare Other | Admitting: Family Medicine

## 2016-02-20 VITALS — BP 136/74 | Ht 63.0 in | Wt 171.2 lb

## 2016-02-20 DIAGNOSIS — I10 Essential (primary) hypertension: Secondary | ICD-10-CM

## 2016-02-20 DIAGNOSIS — R7301 Impaired fasting glucose: Secondary | ICD-10-CM | POA: Diagnosis not present

## 2016-02-20 DIAGNOSIS — J310 Chronic rhinitis: Secondary | ICD-10-CM | POA: Diagnosis not present

## 2016-02-20 NOTE — Progress Notes (Signed)
Subjective:    Patient ID: [name], female    DOB: Sep 22, 1940, 75 y.o.   MRN: [id number]  Hypertension  This is a chronic problem. The current episode started more than 1 year ago. There are no compliance problems.   Blood pressure medicine and blood pressure levels reviewed today with patient. Compliant with blood pressure medicine. States does not miss a dose. No obvious side effects. Blood pressure generally good when checked elsewhere. Watching salt intake.  Patient states only rare use of inhaler. Occasional wheezing. Generally under good control.  Claims compliance with diet. Has had history of impaired fasting glucose in the past. A1c the spring 5.6. A1c recently checked 5.8  Patient continues to have foot drop and uses a rolling walker for this    Blood pressure medicine was just recently changed by the patient Patient states no other concerns this visit.  Results for orders placed or performed in visit on 02/14/16  CBC  Result Value Ref Range   WBC 6.6 3.8 - 10.8 K/uL   RBC 4.43 3.80 - 5.10 MIL/uL   Hemoglobin 13.5 11.7 - 15.5 g/dL   HCT 38.8 35.0 - 45.0 %   MCV 87.6 80.0 - 100.0 fL   MCH 30.5 27.0 - 33.0 pg   MCHC 34.8 32.0 - 36.0 g/dL   RDW 12.6 11.0 - 15.0 %   Platelets 328 140 - 400 K/uL   MPV 9.7 7.5 - 12.5 fL  Comprehensive metabolic panel  Result Value Ref Range   Sodium 139 135 - 146 mmol/L   Potassium 4.1 3.5 - 5.3 mmol/L   Chloride 106 98 - 110 mmol/L   CO2 25 20 - 31 mmol/L   Glucose, Bld 109 (H) 65 - 99 mg/dL   BUN 18 7 - 25 mg/dL   Creat 1.02 (H) 0.60 - 0.93 mg/dL   Total Bilirubin 0.9 0.2 - 1.2 mg/dL   Alkaline Phosphatase 60 33 - 130 U/L   AST 20 10 - 35 U/L   ALT 11 6 - 29 U/L   Total Protein 7.3 6.1 - 8.1 g/dL   Albumin 3.9 3.6 - 5.1 g/dL   Calcium 10.0 8.6 - 10.4 mg/dL  HgB A1c  Result Value Ref Range   Hgb A1c MFr Bld 5.8 (H) <5.7 %   Mean Plasma Glucose 120 mg/dL  Magnesium  Result Value Ref Range   Magnesium 1.9 1.5 - 2.5  mg/dL  TSH  Result Value Ref Range   TSH 0.69 mIU/L  Cholesterol, Total  Result Value Ref Range   Cholesterol 153 125 - 200 mg/dL   Flu shot    Numbers still good   Watching diet closely, has cut down sugars  Not using inhaler beathing ovdall good    Pt exercising reg, gives self an a, stuck with walker now due to nerropatyy    Review of Systems No headache, no major weight loss or weight gain, no chest pain no back pain abdominal pain no change in bowel habits complete ROS otherwise negative     Objective:   Physical Exam Alert vitals stable, NAD. Blood pressure good on repeat. HEENT normal. Lungs clear. Heart regular rate and rhythm. Ankles trace edema       Assessment & Plan:  Impression 1 prediabetes overall good control discussed in detail keep working on sugar and diet and weight loss of possible #2 hypertension good control discussed maintain same meds #3 chronic rhinitis discussed maintain same #4 chronic foot drop  patient maintain use of walker #5 reactive airways clinically stable this time

## 2016-03-01 ENCOUNTER — Ambulatory Visit (INDEPENDENT_AMBULATORY_CARE_PROVIDER_SITE_OTHER): Payer: Medicare Other | Admitting: Nurse Practitioner

## 2016-03-01 ENCOUNTER — Encounter: Payer: Self-pay | Admitting: Nurse Practitioner

## 2016-03-01 VITALS — BP 148/74 | Temp 99.4°F | Ht 63.0 in | Wt 170.0 lb

## 2016-03-01 DIAGNOSIS — B9689 Other specified bacterial agents as the cause of diseases classified elsewhere: Secondary | ICD-10-CM | POA: Diagnosis not present

## 2016-03-01 DIAGNOSIS — J069 Acute upper respiratory infection, unspecified: Secondary | ICD-10-CM

## 2016-03-01 MED ORDER — CEFDINIR 300 MG PO CAPS: 300.0000 mg | ORAL_CAPSULE | Freq: Two times a day (BID) | ORAL | 0 refills | Status: DC

## 2016-03-02 ENCOUNTER — Encounter: Payer: Self-pay | Admitting: Nurse Practitioner

## 2016-03-02 NOTE — Progress Notes (Signed)
Subjective:  Presents for complaints of cold symptoms for the past 3 days. Low-grade fever. Sore throat. Generalized headache. Runny nose. Frequent cough, initially producing brown mucus now is yellow. Slight wheezing at times. Used her albuterol inhaler about every 6 hours starting yesterday. No ear pain. No vomiting diarrhea or abdominal pain. Taking fluids well. Voiding normal limit.  Objective:   BP (!) 148/74   Temp 99.4 F (37.4 C) (Oral)   Ht 5\' 3"  (1.6 m)   Wt 170 lb (77.1 kg)   BMI 30.11 kg/m  NAD. Alert, oriented. TMs clear effusion, no erythema. Pharynx injected with PND noted. Neck supple with mild soft anterior adenopathy. Lungs clear. Heart regular rate rhythm.  Assessment: Bacterial upper respiratory infection  Plan:  Meds ordered this encounter  Medications  . cefdinir (OMNICEF) 300 MG capsule    Sig: Take 1 capsule (300 mg total) by mouth 2 (two) times daily.    Dispense:  20 capsule    Refill:  0    Order Specific Question:   Supervising Provider    Answer:   Mikey Kirschner [[id number]]   Mucinex DM as directed for congestion and cough. Warning signs reviewed. Call back in 4-5 days if no improvement, call or go to ED sooner if worse.

## 2016-05-26 ENCOUNTER — Other Ambulatory Visit: Payer: Self-pay | Admitting: Cardiology

## 2016-06-11 ENCOUNTER — Other Ambulatory Visit: Payer: Self-pay | Admitting: Cardiology

## 2016-07-24 ENCOUNTER — Ambulatory Visit: Payer: Medicare Other | Admitting: Cardiology

## 2016-08-20 ENCOUNTER — Ambulatory Visit (INDEPENDENT_AMBULATORY_CARE_PROVIDER_SITE_OTHER): Payer: Medicare HMO | Admitting: Family Medicine

## 2016-08-20 ENCOUNTER — Encounter: Payer: Self-pay | Admitting: Family Medicine

## 2016-08-20 VITALS — BP 190/94 | Ht 63.0 in | Wt 179.0 lb

## 2016-08-20 DIAGNOSIS — M21379 Foot drop, unspecified foot: Secondary | ICD-10-CM | POA: Diagnosis not present

## 2016-08-20 DIAGNOSIS — J4520 Mild intermittent asthma, uncomplicated: Secondary | ICD-10-CM | POA: Diagnosis not present

## 2016-08-20 DIAGNOSIS — I10 Essential (primary) hypertension: Secondary | ICD-10-CM

## 2016-08-20 DIAGNOSIS — R7301 Impaired fasting glucose: Secondary | ICD-10-CM

## 2016-08-20 MED ORDER — ALBUTEROL SULFATE HFA 108 (90 BASE) MCG/ACT IN AERS: INHALATION_SPRAY | RESPIRATORY_TRACT | 5 refills | Status: DC

## 2016-08-20 NOTE — Progress Notes (Signed)
   Subjective:    Patient ID: [name], female    DOB: [birth year], 76 y.o.   MRN: [id number]  Hypertension  This is a chronic problem. The current episode started more than 1 year ago. Compliance problems include exercise.   Blood pressure medicine and blood pressure levels reviewed today with patient. Compliant with blood pressure medicine. States does not miss a dose. No obvious side effects. Blood pressure generally good when checked elsewhere. Watching salt intake.   Patient deals with chronic foot drop. Wears brace. Uses walker. Overall this treats her reasonably well.  Occasional wheezing/reactive airways. Perhaps use his inhaler once a month. No obvious side effects with it handles it well.   Review of Systems No headache, no major weight loss or weight gain, no chest pain no back pain abdominal pain no change in bowel habits complete ROS otherwise negative     Objective:   Physical Exam  Alert vitals stable, NAD. Blood pressure good on repeat. HEENT normal. Lungs clear. Heart regular rate and rhythm. Foot drop evident no wheezes at this time Alert and oriented, vitals reviewed and stable, NAD ENT-TM's and ext canals WNL bilat via otoscopic exam Soft palate, tonsils and post pharynx WNL via oropharyngeal exam Neck-symmetric, no masses; thyroid nonpalpable and nontender Pulmonary-no tachypnea or accessory muscle use; Clear without wheezes via auscultation Card--no abnrml murmurs, rhythm reg and rate WNL Carotid pulses symmetric, without bruits       Assessment & Plan:  Impression 1 hypertension recently good control discussed maintain same meds #2 history of foot drop clinically stable uses walker. #3 reactive airways just occasional not severe discussed proper use an inhaler Plan diet exercise discussed. Medications refilled.

## 2016-08-24 ENCOUNTER — Ambulatory Visit (INDEPENDENT_AMBULATORY_CARE_PROVIDER_SITE_OTHER): Payer: Medicare HMO | Admitting: Cardiology

## 2016-08-24 ENCOUNTER — Encounter: Payer: Self-pay | Admitting: Cardiology

## 2016-08-24 VITALS — BP 118/60 | HR 72 | Ht 63.0 in | Wt 174.0 lb

## 2016-08-24 DIAGNOSIS — R600 Localized edema: Secondary | ICD-10-CM | POA: Diagnosis not present

## 2016-08-24 DIAGNOSIS — I10 Essential (primary) hypertension: Secondary | ICD-10-CM | POA: Diagnosis not present

## 2016-08-24 NOTE — Progress Notes (Signed)
Clinical Summary Ms. Zachman is a 76 y.o.female seen today for follow up of the following medical problems.  1.HTN  - reports diagnosed at age 66, she states has always been difficult to control  - bp log 07/2014 with borderline low bp's and heart rates, lopressor decreased to 12.5mg  bid - history of white coat HTN, often clinic bp's elevated while home numbers at goal  - compliant with meds  2.LE edema - started several years ago - echo LVEF 65-70%, grade I  - no recent leg swelling. Controlled with compression stockings and chlorthalidone.    Upcoming labs with pcp Past Medical History:  Diagnosis Date  . Allergic rhinitis   . Arthritis   . Asthma   . Hypertension   . IFG (impaired fasting glucose)   . Osteopenia   . Spinal stenosis      Allergies  Allergen Reactions  . Levaquin [Levofloxacin In D5w]     Sleep disturbance  . Detrol [Tolterodine]     Abdominal pain     Current Outpatient Prescriptions  Medication Sig Dispense Refill  . albuterol (VENTOLIN HFA) 108 (90 Base) MCG/ACT inhaler INHALE TWO PUFFS BY MOUTH EVERY 6 HOURS AS NEEDED FOR WHEEZING 18 each 5  . amLODipine-benazepril (LOTREL) 10-40 MG capsule TAKE 1 CAPSULE DAILY 90 capsule 2  . aspirin EC 81 MG tablet Take 81 mg by mouth daily.    . Calcium Carbonate-Vit D-Min (CALCIUM 1200 PO) Take 1 tablet by mouth 2 (two) times daily.     . carboxymethylcellulose (REFRESH PLUS) 0.5 % SOLN Place 1 drop into both eyes 2 (two) times daily as needed. Dry eyes    . chlorthalidone (HYGROTON) 50 MG tablet TAKE 1 TABLET DAILY (Patient taking differently: TAKE 1/2 (25 mg) TABLET DAILY) 90 tablet 3  . cloNIDine (CATAPRES) 0.1 MG tablet Take 1 tablet (0.1 mg total) by mouth 2 (two) times daily. 60 tablet 11  . metoprolol tartrate (LOPRESSOR) 25 MG tablet TAKE ONE-HALF (1/2) TABLET TWICE A DAY (DOSE DECREASE) 90 tablet 3  . potassium chloride SA (K-DUR,KLOR-CON) 20 MEQ tablet TAKE 1 TABLET DAILY 90 tablet 2  .  triamcinolone cream (KENALOG) 0.1 % Apply topically 2 (two) times daily. 15 g 5   No current facility-administered medications for this visit.      Past Surgical History:  Procedure Laterality Date  . ABDOMINAL HYSTERECTOMY    . BACK SURGERY    . CHOLECYSTECTOMY    . KNEE ARTHROSCOPY Left   . TOTAL ABDOMINAL HYSTERECTOMY W/ BILATERAL SALPINGOOPHORECTOMY       Allergies  Allergen Reactions  . Levaquin [Levofloxacin In D5w]     Sleep disturbance  . Detrol [Tolterodine]     Abdominal pain      Family History  Problem Relation Age of Onset  . Diabetes Mother   . Hypertension Mother   . Diabetes Father   . Hypertension Father      Social History Ms. Toscano reports that she has never smoked. She has never used smokeless tobacco. Ms. Joshi has no alcohol history on file.   Review of Systems CONSTITUTIONAL: No weight loss, fever, chills, weakness or fatigue.  HEENT: Eyes: No visual loss, blurred vision, double vision or yellow sclerae.No hearing loss, sneezing, congestion, runny nose or sore throat.  SKIN: No rash or itching.  CARDIOVASCULAR: per hpi RESPIRATORY: No shortness of breath, cough or sputum.  GASTROINTESTINAL: No anorexia, nausea, vomiting or diarrhea. No abdominal pain or blood.  GENITOURINARY: No  burning on urination, no polyuria NEUROLOGICAL: No headache, dizziness, syncope, paralysis, ataxia, numbness or tingling in the extremities. No change in bowel or bladder control.  MUSCULOSKELETAL: No muscle, back pain, joint pain or stiffness.  LYMPHATICS: No enlarged nodes. No history of splenectomy.  PSYCHIATRIC: No history of depression or anxiety.  ENDOCRINOLOGIC: No reports of sweating, cold or heat intolerance. No polyuria or polydipsia.  Marland Kitchen   Physical Examination Vitals:   08/24/16 1144  BP: 118/60  Pulse: 72   Vitals:   08/24/16 1144  Weight: 174 lb (78.9 kg)  Height: 5\' 3"  (1.6 m)    Gen: resting comfortably, no acute distress HEENT: no  scleral icterus, pupils equal round and reactive, no palptable cervical adenopathy,  CV: RRR, no m/r/g, no jvd Resp: Clear to auscultation bilaterally GI: abdomen is soft, non-tender, non-distended, normal bowel sounds, no hepatosplenomegaly MSK: extremities are warm, no edema.  Skin: warm, no rash Neuro:  no focal deficits Psych: appropriate affect   Diagnostic Studies 01/2014 echo Study Conclusions  - Left ventricle: The cavity size was normal. Wall thickness was increased in a pattern of mild LVH. Systolic function was vigorous. The estimated ejection fraction was in the range of 65% to 70%. There was dynamic obstruction during Valsalva in the mid cavity caused by vigorous LV systolic function, with a peak velocity of 393 cm/sec and a peak gradient of 62 mm Hg. Doppler parameters are consistent with abnormal left ventricular relaxation (grade 1 diastolic dysfunction). - Left atrium: The atrium was mildly dilated. - Pulmonary arteries: PA peak pressure: 39 mm Hg (S). PASP is borderline elevated. - Systemic veins: The IVC is small, suggestive of low RA pressure and hypovolemia. - Technically adequate study.     Assessment and Plan   1. HTN  - history of difficult to control HTN. Has component of white coat HTN, often clinic numbers elevated and home numbers at goal - currently at goal, continue current meds  2. LE edema - doing well, continue current therapy.    F/u 6 months     Arnoldo Lenis, M.D

## 2016-08-24 NOTE — Patient Instructions (Signed)

## 2016-10-15 ENCOUNTER — Other Ambulatory Visit: Payer: Self-pay | Admitting: Cardiology

## 2016-10-27 ENCOUNTER — Other Ambulatory Visit: Payer: Self-pay | Admitting: Cardiology

## 2016-12-26 ENCOUNTER — Other Ambulatory Visit: Payer: Self-pay | Admitting: Family Medicine

## 2016-12-26 DIAGNOSIS — Z1231 Encounter for screening mammogram for malignant neoplasm of breast: Secondary | ICD-10-CM

## 2017-01-01 DIAGNOSIS — M542 Cervicalgia: Secondary | ICD-10-CM | POA: Diagnosis not present

## 2017-01-01 DIAGNOSIS — M5031 Other cervical disc degeneration,  high cervical region: Secondary | ICD-10-CM | POA: Diagnosis not present

## 2017-01-16 ENCOUNTER — Ambulatory Visit (HOSPITAL_COMMUNITY)
Admission: RE | Admit: 2017-01-16 | Discharge: 2017-01-16 | Disposition: A | Discharge status: Home / Self Care | Payer: Medicare HMO | Source: Ambulatory Visit | Attending: Family Medicine | Admitting: Family Medicine

## 2017-01-16 DIAGNOSIS — Z1231 Encounter for screening mammogram for malignant neoplasm of breast: Secondary | ICD-10-CM

## 2017-02-18 ENCOUNTER — Ambulatory Visit: Payer: Medicare HMO | Admitting: Family Medicine

## 2017-02-20 ENCOUNTER — Ambulatory Visit: Payer: Medicare HMO | Admitting: Family Medicine

## 2017-02-20 ENCOUNTER — Other Ambulatory Visit: Payer: Self-pay | Admitting: Cardiology

## 2017-02-21 ENCOUNTER — Ambulatory Visit (INDEPENDENT_AMBULATORY_CARE_PROVIDER_SITE_OTHER): Payer: Medicare HMO | Admitting: Family Medicine

## 2017-02-21 ENCOUNTER — Encounter: Payer: Self-pay | Admitting: Family Medicine

## 2017-02-21 VITALS — BP 136/78 | Ht 63.0 in | Wt 178.2 lb

## 2017-02-21 DIAGNOSIS — R7301 Impaired fasting glucose: Secondary | ICD-10-CM

## 2017-02-21 DIAGNOSIS — Z79899 Other long term (current) drug therapy: Secondary | ICD-10-CM

## 2017-02-21 DIAGNOSIS — M21379 Foot drop, unspecified foot: Secondary | ICD-10-CM

## 2017-02-21 DIAGNOSIS — I10 Essential (primary) hypertension: Secondary | ICD-10-CM

## 2017-02-21 DIAGNOSIS — Z23 Encounter for immunization: Secondary | ICD-10-CM | POA: Diagnosis not present

## 2017-02-21 LAB — POCT GLYCOSYLATED HEMOGLOBIN (HGB A1C): Hemoglobin A1C: 5.9

## 2017-02-21 NOTE — Progress Notes (Signed)
   Subjective:    Patient ID: [name], female    DOB: 29-Jan-1941, 76 y.o.   MRN: [id number]  Hypertension  This is a chronic problem. The current episode started more than 1 year ago.   Blood pressure medicine and blood pressure levels reviewed today with patient. Compliant with blood pressure medicine. States does not miss a dose. No obvious side effects. Blood pressure generally good when checked elsewhere. Watching salt intake.  Staying fairly active. Has hx of foot drop. [place] and walks  Arthritis acting up, has stiff neck, works on sleeping and pillows special for nieck   No sig problems with heartburn  Hips hurting some too   Patient states no concerns this visit.  seborrhic drmatitis, triam cin helps, needs more, partic n face  las colon done 2011  Results for orders placed or performed in visit on 02/21/17  POCT HgB A1C  Result Value Ref Range   Hemoglobin A1C 5.9    History of impaired fasting glucose. Watching sugar intake. Positive family history diabetes Review of Systems No headache, no major weight loss or weight gain, no chest pain no back pain abdominal pain no change in bowel habits complete ROS otherwise negative     Objective:   Physical Exam  Alert and oriented, vitals reviewed and stable, NAD ENT-TM's and ext canals WNL bilat via otoscopic exam Soft palate, tonsils and post pharynx WNL via oropharyngeal exam Neck-symmetric, no masses; thyroid nonpalpable and nontender Pulmonary-no tachypnea or accessory muscle use; Clear without wheezes via auscultation Card--no abnrml murmurs, rhythm reg and rate WNL Carotid pulses symmetric, without bruits Patient using a walker. Foot drop e      Assessment & Plan:  Impression 1 hypertension control improved on repeat maintain same meds rationalescussed  #2 prediabetes. A1c excellent. New nomenclature discussed.  33 foot drop with diminished mobility and arthritis. Symptom controse  discussed.  Greater than 50% of this 25 minute face to face visit was spent in counseling and discussion and coordination of care regarding the above diagnosis/diagnosies

## 2017-02-22 LAB — LIPID PANEL
CHOLESTEROL TOTAL: 164 mg/dL (ref 100–199)
Chol/HDL Ratio: 3.1 ratio (ref 0.0–4.4)
HDL: 53 mg/dL (ref >39)
LDL Calculated: 96 mg/dL (ref 0–99)
Triglycerides: 74 mg/dL (ref 0–149)
VLDL CHOLESTEROL CAL: 15 mg/dL (ref 5–40)

## 2017-02-22 LAB — BASIC METABOLIC PANEL
BUN / CREAT RATIO: 27 (ref 12–28)
BUN: 29 mg/dL — ABNORMAL HIGH (ref 8–27)
CO2: 21 mmol/L (ref 20–29)
CREATININE: 1.08 mg/dL — AB (ref 0.57–1.00)
Calcium: 10.2 mg/dL (ref 8.7–10.3)
Chloride: 104 mmol/L (ref 96–106)
GFR, EST AFRICAN AMERICAN: 58 mL/min/{1.73_m2} — AB (ref >59)
GFR, EST NON AFRICAN AMERICAN: 50 mL/min/{1.73_m2} — AB (ref >59)
Glucose: 102 mg/dL — ABNORMAL HIGH (ref 65–99)
POTASSIUM: 5.1 mmol/L (ref 3.5–5.2)
SODIUM: 140 mmol/L (ref 134–144)

## 2017-02-22 LAB — HEPATIC FUNCTION PANEL
ALBUMIN: 4.2 g/dL (ref 3.5–4.8)
ALK PHOS: 88 IU/L (ref 39–117)
ALT: 9 IU/L (ref 0–32)
AST: 21 IU/L (ref 0–40)
BILIRUBIN TOTAL: 0.5 mg/dL (ref 0.0–1.2)
BILIRUBIN, DIRECT: 0.12 mg/dL (ref 0.00–0.40)
TOTAL PROTEIN: 7.7 g/dL (ref 6.0–8.5)

## 2017-02-24 ENCOUNTER — Encounter: Payer: Self-pay | Admitting: Family Medicine

## 2017-02-25 ENCOUNTER — Telehealth: Payer: Self-pay | Admitting: Family Medicine

## 2017-02-25 ENCOUNTER — Other Ambulatory Visit: Payer: Self-pay | Admitting: *Deleted

## 2017-02-25 MED ORDER — TRIAMCINOLONE ACETONIDE 0.1 % EX CREA: TOPICAL_CREAM | Freq: Two times a day (BID) | CUTANEOUS | 5 refills | Status: DC

## 2017-02-25 NOTE — Telephone Encounter (Signed)
Med sent to pharm. Pt notified.  

## 2017-02-25 NOTE — Telephone Encounter (Signed)
Ok plus six ref

## 2017-02-25 NOTE — Telephone Encounter (Signed)
Patient was seen 10/11 and forgot to ask for refill on kenalog cream. Call into [facility] [place].

## 2017-02-25 NOTE — Telephone Encounter (Signed)
Uses for seborrheic dermatitis

## 2017-03-10 ENCOUNTER — Other Ambulatory Visit: Payer: Self-pay | Admitting: Cardiology

## 2017-03-15 ENCOUNTER — Telehealth: Payer: Self-pay | Admitting: Family Medicine

## 2017-03-15 NOTE — Telephone Encounter (Signed)
Pt is wanting to know the results to her recent labwork.

## 2017-03-17 NOTE — Telephone Encounter (Signed)
[name] I dict letter on oct 14th and it shows not sent until oct 30, pt should have by now make sure plz

## 2017-03-18 NOTE — Telephone Encounter (Signed)
I called and left a vm.Asked that she r/c.

## 2017-03-18 NOTE — Telephone Encounter (Signed)
Patient states she did receive them.

## 2017-05-21 ENCOUNTER — Other Ambulatory Visit: Payer: Self-pay | Admitting: Cardiology

## 2017-08-19 ENCOUNTER — Other Ambulatory Visit: Payer: Self-pay | Admitting: Cardiology

## 2017-08-22 ENCOUNTER — Ambulatory Visit: Payer: Medicare HMO | Admitting: Family Medicine

## 2017-08-22 ENCOUNTER — Telehealth: Payer: Self-pay | Admitting: *Deleted

## 2017-08-22 ENCOUNTER — Encounter: Payer: Self-pay | Admitting: Family Medicine

## 2017-08-22 VITALS — BP 142/76 | Ht 63.0 in | Wt 186.0 lb

## 2017-08-22 DIAGNOSIS — M21379 Foot drop, unspecified foot: Secondary | ICD-10-CM | POA: Diagnosis not present

## 2017-08-22 DIAGNOSIS — J4520 Mild intermittent asthma, uncomplicated: Secondary | ICD-10-CM

## 2017-08-22 DIAGNOSIS — I10 Essential (primary) hypertension: Secondary | ICD-10-CM

## 2017-08-22 DIAGNOSIS — R7301 Impaired fasting glucose: Secondary | ICD-10-CM | POA: Diagnosis not present

## 2017-08-22 NOTE — Telephone Encounter (Signed)
Pt notified that next colonscopy due 2021 per dr Richardson Landry and also documented under quick abstraction in chart.

## 2017-08-22 NOTE — Progress Notes (Signed)
   Subjective:    Patient ID: [name], female    DOB: 1941-01-22, 77 y.o.   MRN: [id number]   Hypertension   This is a chronic problem. Compliance problems include exercise (takes meds every day, eats health conscious).    Blood pressure medicine and blood pressure levels reviewed today with patient. Compliant with blood pressure medicine. States does not miss a dose. No obvious side effects. Blood pressure generally good when checked elsewhere. Watching salt intake.  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  Still dealing with.  Continues to use walker.  Not exercising as much.  Patient wonders when her next colonoscopy is due.  Research of records refill last one done 2011 recommended in 10 further y impressionears   Review of Systems No headache, no major weight loss or weight gain, no chest pain no back pain abdominal pain no change in bowel habits complete ROS otherwise negative     Objective:   Physical Exam Alert and oriented, vitals reviewed and stable, NAD ENT-TM's and ext canals WNL bilat via otoscopic exam Soft palate, tonsils and post pharynx WNL via oropharyngeal exam Neck-symmetric, no masses; thyroid nonpalpable and nontender Pulmonary-no tachypnea or accessory muscle use; Clear without wheezes via auscultation Card--no abnrml murmurs, rhythm reg and rate WNL Carotid pulses symmetric, without bruits        Assessment & Plan:  Impression prediabetes.  Discussed.  Diet discussed.  Prior blood work reviewed.  2.  Hypertension.  Now managed by cardiologist.  Patient still has questions for me questions answered will maintain same therapy  3.  Chronic foot drop.  Affecting exercise and leading to weight gain per patient.  Discussed.  4.  Preventive concerns.  Review refill  colonoscopy due 2011  Greater than 50% of this 25 minute face to face visit was spent in counseling and discussion and coordination of care regarding the above diagnosis/diagnosies Recommend wellness and chronic in 6 months

## 2017-09-13 ENCOUNTER — Ambulatory Visit: Payer: Medicare HMO | Admitting: Cardiology

## 2017-09-13 ENCOUNTER — Other Ambulatory Visit (HOSPITAL_COMMUNITY)
Admission: RE | Admit: 2017-09-13 | Discharge: 2017-09-13 | Disposition: A | Discharge status: Home / Self Care | Payer: Medicare HMO | Source: Ambulatory Visit | Attending: Cardiology | Admitting: Cardiology

## 2017-09-13 ENCOUNTER — Encounter: Payer: Self-pay | Admitting: Cardiology

## 2017-09-13 VITALS — BP 128/78 | HR 77 | Ht 63.0 in | Wt 184.0 lb

## 2017-09-13 DIAGNOSIS — I10 Essential (primary) hypertension: Secondary | ICD-10-CM | POA: Diagnosis not present

## 2017-09-13 DIAGNOSIS — R600 Localized edema: Secondary | ICD-10-CM

## 2017-09-13 LAB — MAGNESIUM: Magnesium: 1.9 mg/dL (ref 1.7–2.4)

## 2017-09-13 LAB — BASIC METABOLIC PANEL
ANION GAP: 10 (ref 5–15)
BUN: 31 mg/dL — ABNORMAL HIGH (ref 6–20)
CO2: 21 mmol/L — AB (ref 22–32)
Calcium: 9.5 mg/dL (ref 8.9–10.3)
Chloride: 104 mmol/L (ref 101–111)
Creatinine, Ser: 1.03 mg/dL — ABNORMAL HIGH (ref 0.44–1.00)
GFR calc Af Amer: 59 mL/min — ABNORMAL LOW (ref >60)
GFR, EST NON AFRICAN AMERICAN: 51 mL/min — AB (ref >60)
GLUCOSE: 130 mg/dL — AB (ref 65–99)
POTASSIUM: 3.8 mmol/L (ref 3.5–5.1)
Sodium: 135 mmol/L (ref 135–145)

## 2017-09-13 NOTE — Progress Notes (Signed)
Clinical Summary Ms. Hendel is a 77 y.o.female seen today for follow up of the following medical problems.  1.HTN  - reports diagnosed at age 54, she states has always been difficult to control  - bp log 07/2014 with borderline low bp's and heart rates, lopressor decreased to 12.5mg  bid - history of white coat HTN, often clinic bp's elevated while home numbers at goal   - home bp's 120s-140s/50-60s, heart rates 60-70s - she is compliant with meds  2.LE edema - started several years ago - echo LVEF 65-70%, grade I  - mild swelling at times. She is compliant with her chlorthalidone, limits sodium intake, wears compresoin stockings.        Past Medical History:  Diagnosis Date  . Allergic rhinitis   . Arthritis   . Asthma   . Hypertension   . IFG (impaired fasting glucose)   . Osteopenia   . Spinal stenosis      Allergies  Allergen Reactions  . Levaquin [Levofloxacin In D5w]     Sleep disturbance  . Detrol [Tolterodine]     Abdominal pain     Current Outpatient Medications  Medication Sig Dispense Refill  . albuterol (VENTOLIN HFA) 108 (90 Base) MCG/ACT inhaler INHALE TWO PUFFS BY MOUTH EVERY 6 HOURS AS NEEDED FOR WHEEZING 18 each 5  . amLODipine-benazepril (LOTREL) 10-40 MG capsule TAKE 1 CAPSULE DAILY 90 capsule 2  . aspirin EC 81 MG tablet Take 81 mg by mouth daily.    . Calcium Carbonate-Vit D-Min (CALCIUM 1200 PO) Take 1 tablet by mouth 2 (two) times daily.     . carboxymethylcellulose (REFRESH PLUS) 0.5 % SOLN Place 1 drop into both eyes 2 (two) times daily as needed. Dry eyes    . chlorthalidone (HYGROTON) 50 MG tablet TAKE 1 TABLET DAILY 90 tablet 3  . cloNIDine (CATAPRES) 0.1 MG tablet Take 1 tablet (0.1 mg total) by mouth 2 (two) times daily. 60 tablet 11  . metoprolol tartrate (LOPRESSOR) 25 MG tablet TAKE ONE-HALF (1/2) TABLET TWICE A DAY (DOSE DECREASE) 90 tablet 3  . potassium chloride SA (K-DUR,KLOR-CON) 20 MEQ tablet TAKE 1 TABLET  DAILY (NEED APPOINTMENT FOR FURTHER REFILLS) 90 tablet 2  . triamcinolone cream (KENALOG) 0.1 % Apply topically 2 (two) times daily. 15 g 5   No current facility-administered medications for this visit.      Past Surgical History:  Procedure Laterality Date  . ABDOMINAL HYSTERECTOMY    . BACK SURGERY    . CARPAL TUNNEL RELEASE    . CHOLECYSTECTOMY    . KNEE ARTHROSCOPY Left   . TOTAL ABDOMINAL HYSTERECTOMY W/ BILATERAL SALPINGOOPHORECTOMY       Allergies  Allergen Reactions  . Levaquin [Levofloxacin In D5w]     Sleep disturbance  . Detrol [Tolterodine]     Abdominal pain      Family History  Problem Relation Age of Onset  . Diabetes Mother   . Hypertension Mother   . Diabetes Father   . Hypertension Father      Social History Ms. Spradlin reports that she has never smoked. She has never used smokeless tobacco. Ms. Linderman has no alcohol history on file.   Review of Systems CONSTITUTIONAL: No weight loss, fever, chills, weakness or fatigue.  HEENT: Eyes: No visual loss, blurred vision, double vision or yellow sclerae.No hearing loss, sneezing, congestion, runny nose or sore throat.  SKIN: No rash or itching.  CARDIOVASCULAR: per hpi RESPIRATORY: No shortness of breath,  cough or sputum.  GASTROINTESTINAL: No anorexia, nausea, vomiting or diarrhea. No abdominal pain or blood.  GENITOURINARY: No burning on urination, no polyuria NEUROLOGICAL: No headache, dizziness, syncope, paralysis, ataxia, numbness or tingling in the extremities. No change in bowel or bladder control.  MUSCULOSKELETAL: No muscle, back pain, joint pain or stiffness.  LYMPHATICS: No enlarged nodes. No history of splenectomy.  PSYCHIATRIC: No history of depression or anxiety.  ENDOCRINOLOGIC: No reports of sweating, cold or heat intolerance. No polyuria or polydipsia.  Marland Kitchen   Physical Examination Vitals:   09/13/17 1043  BP: 128/78  Pulse: 77  SpO2: 97%   Vitals:   09/13/17 1043  Weight:  184 lb (83.5 kg)  Height: 5\' 3"  (1.6 m)    Gen: resting comfortably, no acute distress HEENT: no scleral icterus, pupils equal round and reactive, no palptable cervical adenopathy,  CV: RRR, no m/rg, no jvd Resp: Clear to auscultation bilaterally GI: abdomen is soft, non-tender, non-distended, normal bowel sounds, no hepatosplenomegaly MSK: extremities are warm, trace bilateral edema Skin: warm, no rash Neuro:  no focal deficits Psych: appropriate affect   Diagnostic Studies 01/2014 echo Study Conclusions  - Left ventricle: The cavity size was normal. Wall thickness was increased in a pattern of mild LVH. Systolic function was vigorous. The estimated ejection fraction was in the range of 65% to 70%. There was dynamic obstruction during Valsalva in the mid cavity caused by vigorous LV systolic function, with a peak velocity of 393 cm/sec and a peak gradient of 62 mm Hg. Doppler parameters are consistent with abnormal left ventricular relaxation (grade 1 diastolic dysfunction). - Left atrium: The atrium was mildly dilated. - Pulmonary arteries: PA peak pressure: 39 mm Hg (S). PASP is borderline elevated. - Systemic veins: The IVC is small, suggestive of low RA pressure and hypovolemia. - Technically adequate study.     Assessment and Plan   1. HTN  - history of difficult to control HTN. Has component of white coat HTN, often clinic numbers elevated and home numbers at goal - today bp well controlled in clinic and by home numbers  conitnue currnet meds  2. LE edema - overall controlled, continue current meds - repeat BMET/Mg on diuretic  EKG today shows SR, isolated PAC   F/u 6 months        Arnoldo Lenis, M.D.

## 2017-09-13 NOTE — Patient Instructions (Addendum)
Your physician wants you to follow-up in: 6 months with Dr.Branch You will receive a reminder letter in the mail two months in advance. If you don't receive a letter, please call our office to schedule the follow-up appointment.     Your physician recommends that you continue on your current medications as directed. Please refer to the Current Medication list given to you today.    If you need a refill on your cardiac medications before your next appointment, please call your pharmacy.     Get lab work today: bmet, mg+      No tests ordered today       Thank you for choosing [facility] !

## 2017-10-10 ENCOUNTER — Other Ambulatory Visit: Payer: Self-pay | Admitting: Cardiology

## 2017-10-22 ENCOUNTER — Other Ambulatory Visit: Payer: Self-pay | Admitting: Cardiology

## 2017-12-08 ENCOUNTER — Other Ambulatory Visit: Payer: Self-pay | Admitting: Cardiology

## 2018-01-06 ENCOUNTER — Other Ambulatory Visit: Payer: Self-pay | Admitting: Family Medicine

## 2018-01-06 DIAGNOSIS — Z1231 Encounter for screening mammogram for malignant neoplasm of breast: Secondary | ICD-10-CM

## 2018-01-20 ENCOUNTER — Ambulatory Visit (HOSPITAL_COMMUNITY): Payer: Medicare HMO

## 2018-01-22 ENCOUNTER — Inpatient Hospital Stay (HOSPITAL_COMMUNITY): Admission: RE | Admit: 2018-01-22 | Payer: Medicare HMO | Source: Ambulatory Visit

## 2018-01-24 ENCOUNTER — Telehealth: Payer: Self-pay | Admitting: Family Medicine

## 2018-01-24 DIAGNOSIS — E669 Obesity, unspecified: Secondary | ICD-10-CM | POA: Diagnosis not present

## 2018-01-24 DIAGNOSIS — J45909 Unspecified asthma, uncomplicated: Secondary | ICD-10-CM | POA: Diagnosis not present

## 2018-01-24 DIAGNOSIS — Z13220 Encounter for screening for lipoid disorders: Secondary | ICD-10-CM

## 2018-01-24 DIAGNOSIS — R32 Unspecified urinary incontinence: Secondary | ICD-10-CM | POA: Diagnosis not present

## 2018-01-24 DIAGNOSIS — Z7982 Long term (current) use of aspirin: Secondary | ICD-10-CM | POA: Diagnosis not present

## 2018-01-24 DIAGNOSIS — I10 Essential (primary) hypertension: Secondary | ICD-10-CM | POA: Diagnosis not present

## 2018-01-24 DIAGNOSIS — Z833 Family history of diabetes mellitus: Secondary | ICD-10-CM | POA: Diagnosis not present

## 2018-01-24 DIAGNOSIS — Z79899 Other long term (current) drug therapy: Secondary | ICD-10-CM

## 2018-01-24 DIAGNOSIS — Z6830 Body mass index (BMI) 30.0-30.9, adult: Secondary | ICD-10-CM | POA: Diagnosis not present

## 2018-01-24 DIAGNOSIS — Z8249 Family history of ischemic heart disease and other diseases of the circulatory system: Secondary | ICD-10-CM | POA: Diagnosis not present

## 2018-01-24 NOTE — Telephone Encounter (Signed)
Labs ordered. Tried calling no answer.

## 2018-01-24 NOTE — Telephone Encounter (Signed)
Pt has physical scheduled OCT 14. She would like lab work ordered so she can get it done before her appt.

## 2018-01-24 NOTE — Telephone Encounter (Signed)
liv lip met cbc

## 2018-01-24 NOTE — Telephone Encounter (Signed)
Patient last had b/w done 09/13/2017 Mag,Bmet

## 2018-01-30 NOTE — Telephone Encounter (Signed)
Patient notified and verbalized understanding. 

## 2018-02-03 ENCOUNTER — Ambulatory Visit (HOSPITAL_COMMUNITY): Payer: Medicare HMO

## 2018-02-06 ENCOUNTER — Ambulatory Visit (HOSPITAL_COMMUNITY)
Admission: RE | Admit: 2018-02-06 | Discharge: 2018-02-06 | Disposition: A | Discharge status: Home / Self Care | Payer: Medicare HMO | Source: Ambulatory Visit | Attending: Family Medicine | Admitting: Family Medicine

## 2018-02-06 ENCOUNTER — Encounter (HOSPITAL_COMMUNITY): Payer: Self-pay

## 2018-02-06 DIAGNOSIS — Z1231 Encounter for screening mammogram for malignant neoplasm of breast: Secondary | ICD-10-CM | POA: Insufficient documentation

## 2018-02-10 DIAGNOSIS — Z13220 Encounter for screening for lipoid disorders: Secondary | ICD-10-CM | POA: Diagnosis not present

## 2018-02-10 DIAGNOSIS — I10 Essential (primary) hypertension: Secondary | ICD-10-CM | POA: Diagnosis not present

## 2018-02-10 DIAGNOSIS — Z79899 Other long term (current) drug therapy: Secondary | ICD-10-CM | POA: Diagnosis not present

## 2018-02-11 LAB — CBC WITH DIFFERENTIAL/PLATELET
BASOS ABS: 0 10*3/uL (ref 0.0–0.2)
BASOS: 0 %
EOS (ABSOLUTE): 0.2 10*3/uL (ref 0.0–0.4)
Eos: 3 %
HEMOGLOBIN: 12.4 g/dL (ref 11.1–15.9)
Hematocrit: 37.6 % (ref 34.0–46.6)
Immature Grans (Abs): 0 10*3/uL (ref 0.0–0.1)
Immature Granulocytes: 0 %
LYMPHS ABS: 3.4 10*3/uL — AB (ref 0.7–3.1)
Lymphs: 52 %
MCH: 29 pg (ref 26.6–33.0)
MCHC: 33 g/dL (ref 31.5–35.7)
MCV: 88 fL (ref 79–97)
MONOCYTES: 9 %
Monocytes Absolute: 0.6 10*3/uL (ref 0.1–0.9)
Neutrophils Absolute: 2.4 10*3/uL (ref 1.4–7.0)
Neutrophils: 36 %
Platelets: 286 10*3/uL (ref 150–450)
RBC: 4.28 x10E6/uL (ref 3.77–5.28)
RDW: 13.6 % (ref 12.3–15.4)
WBC: 6.5 10*3/uL (ref 3.4–10.8)

## 2018-02-11 LAB — BASIC METABOLIC PANEL
BUN/Creatinine Ratio: 18 (ref 12–28)
BUN: 25 mg/dL (ref 8–27)
CHLORIDE: 104 mmol/L (ref 96–106)
CO2: 23 mmol/L (ref 20–29)
Calcium: 10.3 mg/dL (ref 8.7–10.3)
Creatinine, Ser: 1.41 mg/dL — ABNORMAL HIGH (ref 0.57–1.00)
GFR calc Af Amer: 41 mL/min/{1.73_m2} — ABNORMAL LOW (ref >59)
GFR calc non Af Amer: 36 mL/min/{1.73_m2} — ABNORMAL LOW (ref >59)
Glucose: 103 mg/dL — ABNORMAL HIGH (ref 65–99)
Potassium: 5.1 mmol/L (ref 3.5–5.2)
Sodium: 140 mmol/L (ref 134–144)

## 2018-02-11 LAB — LIPID PANEL
CHOL/HDL RATIO: 3.8 ratio (ref 0.0–4.4)
Cholesterol, Total: 156 mg/dL (ref 100–199)
HDL: 41 mg/dL (ref >39)
LDL Calculated: 87 mg/dL (ref 0–99)
Triglycerides: 138 mg/dL (ref 0–149)
VLDL CHOLESTEROL CAL: 28 mg/dL (ref 5–40)

## 2018-02-11 LAB — HEPATIC FUNCTION PANEL
ALT: 9 IU/L (ref 0–32)
AST: 16 IU/L (ref 0–40)
Albumin: 4.1 g/dL (ref 3.5–4.8)
Alkaline Phosphatase: 71 IU/L (ref 39–117)
BILIRUBIN TOTAL: 0.6 mg/dL (ref 0.0–1.2)
BILIRUBIN, DIRECT: 0.13 mg/dL (ref 0.00–0.40)
Total Protein: 6.9 g/dL (ref 6.0–8.5)

## 2018-02-19 ENCOUNTER — Encounter: Payer: Self-pay | Admitting: Cardiology

## 2018-02-19 ENCOUNTER — Ambulatory Visit: Payer: Medicare HMO | Admitting: Cardiology

## 2018-02-19 VITALS — BP 156/74 | HR 84 | Ht 63.0 in | Wt 183.0 lb

## 2018-02-19 DIAGNOSIS — R600 Localized edema: Secondary | ICD-10-CM

## 2018-02-19 DIAGNOSIS — Z23 Encounter for immunization: Secondary | ICD-10-CM

## 2018-02-19 DIAGNOSIS — I10 Essential (primary) hypertension: Secondary | ICD-10-CM

## 2018-02-19 DIAGNOSIS — N179 Acute kidney failure, unspecified: Secondary | ICD-10-CM

## 2018-02-19 NOTE — Progress Notes (Signed)
Clinical Summary [name] is a 77 y.o.female seen today for follow up of the following medical problems.  1.HTN  - reports diagnosed at age 18, she states has always been difficult to control  - bp log 07/2014 with borderline low bp's and heart rates, lopressor decreased to 12.5mg  bid - history of white coat HTN, often clinic bp's elevated while home numbers at goal   - home bp's 110s/50-60s.  - taking chlorathaline 25mg  daily as opposed to 50mg  daily that is written on her chart.   2.LE edema - started several years ago - echo LVEF 65-70%, grade I   - overall controlled, does well with compression stockings.    3. Dynamic LVOT gradient - on beta blocker. No recent symptoms.   4. AKI  - Cr up to 1.4, baselien is 1.  - reports limited oral hydration. No significant NSAId use    SH: son recently passed due to complications of diabetes  Past Medical History:  Diagnosis Date  . Allergic rhinitis   . Arthritis   . Asthma   . Hypertension   . IFG (impaired fasting glucose)   . Osteopenia   . Spinal stenosis      Allergies  Allergen Reactions  . Levaquin [Levofloxacin In D5w]     Sleep disturbance  . Detrol [Tolterodine]     Abdominal pain     Current Outpatient Medications  Medication Sig Dispense Refill  . albuterol (VENTOLIN HFA) 108 (90 Base) MCG/ACT inhaler INHALE TWO PUFFS BY MOUTH EVERY 6 HOURS AS NEEDED FOR WHEEZING 18 each 5  . amLODipine-benazepril (LOTREL) 10-40 MG capsule TAKE 1 CAPSULE DAILY 90 capsule 2  . aspirin EC 81 MG tablet Take 81 mg by mouth daily.    . Calcium Carbonate-Vit D-Min (CALCIUM 1200 PO) Take 1 tablet by mouth 2 (two) times daily.     . carboxymethylcellulose (REFRESH PLUS) 0.5 % SOLN Place 1 drop into both eyes 2 (two) times daily as needed. Dry eyes    . chlorthalidone (HYGROTON) 50 MG tablet TAKE 1 TABLET DAILY 90 tablet 3  . cloNIDine (CATAPRES) 0.1 MG tablet Take 0.1 mg by mouth 2 (two) times daily. Takes  1/2 TABLET, TWICE A DAY    . cloNIDine (CATAPRES) 0.2 MG tablet TAKE 1 TABLET TWICE A DAY 180 tablet 3  . metoprolol tartrate (LOPRESSOR) 25 MG tablet TAKE ONE-HALF (1/2) TABLET TWICE A DAY (DOSE DECREASE) 90 tablet 3  . potassium chloride SA (K-DUR,KLOR-CON) 20 MEQ tablet TAKE 1 TABLET DAILY (NEED APPOINTMENT FOR FURTHER REFILLS) 90 tablet 2  . triamcinolone cream (KENALOG) 0.1 % Apply topically 2 (two) times daily. 15 g 5   No current facility-administered medications for this visit.      Past Surgical History:  Procedure Laterality Date  . ABDOMINAL HYSTERECTOMY    . BACK SURGERY    . CARPAL TUNNEL RELEASE    . CHOLECYSTECTOMY    . KNEE ARTHROSCOPY Left   . TOTAL ABDOMINAL HYSTERECTOMY W/ BILATERAL SALPINGOOPHORECTOMY       Allergies  Allergen Reactions  . Levaquin [Levofloxacin In D5w]     Sleep disturbance  . Detrol [Tolterodine]     Abdominal pain      Family History  Problem Relation Age of Onset  . Diabetes Mother   . Hypertension Mother   . Diabetes Father   . Hypertension Father      Social History [name] reports that she has never smoked. She has never  used smokeless tobacco. [name] has no alcohol history on file.   Review of Systems CONSTITUTIONAL: No weight loss, fever, chills, weakness or fatigue.  HEENT: Eyes: No visual loss, blurred vision, double vision or yellow sclerae.No hearing loss, sneezing, congestion, runny nose or sore throat.  SKIN: No rash or itching.  CARDIOVASCULAR: no chest pain or palpitations RESPIRATORY: No shortness of breath, cough or sputum.  GASTROINTESTINAL: No anorexia, nausea, vomiting or diarrhea. No abdominal pain or blood.  GENITOURINARY: No burning on urination, no polyuria NEUROLOGICAL: No headache, dizziness, syncope, paralysis, ataxia, numbness or tingling in the extremities. No change in bowel or bladder control.  MUSCULOSKELETAL: No muscle, back pain, joint pain or stiffness.  LYMPHATICS: No enlarged  nodes. No history of splenectomy.  PSYCHIATRIC: No history of depression or anxiety.  ENDOCRINOLOGIC: No reports of sweating, cold or heat intolerance. No polyuria or polydipsia.  Marland Kitchen   Physical Examination Vitals:   02/19/18 1311  BP: (!) 156/74  Pulse: 84  SpO2: 99%   Vitals:   02/19/18 1311  Weight: 183 lb (83 kg)  Height: 5\' 3"  (1.6 m)    Gen: resting comfortably, no acute distress HEENT: no scleral icterus, pupils equal round and reactive, no palptable cervical adenopathy,  CV: RRR, 2/6 systolic murmur rusb, no jvd Resp: Clear to auscultation bilaterally GI: abdomen is soft, non-tender, non-distended, normal bowel sounds, no hepatosplenomegaly MSK: extremities are warm, no edema.  Skin: warm, no rash Neuro:  no focal deficits Psych: appropriate affect   Diagnostic Studies 01/2014 echo Study Conclusions  - Left ventricle: The cavity size was normal. Wall thickness was increased in a pattern of mild LVH. Systolic function was vigorous. The estimated ejection fraction was in the range of 65% to 70%. There was dynamic obstruction during Valsalva in the mid cavity caused by vigorous LV systolic function, with a peak velocity of 393 cm/sec and a peak gradient of 62 mm Hg. Doppler parameters are consistent with abnormal left ventricular relaxation (grade 1 diastolic dysfunction). - Left atrium: The atrium was mildly dilated. - Pulmonary arteries: PA peak pressure: 39 mm Hg (S). PASP is borderline elevated. - Systemic veins: The IVC is small, suggestive of low RA pressure and hypovolemia. - Technically adequate study.    Assessment and Plan  1. HTN  - longtime history of white coat HTN, clinic bp's elevated with normal bp's at home. Same pattern today - with her AKI, lower chlorthalidone to 12.5mg  daily.   2. LE edema - contolled, continue compression stockings. Lowering diuretic due to AKI  3. AKI - Cr up from 1 to 1.4 on last checko -  lower chlorthalidone dosing, increase oral hydration. Repeat labs 2 weeks   F/u 6 months      Arnoldo Lenis, M.D.

## 2018-02-19 NOTE — Patient Instructions (Signed)
Medication Instructions:  DECREASE CHLORTHALIDONE TO 12.5 MG DAILY   If you need a refill on your cardiac medications before your next appointment, please call your pharmacy.   Lab work: 2 Scientist, forensic  If you have labs (blood work) drawn today and your tests are completely normal, you will receive your results only by: Marland Kitchen MyChart Message (if you have MyChart) OR . A paper copy in the mail If you have any lab test that is abnormal or we need to change your treatment, we will call you to review the results.  Testing/Procedures: NONE  Follow-Up: At [facility], you and your health needs are our priority.  As part of our continuing mission to provide you with exceptional heart care, we have created designated Provider Care Teams.  These Care Teams include your primary Cardiologist (physician) and Advanced Practice Providers (APPs -  Physician Assistants and Nurse Practitioners) who all work together to provide you with the care you need, when you need it. You will need a follow up appointment in 6 months.  Please call our office 2 months in advance to schedule this appointment.  You may see Carlyle Dolly, MD   or one of the following Advanced Practice Providers on your designated Care Team:   Bernerd Pho, PA-C [facility]) . Ermalinda Barrios, PA-C ([place])  Any Other Special Instructions Will Be Listed Below (If Applicable). YOU HAVE BEEN GIVEN A FLU SHOT TODAY

## 2018-02-21 ENCOUNTER — Encounter: Payer: Medicare HMO | Admitting: Family Medicine

## 2018-02-21 ENCOUNTER — Ambulatory Visit: Payer: Medicare HMO | Admitting: Family Medicine

## 2018-02-24 ENCOUNTER — Ambulatory Visit (INDEPENDENT_AMBULATORY_CARE_PROVIDER_SITE_OTHER): Payer: Medicare HMO | Admitting: Family Medicine

## 2018-02-24 ENCOUNTER — Encounter: Payer: Self-pay | Admitting: Family Medicine

## 2018-02-24 VITALS — BP 154/82 | Ht 63.0 in | Wt 182.2 lb

## 2018-02-24 DIAGNOSIS — N3941 Urge incontinence: Secondary | ICD-10-CM | POA: Diagnosis not present

## 2018-02-24 DIAGNOSIS — Z0000 Encounter for general adult medical examination without abnormal findings: Secondary | ICD-10-CM

## 2018-02-24 DIAGNOSIS — R7301 Impaired fasting glucose: Secondary | ICD-10-CM

## 2018-02-24 DIAGNOSIS — L603 Nail dystrophy: Secondary | ICD-10-CM | POA: Diagnosis not present

## 2018-02-24 DIAGNOSIS — M21379 Foot drop, unspecified foot: Secondary | ICD-10-CM

## 2018-02-24 DIAGNOSIS — I10 Essential (primary) hypertension: Secondary | ICD-10-CM

## 2018-02-24 DIAGNOSIS — F4321 Adjustment disorder with depressed mood: Secondary | ICD-10-CM

## 2018-02-24 DIAGNOSIS — R69 Illness, unspecified: Secondary | ICD-10-CM | POA: Diagnosis not present

## 2018-02-24 MED ORDER — MIRABEGRON ER 25 MG PO TB24: 25.0000 mg | ORAL_TABLET | Freq: Every day | ORAL | 11 refills | Status: DC

## 2018-02-24 NOTE — Progress Notes (Addendum)
Subjective:    Patient ID: [name], female    DOB: Apr 04, 1941, 77 y.o.   MRN: [id number]  HPI AWV- Annual Wellness Visit  The patient was seen for their annual wellness visit. The patient's past medical history, surgical history, and family history were reviewed. Pertinent vaccines were reviewed ( tetanus, pneumonia, shingles, flu) The patient's medication list was reviewed and updated.  The height and weight were entered.  BMI recorded in electronic record elsewhere  Cognitive screening was completed. Outcome of Mini - Cog:   Falls /depression screening electronically recorded within record elsewhere  Current tobacco usage:none (All patients who use tobacco were given written and verbal information on quitting)  Recent listing of emergency department/hospitalizations over the past year were reviewed.  current specialist the patient sees on a regular basis: Dr.Branch   Medicare annual wellness visit patient questionnaire was reviewed.  A written screening schedule for the patient for the next 5-10 years was given. Appropriate discussion of followup regarding next visit was discussed.   Pt would like to discuss bladder control, Detrol caused abdominal symptoms.  Patient would like to try something else.  She has a definite element of urge incontinence but   living well and podiatrist referral  Pt has thickened aggrvating nail, would like to see a podiatrist for it.   Hurts when wears showes for awhil 'e, causing her discomfort.  Same foot which is involved with her chronic foot drop    Gets mammo reg    . TDap  htn now on new meds    Review of Systems  Constitutional: Negative for activity change, appetite change and fatigue.  HENT: Negative for congestion and rhinorrhea.   Eyes: Negative for discharge.  Respiratory: Negative for cough, chest tightness and wheezing.   Cardiovascular: Negative for chest pain.  Gastrointestinal: Negative for  abdominal pain, blood in stool and vomiting.  Endocrine: Negative for polyphagia.  Genitourinary: Negative for difficulty urinating and frequency.  Musculoskeletal: Negative for neck pain.  Skin: Negative for color change.  Allergic/Immunologic: Negative for environmental allergies and food allergies.  Neurological: Negative for weakness and headaches.  Psychiatric/Behavioral: Negative for agitation and behavioral problems.  All other systems reviewed and are negative.      Objective:   Physical Exam  Constitutional: She is oriented to person, place, and time. She appears well-developed and well-nourished.  HENT:  Head: Normocephalic.  Right Ear: External ear normal.  Left Ear: External ear normal.  Eyes: Pupils are equal, round, and reactive to light.  Neck: Normal range of motion. No thyromegaly present.  Cardiovascular: Normal rate, regular rhythm, normal heart sounds and intact distal pulses.  No murmur heard. Pulmonary/Chest: Effort normal and breath sounds normal. No respiratory distress. She has no wheezes.  Abdominal: Soft. Bowel sounds are normal. She exhibits no distension and no mass. There is no tenderness.  Musculoskeletal: Normal range of motion. She exhibits no edema or tenderness.  Lymphadenopathy:    She has no cervical adenopathy.  Neurological: She is alert and oriented to person, place, and time. She exhibits normal muscle tone.  Skin: Skin is warm and dry.  Psychiatric: She has a normal mood and affect. Her behavior is normal.  Vitals reviewed.         Assessment & Plan:  Impression wellness exam.  Diet discussed exercise discussed.  Vaccines discussed net colon due sept 2011  2.  Hypertension.  Multiple questions discussed with patient  3.  Chronic renal insufficiency.  Definitely worse  this year.  Patient's cardiologist backed off the diuretic which I think is very appropriate  4.  Urge incontinence.  Patient would like to try different agent.  We  will see with mybetriq  #5 chronic nail dystrophy.  Discussed.  Podiatry referral  6 Grief, recent loss of son secondary to diabetes discussed

## 2018-02-24 NOTE — Patient Instructions (Signed)
Advance Directive Advance directives are legal documents that let you make choices ahead of time about your health care and medical treatment in case you become unable to communicate for yourself. Advance directives are a way for you to communicate your wishes to family, friends, and health care providers. This can help convey your decisions about end-of-life care if you become unable to communicate. Discussing and writing advance directives should happen over time rather than all at once. Advance directives can be changed depending on your situation and what you want, even after you have signed the advance directives. If you do not have an advance directive, some states assign family decision makers to act on your behalf based on how closely you are related to them. Each state has its own laws regarding advance directives. You may want to check with your health care provider, attorney, or state representative about the laws in your state. There are different types of advance directives, such as:  Medical power of attorney.  Living will.  Do not resuscitate (DNR) or do not attempt resuscitation (DNAR) order. Health care proxy and medical power of attorney A health care proxy, also called a health care agent, is a person who is appointed to make medical decisions for you in cases in which you are unable to make the decisions yourself. Generally, people choose someone they know well and trust to represent their preferences. Make sure to ask this person for an agreement to act as your proxy. A proxy may have to exercise judgment in the event of a medical decision for which your wishes are not known. A medical power of attorney is a legal document that names your health care proxy. Depending on the laws in your state, after the document is written, it may also need to be:  Signed.  Notarized.  Dated.  Copied.  Witnessed.  Incorporated into your medical record. You may also want to appoint  someone to manage your financial affairs in a situation in which you are unable to do so. This is called a durable power of attorney for finances. It is a separate legal document from the durable power of attorney for health care. You may choose the same person or someone different from your health care proxy to act as your agent in financial matters. If you do not appoint a proxy, or if there is a concern that the proxy is not acting in your best interests, a court-appointed guardian may be designated to act on your behalf. Living will A living will is a set of instructions documenting your wishes about medical care when you cannot express them yourself. Health care providers should keep a copy of your living will in your medical record. You may want to give a copy to family members or friends. To alert caregivers in case of an emergency, you can place a card in your wallet to let them know that you have a living will and where they can find it. A living will is used if you become:  Terminally ill.  Incapacitated.  Unable to communicate or make decisions. Items to consider in your living will include:  The use or non-use of life-sustaining equipment, such as dialysis machines and breathing machines (ventilators).  A DNR or DNAR order, which is the instruction not to use cardiopulmonary resuscitation (CPR) if breathing or heartbeat stops.  The use or non-use of tube feeding.  Withholding of food and fluids.  Comfort (palliative) care when the goal becomes comfort rather than   a cure.  Organ and tissue donation. A living will does not give instructions for distributing your money and property if you should pass away. It is recommended that you seek the advice of a lawyer when writing a will. Decisions about taxes, beneficiaries, and asset distribution will be legally binding. This process can relieve your family and friends of any concerns surrounding disputes or questions that may come up about  the distribution of your assets. DNR or DNAR A DNR or DNAR order is a request not to have CPR in the event that your heart stops beating or you stop breathing. If a DNR or DNAR order has not been made and shared, a health care provider will try to help any patient whose heart has stopped or who has stopped breathing. If you plan to have surgery, talk with your health care provider about how your DNR or DNAR order will be followed if problems occur. Summary  Advance directives are the legal documents that allow you to make choices ahead of time about your health care and medical treatment in case you become unable to communicate for yourself.  The process of discussing and writing advance directives should happen over time. You can change the advance directives, even after you have signed them.  Advance directives include DNR or DNAR orders, living wills, and designating an agent as your medical power of attorney. This information is not intended to replace advice given to you by your health care provider. Make sure you discuss any questions you have with your health care provider. Document Released: 08/07/2007 Document Revised: 03/19/2016 Document Reviewed: 03/19/2016 Elsevier Interactive Patient Education  2017 [employer]  

## 2018-03-04 ENCOUNTER — Encounter: Payer: Self-pay | Admitting: Family Medicine

## 2018-03-04 ENCOUNTER — Other Ambulatory Visit: Payer: Self-pay | Admitting: Cardiology

## 2018-03-04 DIAGNOSIS — I10 Essential (primary) hypertension: Secondary | ICD-10-CM | POA: Diagnosis not present

## 2018-03-05 ENCOUNTER — Telehealth: Payer: Self-pay | Admitting: Cardiology

## 2018-03-05 LAB — BASIC METABOLIC PANEL
BUN / CREAT RATIO: 24 (ref 12–28)
BUN: 23 mg/dL (ref 8–27)
CO2: 21 mmol/L (ref 20–29)
CREATININE: 0.96 mg/dL (ref 0.57–1.00)
Calcium: 9.8 mg/dL (ref 8.7–10.3)
Chloride: 105 mmol/L (ref 96–106)
GFR, EST AFRICAN AMERICAN: 66 mL/min/{1.73_m2} (ref >59)
GFR, EST NON AFRICAN AMERICAN: 57 mL/min/{1.73_m2} — AB (ref >59)
GLUCOSE: 90 mg/dL (ref 65–99)
Potassium: 4 mmol/L (ref 3.5–5.2)
SODIUM: 141 mmol/L (ref 134–144)

## 2018-03-05 LAB — SPECIMEN STATUS REPORT

## 2018-03-05 MED ORDER — CHLORTHALIDONE 25 MG PO TABS: 12.5000 mg | ORAL_TABLET | Freq: Every day | ORAL | 3 refills | Status: DC

## 2018-03-05 NOTE — Telephone Encounter (Signed)
Please give pt a call concerning her Chlorthalidone

## 2018-03-05 NOTE — Telephone Encounter (Signed)
90 Day supply of Chlorthalidone 12.5 mg daily sent to [facility]. 14 Day supply called into [facility] [place].

## 2018-03-12 ENCOUNTER — Other Ambulatory Visit: Payer: Self-pay | Admitting: Family Medicine

## 2018-03-17 ENCOUNTER — Ambulatory Visit: Payer: Medicare HMO | Admitting: Podiatry

## 2018-03-17 DIAGNOSIS — B351 Tinea unguium: Secondary | ICD-10-CM

## 2018-03-17 DIAGNOSIS — M79674 Pain in right toe(s): Secondary | ICD-10-CM

## 2018-03-17 DIAGNOSIS — M79675 Pain in left toe(s): Secondary | ICD-10-CM | POA: Diagnosis not present

## 2018-03-19 NOTE — Progress Notes (Signed)
Subjective:   Patient ID: [name], female   DOB: 77 y.o.   MRN: [id number]   HPI 77 year old female presents the office with concerns of her toenails becoming thicker and more discolored and turning yellow.  She said they do cause pain with pressure in shoes at times she denies any redness or drainage or any swelling.  She has no other concerns today she said no recent treatment.   Review of Systems  All other systems reviewed and are negative.  Past Medical History:  Diagnosis Date  . Allergic rhinitis   . Arthritis   . Asthma   . Hypertension   . IFG (impaired fasting glucose)   . Osteopenia   . Spinal stenosis     Past Surgical History:  Procedure Laterality Date  . ABDOMINAL HYSTERECTOMY    . BACK SURGERY    . CARPAL TUNNEL RELEASE    . CHOLECYSTECTOMY    . KNEE ARTHROSCOPY Left   . TOTAL ABDOMINAL HYSTERECTOMY W/ BILATERAL SALPINGOOPHORECTOMY       Current Outpatient Medications:  .  albuterol (PROVENTIL HFA;VENTOLIN HFA) 108 (90 Base) MCG/ACT inhaler, , Disp: , Rfl:  .  doxazosin (CARDURA) 8 MG tablet, , Disp: , Rfl:  .  furosemide (LASIX) 40 MG tablet, , Disp: , Rfl:  .  NON FORMULARY, [facility]  Onychomycosis Nail Lacquer -  Fluconazole 2%, Terbinafine 1% DMSO Apply to affected nail once daily Qty. 120 gm 3 refills, Disp: , Rfl:  .  traMADol (ULTRAM) 50 MG tablet, , Disp: , Rfl:  .  albuterol (VENTOLIN HFA) 108 (90 Base) MCG/ACT inhaler, INHALE TWO PUFFS BY MOUTH EVERY 6 HOURS AS NEEDED FOR WHEEZING, Disp: 18 each, Rfl: 5 .  amLODipine-benazepril (LOTREL) 10-40 MG capsule, TAKE 1 CAPSULE DAILY, Disp: 90 capsule, Rfl: 2 .  aspirin EC 81 MG tablet, Take 81 mg by mouth daily., Disp: , Rfl:  .  Calcium Carbonate-Vit D-Min (CALCIUM 1200 PO), Take 1 tablet by mouth 2 (two) times daily. , Disp: , Rfl:  .  Calcium Carbonate-Vitamin D (OYSTER SHELL CALCIUM 500 + D) 500-125 MG-UNIT TABS, Take by mouth., Disp: , Rfl:  .  carboxymethylcellulose (REFRESH PLUS)  0.5 % SOLN, Place 1 drop into both eyes 2 (two) times daily as needed. Dry eyes, Disp: , Rfl:  .  chlorthalidone (HYGROTON) 25 MG tablet, Take 0.5 tablets (12.5 mg total) by mouth daily., Disp: 45 tablet, Rfl: 3 .  cloNIDine (CATAPRES) 0.1 MG tablet, Take 0.1 mg by mouth 2 (two) times daily. Takes 1/2 TABLET, TWICE A DAY, Disp: , Rfl:  .  cloNIDine (CATAPRES) 0.2 MG tablet, TAKE 1 TABLET TWICE A DAY, Disp: 180 tablet, Rfl: 3 .  metoprolol tartrate (LOPRESSOR) 25 MG tablet, TAKE ONE-HALF (1/2) TABLET TWICE A DAY (DOSE DECREASE), Disp: 90 tablet, Rfl: 3 .  mirabegron ER (MYRBETRIQ) 25 MG TB24 tablet, Take 1 tablet (25 mg total) by mouth daily., Disp: 30 tablet, Rfl: 11 .  potassium chloride SA (K-DUR,KLOR-CON) 20 MEQ tablet, TAKE 1 TABLET DAILY (NEED APPOINTMENT FOR FURTHER REFILLS), Disp: 90 tablet, Rfl: 2 .  triamcinolone cream (KENALOG) 0.1 %, APPLY  CREAM EXTERNALLY TO AFFECTED AREA TWICE DAILY, Disp: 15 g, Rfl: 5  Allergies  Allergen Reactions  . Levaquin [Levofloxacin In D5w]     Sleep disturbance  . Detrol [Tolterodine]     Abdominal pain         Objective:  Physical Exam  General: AAO x3, NAD  Dermatological: Nails are  hypertrophic, dystrophic, brittle, yellow/brown discoloration, elongated 10. No surrounding redness or drainage. Tenderness nails 1-5 bilaterally. No open lesions or pre-ulcerative lesions are identified today.  Vascular: Dorsalis Pedis artery and Posterior Tibial artery pedal pulses are 2/4 bilateral with immedate capillary fill time. There is no pain with calf compression, swelling, warmth, erythema.   Neruologic: Grossly intact via light touch bilateral. Protective threshold with Semmes Wienstein monofilament intact to all pedal sites bilateral.   Musculoskeletal: No gross boney pedal deformities bilateral. No pain, crepitus, or limitation noted with foot and ankle range of motion bilateral. Muscular strength 5/5 in all groups tested bilateral.      Assessment:   Symptomatic onychomycosis     Plan:  -Treatment options discussed including all alternatives, risks, and complications -Etiology of symptoms were discussed -Nails debrided 10 without complications or bleeding.  Discussed treatment options for nail fungus and she wants to do topical treatment.  I prescribed a compound cream today through [facility].  -Daily foot inspection -Follow-up in 3 months or sooner if any problems arise. In the meantime, encouraged to call the office with any questions, concerns, change in symptoms.   Celesta Gentile, DPM

## 2018-05-16 ENCOUNTER — Other Ambulatory Visit: Payer: Self-pay | Admitting: Cardiology

## 2018-05-21 IMAGING — MG 2D DIGITAL SCREENING BILATERAL MAMMOGRAM WITH CAD AND ADJUNCT TO
4 series · 4 of 16 positions shown · non-contrast
Comparison: Previous exam(s).

CLINICAL DATA: Screening.

EXAM:
2D DIGITAL SCREENING BILATERAL MAMMOGRAM WITH CAD AND ADJUNCT TOMO

[R CC]
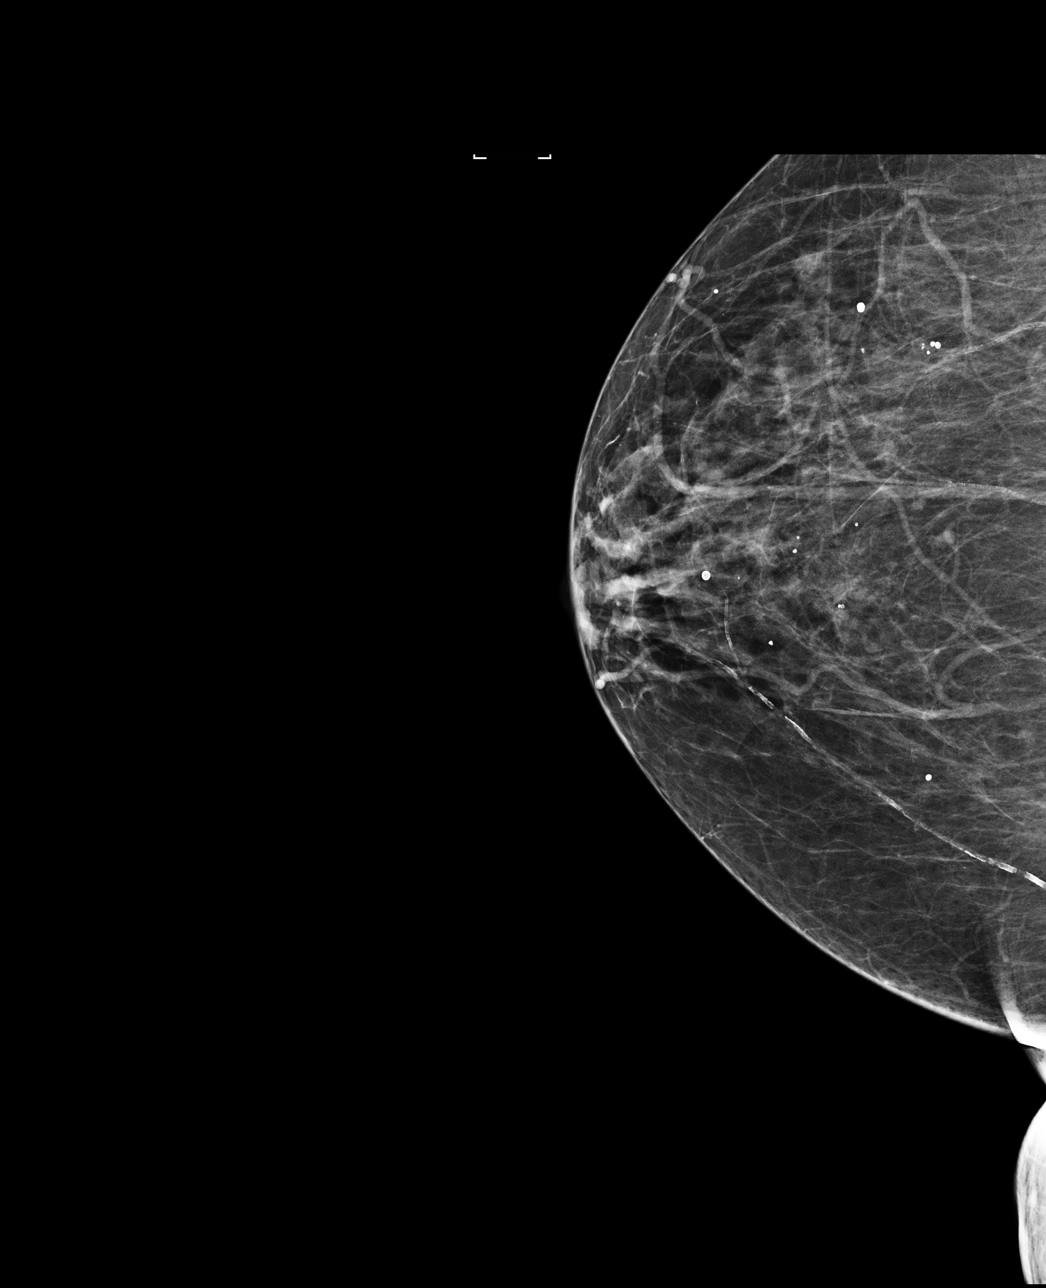

[L CC tomo · tomo slice 29/57.0]
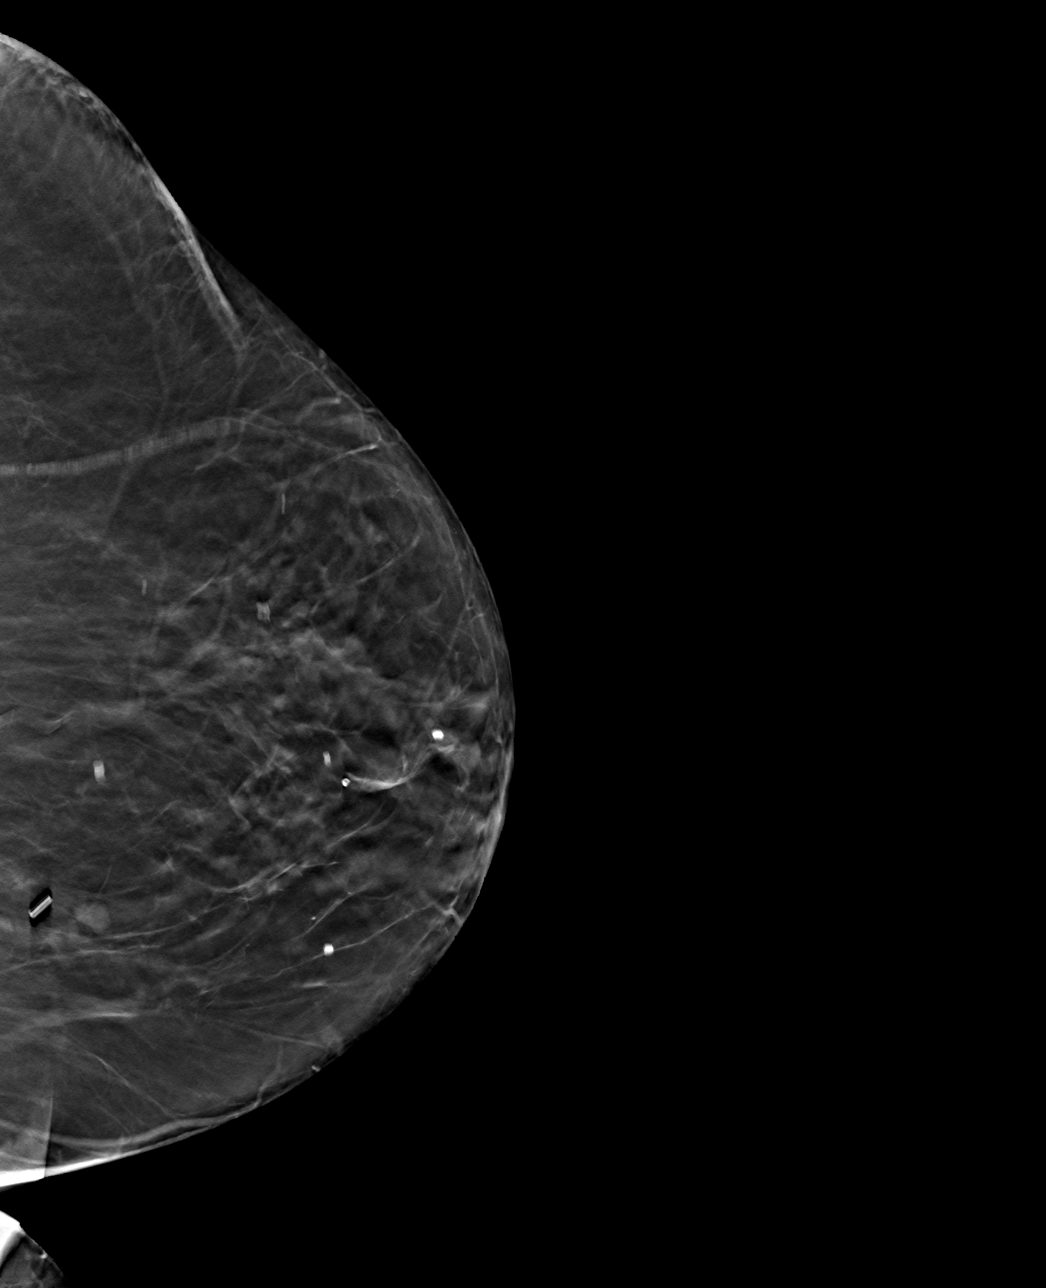

[R CC tomo · tomo slice 27/54.0]
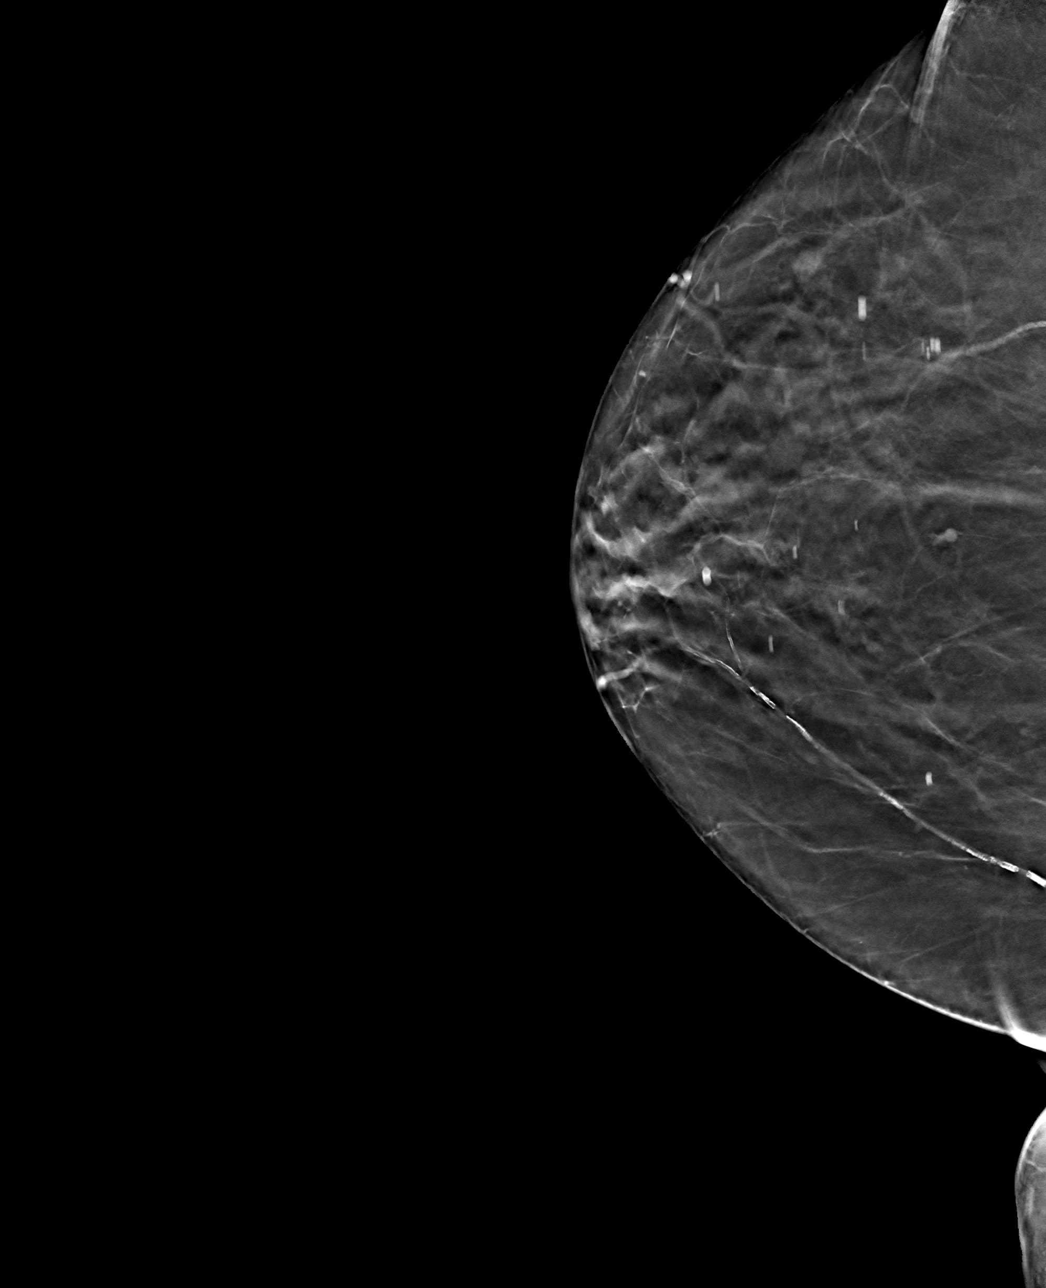

[R MLO tomo · tomo slice 33/64.0]
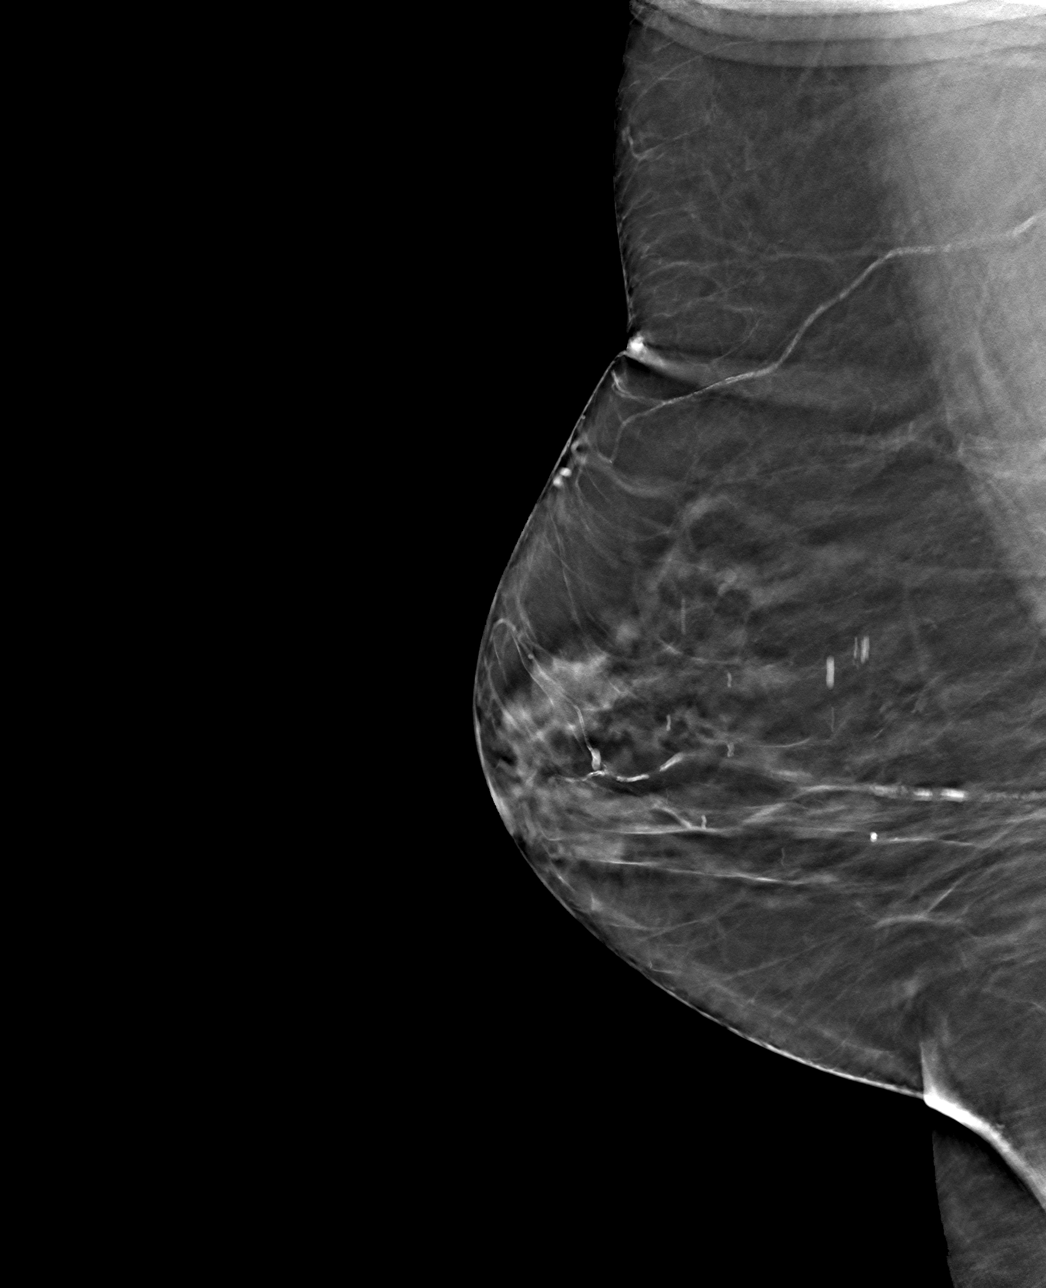

[4 of 16 positions shown; findings below may reference images not displayed]

ACR Breast Density Category b: There are scattered areas of
fibroglandular density.
FINDINGS: There are no findings suspicious for malignancy. Images were
processed with CAD.
IMPRESSION: No mammographic evidence of malignancy. A result letter of this
screening mammogram will be mailed directly to the patient.

RECOMMENDATION:
Screening mammogram in one year. (Code:97-6-RS4)

BI-RADS CATEGORY  1: Negative.

## 2018-06-16 ENCOUNTER — Ambulatory Visit: Payer: Medicare HMO | Admitting: Podiatry

## 2018-06-23 ENCOUNTER — Encounter: Payer: Self-pay | Admitting: Podiatry

## 2018-06-23 ENCOUNTER — Ambulatory Visit: Payer: Medicare HMO | Admitting: Podiatry

## 2018-06-23 DIAGNOSIS — M79675 Pain in left toe(s): Secondary | ICD-10-CM

## 2018-06-23 DIAGNOSIS — B351 Tinea unguium: Secondary | ICD-10-CM | POA: Diagnosis not present

## 2018-06-23 DIAGNOSIS — M79674 Pain in right toe(s): Secondary | ICD-10-CM

## 2018-06-23 NOTE — Progress Notes (Signed)
Subjective: [name] presents today with painful, thick toenails 1-5 b/l that she cannot cut and which interfere with daily activities.  Pain is aggravated when wearing enclosed shoe gear.  She states she continues to use the topical compounded antifungal medication prescribed.   Mikey Kirschner, MD is her PCP and last visit was 02/24/2018.   Current Outpatient Medications:  .  albuterol (PROVENTIL HFA;VENTOLIN HFA) 108 (90 Base) MCG/ACT inhaler, , Disp: , Rfl:  .  albuterol (VENTOLIN HFA) 108 (90 Base) MCG/ACT inhaler, INHALE TWO PUFFS BY MOUTH EVERY 6 HOURS AS NEEDED FOR WHEEZING, Disp: 18 each, Rfl: 5 .  amLODipine-benazepril (LOTREL) 10-40 MG capsule, TAKE 1 CAPSULE DAILY, Disp: 90 capsule, Rfl: 2 .  aspirin EC 81 MG tablet, Take 81 mg by mouth daily., Disp: , Rfl:  .  Calcium Carbonate-Vit D-Min (CALCIUM 1200 PO), Take 1 tablet by mouth 2 (two) times daily. , Disp: , Rfl:  .  Calcium Carbonate-Vitamin D (OYSTER SHELL CALCIUM 500 + D) 500-125 MG-UNIT TABS, Take by mouth., Disp: , Rfl:  .  carboxymethylcellulose (REFRESH PLUS) 0.5 % SOLN, Place 1 drop into both eyes 2 (two) times daily as needed. Dry eyes, Disp: , Rfl:  .  chlorthalidone (HYGROTON) 25 MG tablet, Take 0.5 tablets (12.5 mg total) by mouth daily., Disp: 45 tablet, Rfl: 3 .  cloNIDine (CATAPRES) 0.1 MG tablet, Take 0.1 mg by mouth 2 (two) times daily. Takes 1/2 TABLET, TWICE A DAY, Disp: , Rfl:  .  cloNIDine (CATAPRES) 0.2 MG tablet, TAKE 1 TABLET TWICE A DAY, Disp: 180 tablet, Rfl: 3 .  doxazosin (CARDURA) 8 MG tablet, , Disp: , Rfl:  .  furosemide (LASIX) 40 MG tablet, , Disp: , Rfl:  .  metoprolol tartrate (LOPRESSOR) 25 MG tablet, TAKE ONE-HALF (1/2) TABLET TWICE A DAY (DOSE DECREASE), Disp: 90 tablet, Rfl: 3 .  mirabegron ER (MYRBETRIQ) 25 MG TB24 tablet, Take 1 tablet (25 mg total) by mouth daily., Disp: 30 tablet, Rfl: 11 .  NON FORMULARY, [facility]  Onychomycosis Nail Lacquer -  Fluconazole 2%,  Terbinafine 1% DMSO Apply to affected nail once daily Qty. 120 gm 3 refills, Disp: , Rfl:  .  potassium chloride SA (K-DUR,KLOR-CON) 20 MEQ tablet, TAKE 1 TABLET DAILY (NEED APPOINTMENT FOR FURTHER REFILLS), Disp: 90 tablet, Rfl: 4 .  traMADol (ULTRAM) 50 MG tablet, , Disp: , Rfl:  .  triamcinolone cream (KENALOG) 0.1 %, APPLY  CREAM EXTERNALLY TO AFFECTED AREA TWICE DAILY, Disp: 15 g, Rfl: 5  Allergies  Allergen Reactions  . Levaquin [Levofloxacin In D5w]     Sleep disturbance  . Detrol [Tolterodine]     Abdominal pain    Objective:  Vascular Examination: Capillary refill time immediate x 10 digits  Dorsalis pedis and Posterior tibial pulses palpable b/l  Digital hair present x 10 digits  Skin temperature gradient WNL b/l  Dermatological Examination: Skin with normal turgor, texture and tone b/l  Toenails 1-5 b/l discolored, thick, dystrophic with subungual debris and pain with palpation to nailbeds due to thickness of nails.  Musculoskeletal: Muscle strength 5/5 to all LE muscle groups  No gross bony deformities b/l.  No pain, crepitus or joint limitation noted with ROM.   Neurological: Sensation intact with 10 gram monofilament.  Vibratory sensation intact.  Assessment: Painful onychomycosis toenails 1-5 b/l   Plan: 1. Toenails 1-5 b/l were debrided in length and girth without iatrogenic bleeding. We discussed her topical antifungal and I reiterated the timeframe for  which she can see some improvement. She related understanding. 2. Patient to continue soft, supportive shoe gear 3. Patient to report any pedal injuries to medical professional immediately. 4. Follow up 3 months.  5. Patient/POA to call should there be a concern in the interim.

## 2018-06-27 ENCOUNTER — Other Ambulatory Visit: Payer: Self-pay | Admitting: Family Medicine

## 2018-08-21 ENCOUNTER — Ambulatory Visit: Payer: Medicare HMO | Admitting: Cardiology

## 2018-09-05 ENCOUNTER — Other Ambulatory Visit: Payer: Self-pay | Admitting: Cardiology

## 2018-09-22 ENCOUNTER — Ambulatory Visit: Payer: Medicare HMO | Admitting: Podiatry

## 2018-10-05 ENCOUNTER — Other Ambulatory Visit: Payer: Self-pay | Admitting: Cardiology

## 2018-10-17 ENCOUNTER — Other Ambulatory Visit: Payer: Self-pay | Admitting: Cardiology

## 2019-01-06 ENCOUNTER — Other Ambulatory Visit (HOSPITAL_COMMUNITY): Payer: Self-pay | Admitting: Family Medicine

## 2019-01-06 DIAGNOSIS — Z1231 Encounter for screening mammogram for malignant neoplasm of breast: Secondary | ICD-10-CM

## 2019-01-09 ENCOUNTER — Telehealth: Payer: Self-pay | Admitting: Cardiology

## 2019-01-09 NOTE — Telephone Encounter (Signed)

## 2019-01-13 ENCOUNTER — Telehealth (INDEPENDENT_AMBULATORY_CARE_PROVIDER_SITE_OTHER): Payer: Medicare HMO | Admitting: Cardiology

## 2019-01-13 ENCOUNTER — Encounter: Payer: Self-pay | Admitting: Cardiology

## 2019-01-13 VITALS — BP 145/69 | HR 70 | Ht 63.0 in | Wt 189.0 lb

## 2019-01-13 DIAGNOSIS — R7301 Impaired fasting glucose: Secondary | ICD-10-CM | POA: Diagnosis not present

## 2019-01-13 DIAGNOSIS — R600 Localized edema: Secondary | ICD-10-CM | POA: Diagnosis not present

## 2019-01-13 DIAGNOSIS — I10 Essential (primary) hypertension: Secondary | ICD-10-CM

## 2019-01-13 MED ORDER — SPIRONOLACTONE 25 MG PO TABS: 12.5000 mg | ORAL_TABLET | Freq: Every day | ORAL | 1 refills | Status: DC

## 2019-01-13 NOTE — Patient Instructions (Addendum)
Your physician wants you to follow-up in: [name] will receive a reminder letter in the mail two months in advance. If you don't receive a letter, please call our office to schedule the follow-up appointment.  Your physician has recommended you make the following change in your medication:   START ALDACTONE 12.5 MG (1/2 TABLET) DAILY   Your physician recommends that you return for lab work [name] MAILED YOU [facility] FAST [place] TO LAB WORK   Your physician has requested that you regularly monitor and record your blood pressure readings at home FOR 2 [name]. Please use the same machine at the same time of day to check your readings and record them to bring to your follow-up visit.  Thank you for choosing [name]!!

## 2019-01-13 NOTE — Addendum Note (Signed)
Addended by: Julian Hy T on: 01/13/2019 10:24 AM   Modules accepted: Orders

## 2019-01-13 NOTE — Progress Notes (Signed)
Virtual Visit via Telephone Note   This visit type was conducted due to national recommendations for restrictions regarding the COVID-19 Pandemic (e.g. social distancing) in an effort to limit this patient's exposure and mitigate transmission in our community.  Due to her co-morbid illnesses, this patient is at least at moderate risk for complications without adequate follow up.  This format is felt to be most appropriate for this patient at this time.  The patient did not have access to video technology/had technical difficulties with video requiring transitioning to audio format only (telephone).  All issues noted in this document were discussed and addressed.  No physical exam could be performed with this format.  Please refer to the patient's chart for her  consent to telehealth for [facility].   Date:  01/13/2019   ID:  [name], DOB [birth year], MRN [id number]  Patient Location: Home Provider Location: Office  PCP:  Mikey Kirschner, MD  Cardiologist:  Carlyle Dolly, MD  Electrophysiologist:  None   Evaluation Performed:  Follow-Up Visit  Chief Complaint:  Follow up visit  History of Present Illness:    [name] is a 78 y.o. female seen today for follow up of the following medical problems.  1.HTN  - reports diagnosed at age 31, she states has always been difficult to control  - bp log 07/2014 with borderline low bp's and heart rates, lopressor decreased to 12.5mg  bid - history of white coat HTN, often clinic bp's elevated while home numbers at goal  - we had to lower her chlorthalidone previously due to AKI, Cr normalized with lower dose   - home bp's trending up, 140s/70s. She has gained 6 pounds and is eating out more since the pandemic started  2.LE edema - started several years ago - echo LVEF 65-70%, grade I   - some swellng at times, imprioved with comrpession hose. Avoiding any higher doses of diuretic due to prior AKI    3.  Dynamic LVOT gradient - on beta blocker. No recent symptoms.      SH: son previusly passed due to complications of diabetes  The patient does not have symptoms concerning for COVID-19 infection (fever, chills, cough, or new shortness of breath).    Past Medical History:  Diagnosis Date  . Allergic rhinitis   . Arthritis   . Asthma   . Hypertension   . IFG (impaired fasting glucose)   . Osteopenia   . Spinal stenosis    Past Surgical History:  Procedure Laterality Date  . ABDOMINAL HYSTERECTOMY    . BACK SURGERY    . CARPAL TUNNEL RELEASE    . CHOLECYSTECTOMY    . KNEE ARTHROSCOPY Left   . TOTAL ABDOMINAL HYSTERECTOMY W/ BILATERAL SALPINGOOPHORECTOMY       No outpatient medications have been marked as taking for the 01/13/19 encounter (Appointment) with Arnoldo Lenis, MD.     Allergies:   Levaquin [levofloxacin in d5w] and Detrol [tolterodine]   Social History   Tobacco Use  . Smoking status: Never Smoker  . Smokeless tobacco: Never Used  Substance Use Topics  . Alcohol use: Not on file  . Drug use: Never     Family Hx: The patient's family history includes Diabetes in her father and mother; Hypertension in her father and mother.  ROS:   Please see the history of present illness.     All other systems reviewed and are negative.   Prior CV studies:   The  following studies were reviewed today:  01/2014 echo Study Conclusions  - Left ventricle: The cavity size was normal. Wall thickness was increased in a pattern of mild LVH. Systolic function was vigorous. The estimated ejection fraction was in the range of 65% to 70%. There was dynamic obstruction during Valsalva in the mid cavity caused by vigorous LV systolic function, with a peak velocity of 393 cm/sec and a peak gradient of 62 mm Hg. Doppler parameters are consistent with abnormal left ventricular relaxation (grade 1 diastolic dysfunction). - Left atrium: The atrium was  mildly dilated. - Pulmonary arteries: PA peak pressure: 39 mm Hg (S). PASP is borderline elevated. - Systemic veins: The IVC is small, suggestive of low RA pressure and hypovolemia. - Technically adequate study.  Labs/Other Tests and Data Reviewed:    EKG:  n/a  Recent Labs: 02/10/2018: ALT 9; Hemoglobin 12.4; Platelets 286 03/04/2018: BUN 23; Creatinine, Ser 0.96; Potassium 4.0; Sodium 141   Recent Lipid Panel Lab Results  Component Value Date/Time   CHOL 156 02/10/2018 10:04 AM   TRIG 138 02/10/2018 10:04 AM   HDL 41 02/10/2018 10:04 AM   CHOLHDL 3.8 02/10/2018 10:04 AM   CHOLHDL 3.4 02/02/2015 08:41 AM   LDLCALC 87 02/10/2018 10:04 AM    Wt Readings from Last 3 Encounters:  02/24/18 182 lb 3.2 oz (82.6 kg)  02/19/18 183 lb (83 kg)  09/13/17 184 lb (83.5 kg)     Objective:    Vital Signs:   Today's Vitals   01/13/19 0924  BP: (!) 145/69  Pulse: 70  Weight: 189 lb (85.7 kg)  Height: 5\' 3"  (1.6 m)   Body mass index is 33.48 kg/m.  Normal affect. Normal speech pattern and tone. Comfortable, no apparent distress. No audible signso of SOb or wheezing.   ASSESSMENT & PLAN:    1. HTN  - above goal. Avoid higher chlorthalidone dosing as previously this caused an aki - start aldactone 12.5mg  daily, stop her KCl supplement. Check labs in 2 weeks, bp log in 2 weeks.   2. LE edema -reasonably controlled, continue diuretic and compression stockings. Avoid any higher doses of diuretic due to prior AKI.        COVID-19 Education: The signs and symptoms of COVID-19 were discussed with the patient and how to seek care for testing (follow up with PCP or arrange E-visit).  The importance of social distancing was discussed today.  Time:   Today, I have spent 16 minutes with the patient with telehealth technology discussing the above problems.     Medication Adjustments/Labs and Tests Ordered: Current medicines are reviewed at length with the patient today.   Concerns regarding medicines are outlined above.   Tests Ordered: No orders of the defined types were placed in this encounter.   Medication Changes: No orders of the defined types were placed in this encounter.   Follow Up:  In Person in 6 month(s)  Signed, Carlyle Dolly, MD  01/13/2019 8:18 AM    [place]

## 2019-01-23 ENCOUNTER — Other Ambulatory Visit: Payer: Self-pay | Admitting: Cardiology

## 2019-01-27 DIAGNOSIS — R69 Illness, unspecified: Secondary | ICD-10-CM | POA: Diagnosis not present

## 2019-01-29 ENCOUNTER — Other Ambulatory Visit: Payer: Self-pay | Admitting: Cardiology

## 2019-01-29 DIAGNOSIS — R7301 Impaired fasting glucose: Secondary | ICD-10-CM | POA: Diagnosis not present

## 2019-01-29 DIAGNOSIS — R600 Localized edema: Secondary | ICD-10-CM | POA: Diagnosis not present

## 2019-01-29 DIAGNOSIS — I10 Essential (primary) hypertension: Secondary | ICD-10-CM | POA: Diagnosis not present

## 2019-01-30 ENCOUNTER — Other Ambulatory Visit: Payer: Self-pay | Admitting: Cardiology

## 2019-01-30 LAB — BASIC METABOLIC PANEL
BUN/Creatinine Ratio: 16 (ref 12–28)
BUN: 20 mg/dL (ref 8–27)
CO2: 21 mmol/L (ref 20–29)
Calcium: 10.5 mg/dL — ABNORMAL HIGH (ref 8.7–10.3)
Chloride: 104 mmol/L (ref 96–106)
Creatinine, Ser: 1.27 mg/dL — ABNORMAL HIGH (ref 0.57–1.00)
GFR calc Af Amer: 47 mL/min/{1.73_m2} — ABNORMAL LOW (ref >59)
GFR calc non Af Amer: 41 mL/min/{1.73_m2} — ABNORMAL LOW (ref >59)
Glucose: 105 mg/dL — ABNORMAL HIGH (ref 65–99)
Potassium: 4.5 mmol/L (ref 3.5–5.2)
Sodium: 139 mmol/L (ref 134–144)

## 2019-01-30 LAB — CBC/DIFF AMBIGUOUS DEFAULT
Basophils Absolute: 0.1 10*3/uL (ref 0.0–0.2)
Basos: 1 %
EOS (ABSOLUTE): 0.2 10*3/uL (ref 0.0–0.4)
Eos: 2 %
Hematocrit: 39.3 % (ref 34.0–46.6)
Hemoglobin: 13 g/dL (ref 11.1–15.9)
Immature Grans (Abs): 0 10*3/uL (ref 0.0–0.1)
Immature Granulocytes: 0 %
Lymphocytes Absolute: 2.9 10*3/uL (ref 0.7–3.1)
Lymphs: 42 %
MCH: 29.4 pg (ref 26.6–33.0)
MCHC: 33.1 g/dL (ref 31.5–35.7)
MCV: 89 fL (ref 79–97)
Monocytes Absolute: 0.7 10*3/uL (ref 0.1–0.9)
Monocytes: 10 %
Neutrophils Absolute: 3 10*3/uL (ref 1.4–7.0)
Neutrophils: 45 %
Platelets: 379 10*3/uL (ref 150–450)
RBC: 4.42 x10E6/uL (ref 3.77–5.28)
RDW: 12.5 % (ref 11.7–15.4)
WBC: 6.8 10*3/uL (ref 3.4–10.8)

## 2019-01-30 LAB — HGB A1C W/O EAG: Hgb A1c MFr Bld: 6 % — ABNORMAL HIGH (ref 4.8–5.6)

## 2019-01-30 LAB — LIPID PANEL W/O CHOL/HDL RATIO
Cholesterol, Total: 152 mg/dL (ref 100–199)
HDL: 43 mg/dL (ref >39)
LDL Chol Calc (NIH): 92 mg/dL (ref 0–99)
Triglycerides: 93 mg/dL (ref 0–149)
VLDL Cholesterol Cal: 17 mg/dL (ref 5–40)

## 2019-01-30 LAB — SPECIMEN STATUS REPORT

## 2019-01-30 LAB — TSH: TSH: 0.578 u[IU]/mL (ref 0.450–4.500)

## 2019-01-30 LAB — MAGNESIUM: Magnesium: 1.7 mg/dL (ref 1.6–2.3)

## 2019-02-05 ENCOUNTER — Telehealth: Payer: Self-pay | Admitting: *Deleted

## 2019-02-05 NOTE — Telephone Encounter (Signed)
Pt aware - routed to pcp  

## 2019-02-05 NOTE — Telephone Encounter (Signed)
-----   Message from Arnoldo Lenis, MD sent at 02/05/2019 12:45 PM EDT ----- Labs look fine   Zandra Abts MD

## 2019-02-09 ENCOUNTER — Ambulatory Visit (HOSPITAL_COMMUNITY)
Admission: RE | Admit: 2019-02-09 | Discharge: 2019-02-09 | Disposition: A | Discharge status: Home / Self Care | Payer: Medicare HMO | Source: Ambulatory Visit | Attending: Family Medicine | Admitting: Family Medicine

## 2019-02-09 ENCOUNTER — Encounter (HOSPITAL_COMMUNITY): Payer: Self-pay

## 2019-02-09 ENCOUNTER — Other Ambulatory Visit: Payer: Self-pay

## 2019-02-09 DIAGNOSIS — Z1231 Encounter for screening mammogram for malignant neoplasm of breast: Secondary | ICD-10-CM | POA: Insufficient documentation

## 2019-05-18 ENCOUNTER — Telehealth: Payer: Self-pay | Admitting: Cardiology

## 2019-05-18 MED ORDER — SPIRONOLACTONE 25 MG PO TABS: 12.5000 mg | ORAL_TABLET | Freq: Every day | ORAL | 2 refills | Status: DC

## 2019-05-18 NOTE — Telephone Encounter (Signed)
Please send 90 day supply of spironolactone (ALDACTONE) 25 MG tablet [id number] ENDED To [facility]

## 2019-05-18 NOTE — Telephone Encounter (Signed)
Refill complete 

## 2019-05-26 IMAGING — MG 2D DIGITAL SCREENING BILATERAL MAMMOGRAM WITH CAD AND ADJUNCT TO
6 of 10 series · 6 of 26 positions shown · non-contrast
Comparison: Previous exam(s).

CLINICAL DATA: Screening.

EXAM:
2D DIGITAL SCREENING BILATERAL MAMMOGRAM WITH CAD AND ADJUNCT TOMO

[L MLO (1 of 2)]
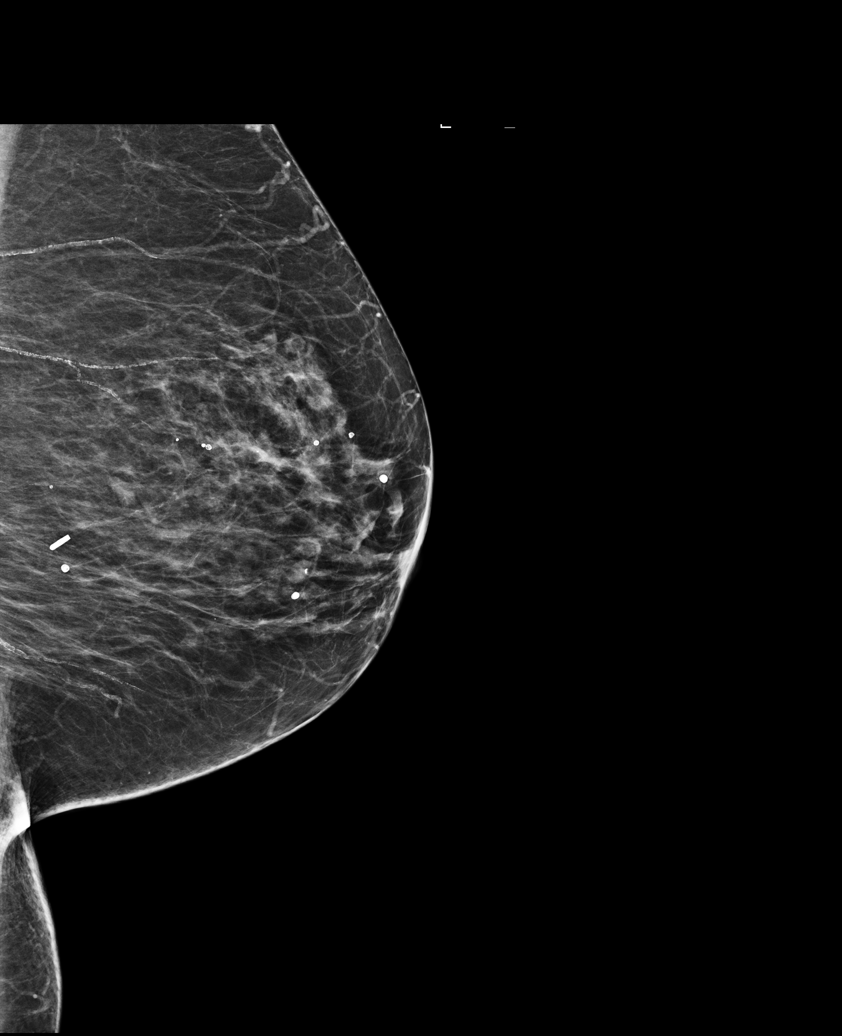

[R MLO (1 of 2)]
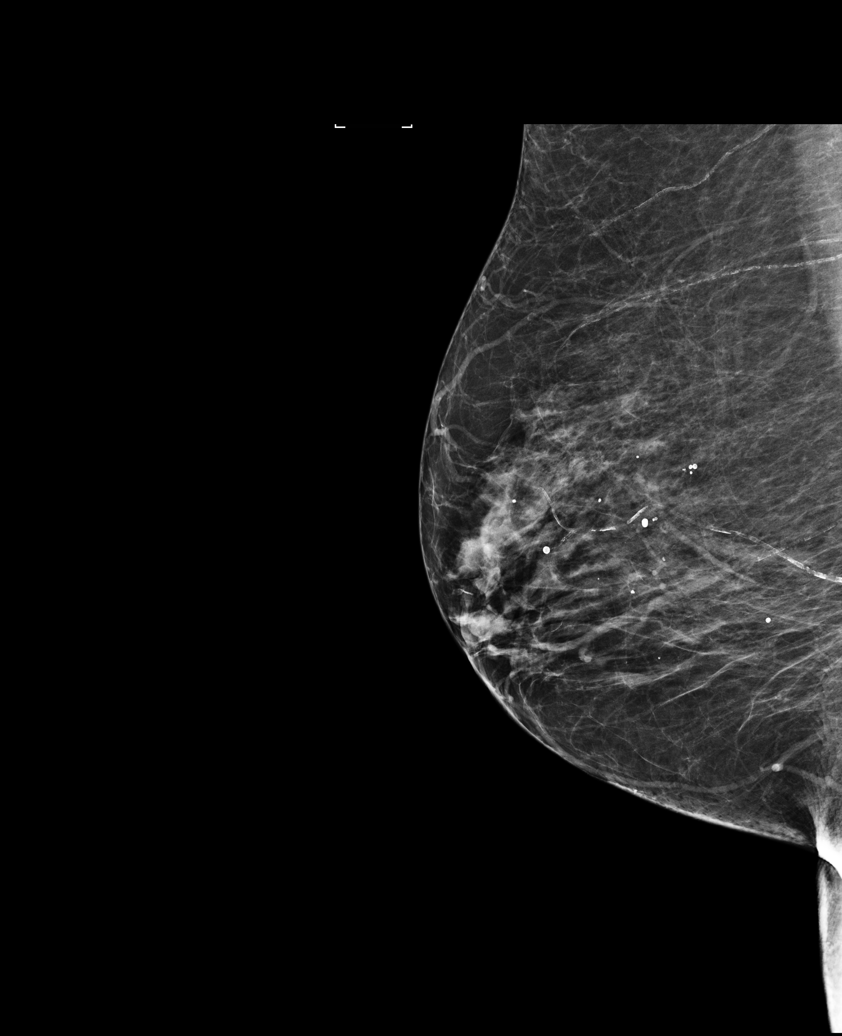

[L MLO (2 of 2)]
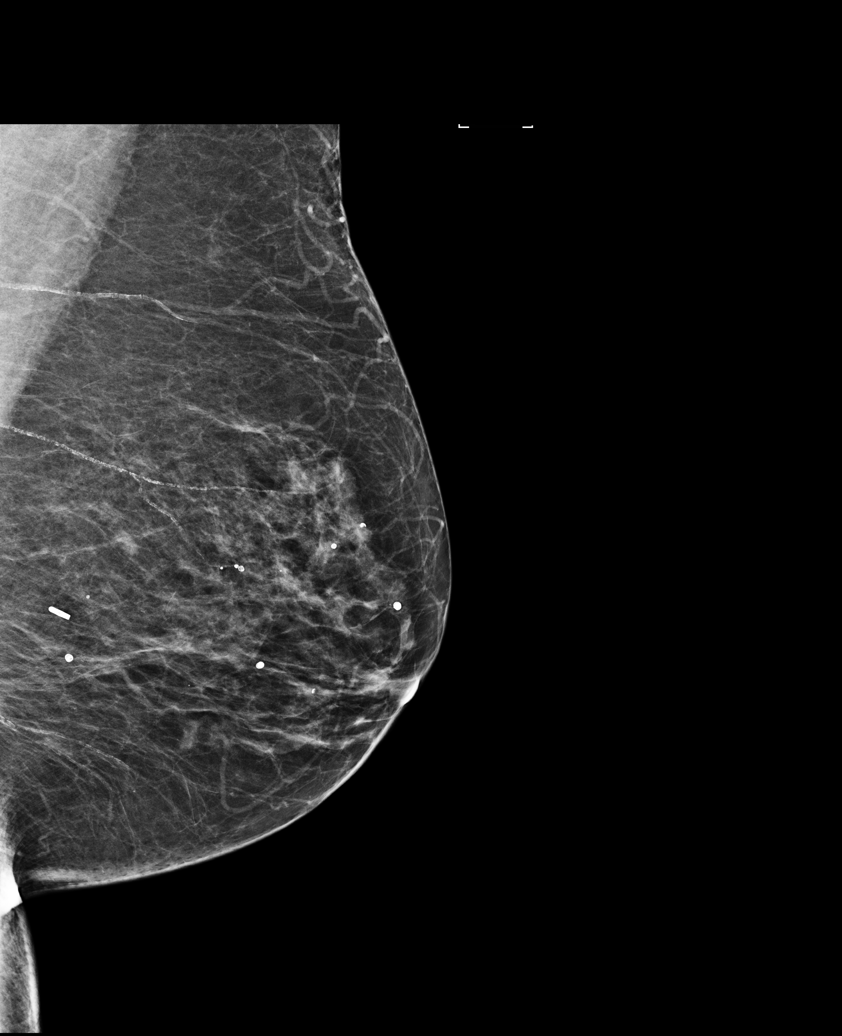

[R CC]
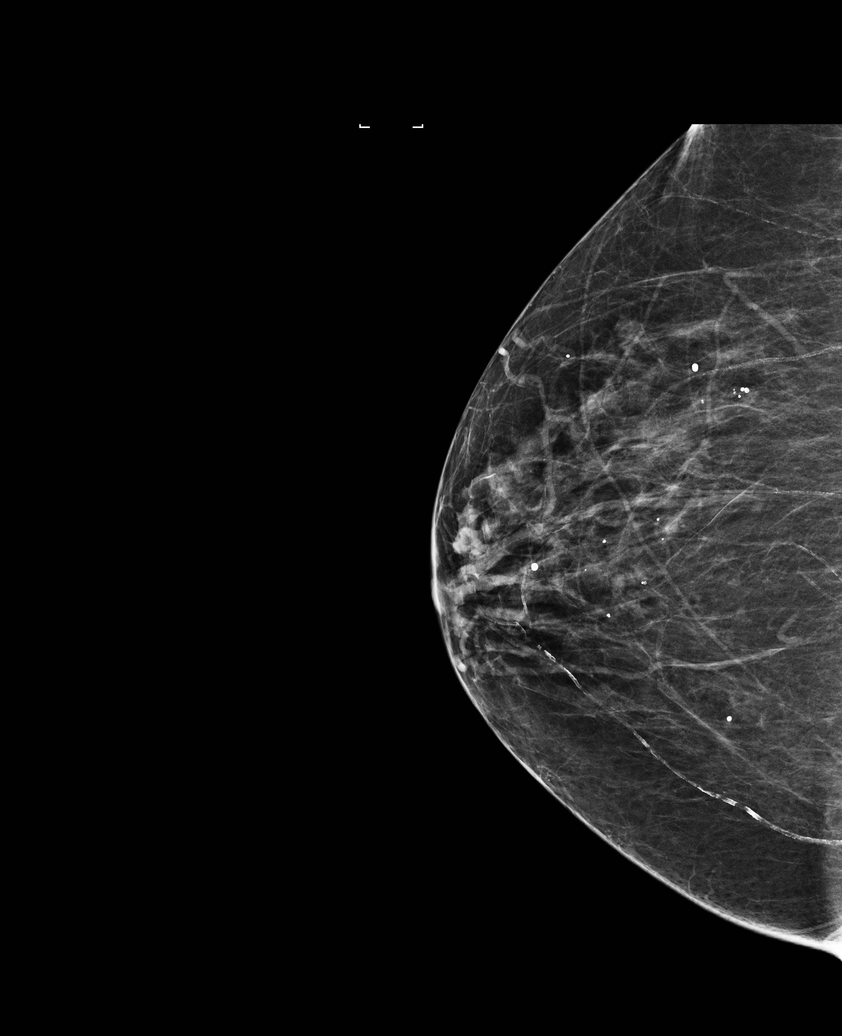

[L CC]
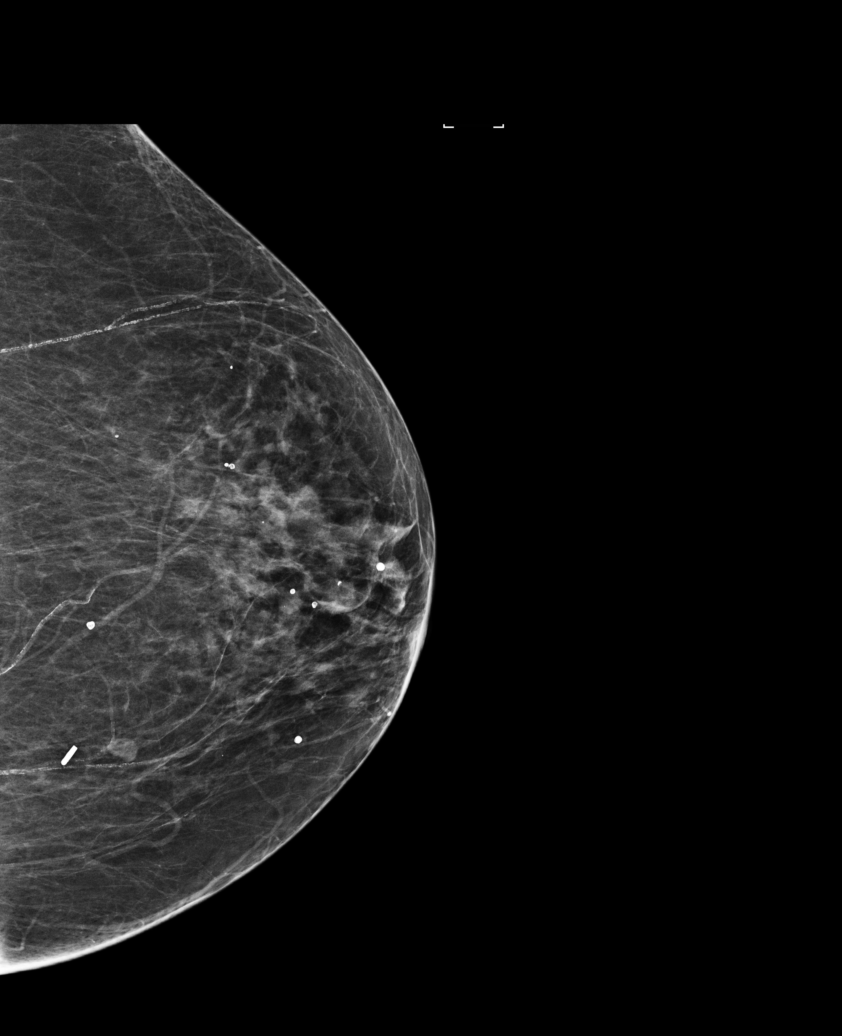

[R MLO (2 of 2)]
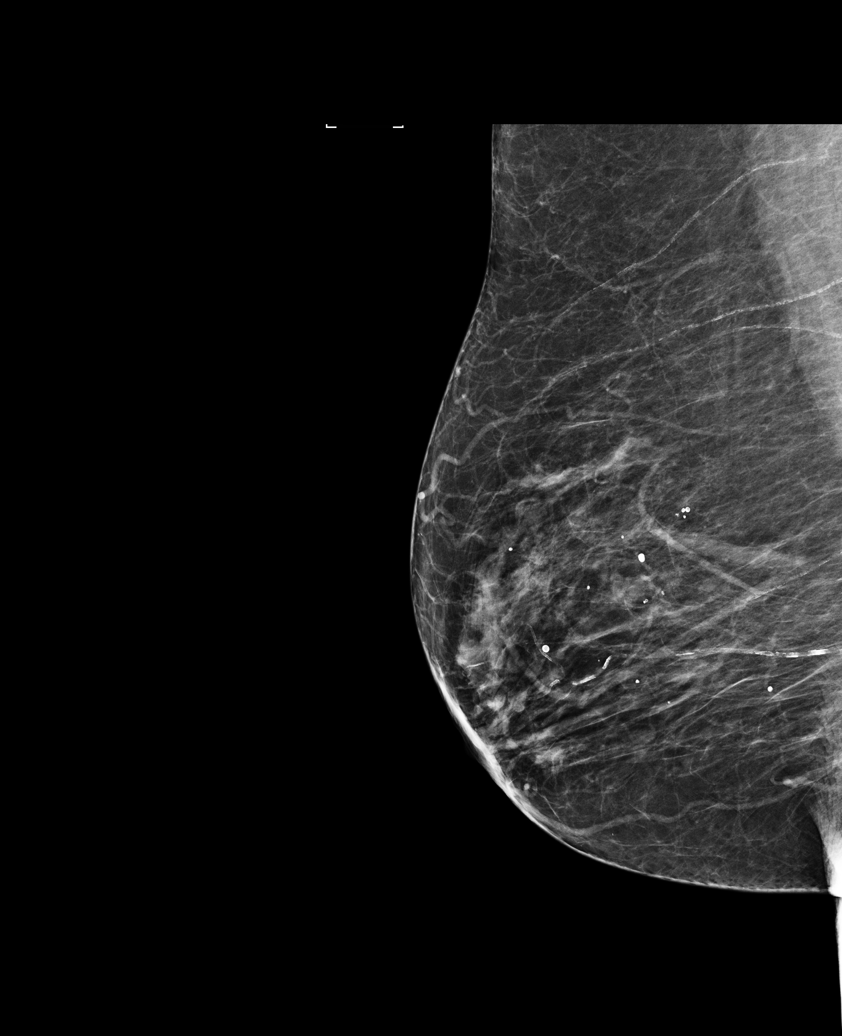

[6 of 26 positions shown; findings below may reference images not displayed]

ACR Breast Density Category b: There are scattered areas of
fibroglandular density.
FINDINGS: There are no findings suspicious for malignancy. Images were
processed with CAD.
IMPRESSION: No mammographic evidence of malignancy. A result letter of this
screening mammogram will be mailed directly to the patient.

RECOMMENDATION:
Screening mammogram in one year. (Code:97-6-RS4)

BI-RADS CATEGORY  1: Negative.

## 2019-06-18 ENCOUNTER — Encounter: Payer: Self-pay | Admitting: Family Medicine

## 2019-08-13 ENCOUNTER — Ambulatory Visit (INDEPENDENT_AMBULATORY_CARE_PROVIDER_SITE_OTHER): Payer: Medicare HMO | Admitting: Family Medicine

## 2019-08-13 ENCOUNTER — Encounter: Payer: Self-pay | Admitting: Family Medicine

## 2019-08-13 VITALS — BP 150/66 | HR 72 | Ht 63.0 in | Wt 191.0 lb

## 2019-08-13 DIAGNOSIS — I10 Essential (primary) hypertension: Secondary | ICD-10-CM

## 2019-08-13 DIAGNOSIS — R600 Localized edema: Secondary | ICD-10-CM

## 2019-08-13 MED ORDER — CLONIDINE HCL 0.2 MG PO TABS: ORAL_TABLET | ORAL | 1 refills | Status: DC

## 2019-08-13 NOTE — Progress Notes (Signed)
Cardiology Office Note  Date: 08/13/2019   ID: [name], DOB 01-Nov-1940, MRN [id number]  PCP:  Mikey Kirschner, MD  Cardiologist:  Carlyle Dolly, MD Electrophysiologist:  None   Chief Complaint: F/U HTN, LE edema,  History of Present Illness: [name] is a 79 y.o. female with a history of hypertension and lower extremity edema.  Last encounter with Dr. Harl Bowie via telemedicine visit. Her blood pressures were trending up in the 140s over 70s and she had gained 6 pounds since the pandemic started.  She had some LE swelling at times improved with compression hose.  Higher doses of diuretic were being avoided at that time due to prior acute kidney injury.  She was started on Aldactone 12.5 mg 1/2 tablet daily.  Patient states she has been doing well except for some weight gain since the pandemic started.  States she has probably gained about 12 pounds.  She denies any other recent acute illnesses, hospitalizations, travels.  She has had the Covid vaccine.  She denies any progressive anginal or exertional symptoms, palpitations or arrhythmias, orthostatic symptoms, stroke or TIA-like symptoms, PND orthopnea, DVT or PE-like symptoms.  She does have mild lower extremity edema and wears compression stockings.  She is using a Rollator to help with ambulation.  She brings her blood pressure machine with her demonstrating SBP's in the range of high 140s to high 150s over time.  Patient states they have attempted to increase her metoprolol in the past and she becomes hypotensive.  She states her chlorthalidone has been increased in the past causing her kidney function to decrease.  She takes clonidine 0.2 mg 1/2 pill in a.m. and 1/2 pill in the evening  Past Medical History:  Diagnosis Date  . Allergic rhinitis   . Arthritis   . Asthma   . Hypertension   . IFG (impaired fasting glucose)   . Osteopenia   . Spinal stenosis     Past Surgical History:  Procedure Laterality Date   . ABDOMINAL HYSTERECTOMY    . BACK SURGERY    . BREAST EXCISIONAL BIOPSY Left    benign  . CARPAL TUNNEL RELEASE    . CHOLECYSTECTOMY    . KNEE ARTHROSCOPY Left   . TOTAL ABDOMINAL HYSTERECTOMY W/ BILATERAL SALPINGOOPHORECTOMY      Current Outpatient Medications  Medication Sig Dispense Refill  . acetaminophen (TYLENOL) 325 MG tablet Take 650 mg by mouth every 6 (six) hours as needed.    Marland Kitchen albuterol (VENTOLIN HFA) 108 (90 Base) MCG/ACT inhaler INHALE TWO PUFFS BY MOUTH EVERY 6 HOURS AS NEEDED FOR WHEEZING 18 each 5  . amLODipine-benazepril (LOTREL) 10-40 MG capsule TAKE 1 CAPSULE DAILY 90 capsule 3  . aspirin EC 81 MG tablet Take 81 mg by mouth daily.    . Calcium Carbonate-Vit D-Min (CALCIUM 1200 PO) Take 1 tablet by mouth 2 (two) times daily.     . carboxymethylcellulose (REFRESH PLUS) 0.5 % SOLN Place 1 drop into both eyes 2 (two) times daily as needed. Dry eyes    . chlorthalidone (HYGROTON) 25 MG tablet TAKE ONE-HALF (1/2) TABLET DAILY 45 tablet 3  . cloNIDine (CATAPRES) 0.2 MG tablet Take 0.1 mg by mouth 2 (two) times daily. 1/2 tablet in the morning and 1/2 tablet in the evening     . metoprolol tartrate (LOPRESSOR) 25 MG tablet TAKE ONE-HALF (1/2) TABLET TWICE A DAY (DOSE DECREASE) 90 tablet 3  . NON FORMULARY [facility]  Onychomycosis Nail Lacquer -  Fluconazole 2%, Terbinafine 1% DMSO Apply to affected nail once daily Qty. 120 gm 3 refills    . spironolactone (ALDACTONE) 25 MG tablet Take 0.5 tablets (12.5 mg total) by mouth daily. 45 tablet 2  . triamcinolone cream (KENALOG) 0.1 % APPLY  CREAM EXTERNALLY TO AFFECTED AREA TWICE DAILY 15 g 5   No current facility-administered medications for this visit.   Allergies:  Levaquin [levofloxacin in d5w] and Detrol [tolterodine]   Social History: The patient  reports that she has never smoked. She has never used smokeless tobacco. She reports that she does not use drugs.   Family History: The patient's family history  includes Diabetes in her father and mother; Hypertension in her father and mother.   ROS:  Please see the history of present illness. Otherwise, complete review of systems is positive for none.  All other systems are reviewed and negative.   Physical Exam: VS:  BP (!) 150/66   Pulse 72   Ht 5\' 3"  (1.6 m)   Wt 191 lb (86.6 kg)   SpO2 98%   BMI 33.83 kg/m , BMI Body mass index is 33.83 kg/m.  Wt Readings from Last 3 Encounters:  08/13/19 191 lb (86.6 kg)  01/13/19 189 lb (85.7 kg)  02/24/18 182 lb 3.2 oz (82.6 kg)    General: Patient appears comfortable at rest. Neck: Supple, no elevated JVP or carotid bruits, no thyromegaly. Lungs: Clear to auscultation, nonlabored breathing at rest. Cardiac: Regular rate and rhythm, no S3 or significant systolic murmur, no pericardial rub. Abdomen: Soft, nontender, no hepatomegaly, bowel sounds present, no guarding or rebound. Extremities: No pitting edema, distal pulses 2+. Skin: Warm and dry. Musculoskeletal: No kyphosis. Neuropsychiatric: Alert and oriented x3, affect grossly appropriate.  ECG:  An ECG dated 08/13/2019 was personally reviewed today and demonstrated:  Normal sinus rhythm rate of 78  Recent Labwork: 01/29/2019: BUN 20; Creatinine, Ser 1.27; Hemoglobin 13.0; Magnesium 1.7; Platelets 379; Potassium 4.5; Sodium 139; TSH 0.578     Component Value Date/Time   CHOL 152 01/29/2019 0924   TRIG 93 01/29/2019 0924   HDL 43 01/29/2019 0924   CHOLHDL 3.8 02/10/2018 1004   CHOLHDL 3.4 02/02/2015 0841   VLDL 19 02/02/2015 0841   LDLCALC 92 01/29/2019 0924    Other Studies Reviewed Today:  01/2014 echo Study Conclusions  - Left ventricle: The cavity size was normal. Wall thickness was increased in a pattern of mild LVH. Systolic function was vigorous. The estimated ejection fraction was in the range of 65% to 70%. There was dynamic obstruction during Valsalva in the mid cavity caused by vigorous LV systolic function, with a  peak velocity of 393 cm/sec and a peak gradient of 62 mm Hg. Doppler parameters are consistent with abnormal left ventricular relaxation (grade 1 diastolic dysfunction). - Left atrium: The atrium was mildly dilated. - Pulmonary arteries: PA peak pressure: 39 mm Hg (S). PASP is borderline elevated. - Systemic veins: The IVC is small, suggestive of low RA pressure and hypovolemia. - Technically adequate study.  Assessment and Plan:  1. Essential hypertension   2. Leg edema    1. Essential hypertension Patient's blood pressure is elevated on arrival today.  150/66.  Recheck was 150/68.  She brought her blood pressure machine with her today which demonstrates sustained systolic blood pressures ranging between high 140s to mid 150s.  Advised her to increase her a.m. dose of clonidine to 0.2 mg and keep the nighttime dose at 0.1 mg.  Call  in 2 weeks with recorded blood pressures and we will adjust if needed.  Continue amlodipine/benazepril 10/40 mg daily.  Continue chlorthalidone 25 mg 1/2 tablet daily.  Continue metoprolol tartrate 25 mg one half p.o. twice a day.  Continue spironolactone 12.5 mg daily.  2. Leg edema She has a history of chronic lower extremity edema.  She is on combination amlodipine benazepril.  This may be contributing some to the lower extremity edema.  She is wearing compression stockings and has minimal lower extremity edema on exam.  Medication Adjustments/Labs and Tests Ordered: Current medicines are reviewed at length with the patient today.  Concerns regarding medicines are outlined above.   Disposition: Follow-up with Dr Harl Bowie or APP 6 months  Signed, Levell July, NP 08/13/2019 4:35 PM    [facility] [place], [place], [place] [address] Phone: [phone]; Fax: [phone]

## 2019-08-13 NOTE — Patient Instructions (Addendum)
Your physician wants you to follow-up in: [place] will receive a reminder letter in the mail two months in advance. If you don't receive a letter, please call our office to schedule the follow-up appointment.  Your physician has recommended you make the following change in your medication:   INCREASE CLONIDINE 1 TABLET IN THE MORNING (0.2MG ) AND 1/2 TABLET IN THE EVENING (0.1MG )  Your physician has requested that you regularly monitor and record your blood pressure readings at home. Please use the same machine at the same time of day to check your readings FOR 2 WEEKS AND CALL us WITH READINGS    Thank you for choosing [facility]!!

## 2019-08-24 ENCOUNTER — Telehealth: Payer: Self-pay | Admitting: Family Medicine

## 2019-08-24 MED ORDER — CLONIDINE HCL 0.2 MG PO TABS: 0.1000 mg | ORAL_TABLET | Freq: Two times a day (BID) | ORAL | 1 refills | Status: DC

## 2019-08-24 NOTE — Telephone Encounter (Signed)
Okay sounds good

## 2019-08-24 NOTE — Telephone Encounter (Signed)
Tell her to cut the morning dose of clonidine to a half pill. = 0.1 mg. Keep the night time dose the same

## 2019-08-24 NOTE — Telephone Encounter (Signed)
Took new medication BP dropped into 90's and she started taking her old medication and her BP still running high

## 2019-08-24 NOTE — Telephone Encounter (Signed)
Started clonidine 0.2 mg in the morning and 1/2 tablet in the evening on 08/14/2019 08/19/2019 BP 134/64 & HR 69 08/20/2019 BP 104/55 & HR 51 08/21/2019 BP 99/53 & HR 56-felt sleepy and had no energy No BP reading today. Monitor would not read today by digital monitor. Per patient, if BP is very high, the monitor will not read. Reports that she resumed her previous dosage of clonidine on Saturday 08/22/2019 (clonidine 0.2 mg) taking 1/2 tablet by mouth twice daily Monitor has been checked for accuracy

## 2019-08-24 NOTE — Telephone Encounter (Signed)
Stated she got disconnected and asked to call her back

## 2019-08-24 NOTE — Telephone Encounter (Signed)
Patient informed and verbalized understanding of plan. 

## 2019-08-27 ENCOUNTER — Other Ambulatory Visit: Payer: Self-pay

## 2019-08-27 ENCOUNTER — Ambulatory Visit: Payer: Medicare HMO | Admitting: *Deleted

## 2019-08-27 DIAGNOSIS — I10 Essential (primary) hypertension: Secondary | ICD-10-CM

## 2019-08-27 NOTE — Progress Notes (Signed)
Pt here for BP check per recent phone note - taking clonidine 0.1 mg 1/2 tablet daily - denies any symptoms - BP today 178/62 HR 88 - brought in home BP monitor which did not register SBP - pt will go to pharmacy to get replacement - routed to provider

## 2019-08-31 ENCOUNTER — Other Ambulatory Visit: Payer: Self-pay | Admitting: Cardiology

## 2019-09-04 ENCOUNTER — Other Ambulatory Visit: Payer: Self-pay | Admitting: Family Medicine

## 2019-09-04 NOTE — Telephone Encounter (Signed)
Last med check up oct 2019

## 2019-10-06 ENCOUNTER — Other Ambulatory Visit: Payer: Self-pay | Admitting: Cardiology

## 2019-10-12 ENCOUNTER — Other Ambulatory Visit: Payer: Self-pay | Admitting: Cardiology

## 2020-01-15 ENCOUNTER — Other Ambulatory Visit (HOSPITAL_COMMUNITY): Payer: Self-pay | Admitting: Family Medicine

## 2020-01-15 DIAGNOSIS — Z1231 Encounter for screening mammogram for malignant neoplasm of breast: Secondary | ICD-10-CM

## 2020-01-19 ENCOUNTER — Other Ambulatory Visit: Payer: Self-pay | Admitting: Cardiology

## 2020-02-08 NOTE — Progress Notes (Deleted)
Cardiology Office Note    Date:  02/08/2020   ID:  [name], DOB [birth year], MRN [id number]  PCP:  Erven Colla, DO  Cardiologist: Carlyle Dolly, MD EPS: None  No chief complaint on file.   History of Present Illness:  [name] is a 79 y.o. female with history of difficult to control hypertension, dynamic LVOT on beta-blocker, lower extremity edema managed with compression hose and diuretic.  Have to avoid higher doses of chlorthalidone because of AKI.  Last office visit with Levell July, NP 08/13/2019 clonidine increased to 0.2 mg in the morning 0.1 mg in the evening blood pressures got too low and then she was reduced to 0.1 mg twice daily.    Past Medical History:  Diagnosis Date  . Allergic rhinitis   . Arthritis   . Asthma   . Hypertension   . IFG (impaired fasting glucose)   . Osteopenia   . Spinal stenosis     Past Surgical History:  Procedure Laterality Date  . ABDOMINAL HYSTERECTOMY    . BACK SURGERY    . BREAST EXCISIONAL BIOPSY Left    benign  . CARPAL TUNNEL RELEASE    . CHOLECYSTECTOMY    . KNEE ARTHROSCOPY Left   . TOTAL ABDOMINAL HYSTERECTOMY W/ BILATERAL SALPINGOOPHORECTOMY      Current Medications: No outpatient medications have been marked as taking for the 02/09/20 encounter (Appointment) with Imogene Burn, PA-C.     Allergies:   Levaquin [levofloxacin in d5w] and Detrol [tolterodine]   Social History   Socioeconomic History  . Marital status: Married    Spouse name: Not on file  . Number of children: Not on file  . Years of education: Not on file  . Highest education level: Not on file  Occupational History  . Not on file  Tobacco Use  . Smoking status: Never Smoker  . Smokeless tobacco: Never Used  Vaping Use  . Vaping Use: Never used  Substance and Sexual Activity  . Alcohol use: Not on file  . Drug use: Never  . Sexual activity: Not on file  Other Topics Concern  . Not on file  Social History  Narrative  . Not on file   Social Determinants of Health   Financial Resource Strain:   . Difficulty of Paying Living Expenses: Not on file  Food Insecurity:   . Worried About Charity fundraiser in the Last Year: Not on file  . Ran Out of Food in the Last Year: Not on file  Transportation Needs:   . Lack of Transportation (Medical): Not on file  . Lack of Transportation (Non-Medical): Not on file  Physical Activity:   . Days of Exercise per Week: Not on file  . Minutes of Exercise per Session: Not on file  Stress:   . Feeling of Stress : Not on file  Social Connections:   . Frequency of Communication with Friends and Family: Not on file  . Frequency of Social Gatherings with Friends and Family: Not on file  . Attends Religious Services: Not on file  . Active Member of Clubs or Organizations: Not on file  . Attends Archivist Meetings: Not on file  . Marital Status: Not on file     Family History:  The patient's ***family history includes Diabetes in her father and mother; Hypertension in her father and mother.   ROS:   Please see the history of present illness.    ROS  All other systems reviewed and are negative.   PHYSICAL EXAM:   VS:  There were no vitals taken for this visit.  Physical Exam  GEN: Well nourished, well developed, in no acute distress  HEENT: normal  Neck: no JVD, carotid bruits, or masses Cardiac:RRR; no murmurs, rubs, or gallops  Respiratory:  clear to auscultation bilaterally, normal work of breathing GI: soft, nontender, nondistended, + BS Ext: without cyanosis, clubbing, or edema, Good distal pulses bilaterally MS: no deformity or atrophy  Skin: warm and dry, no rash Neuro:  Alert and Oriented x 3, Strength and sensation are intact Psych: euthymic mood, full affect  Wt Readings from Last 3 Encounters:  08/13/19 191 lb (86.6 kg)  01/13/19 189 lb (85.7 kg)  02/24/18 182 lb 3.2 oz (82.6 kg)      Studies/Labs Reviewed:   EKG:  EKG  is*** ordered today.  The ekg ordered today demonstrates ***  Recent Labs: No results found for requested labs within last 8760 hours.   Lipid Panel    Component Value Date/Time   CHOL 152 01/29/2019 0924   TRIG 93 01/29/2019 0924   HDL 43 01/29/2019 0924   CHOLHDL 3.8 02/10/2018 1004   CHOLHDL 3.4 02/02/2015 0841   VLDL 19 02/02/2015 0841   LDLCALC 92 01/29/2019 0924    Additional studies/ records that were reviewed today include:  ***    ASSESSMENT:    No diagnosis found.   PLAN:  In order of problems listed above:  Essential hypertension goal to control chlorthalidone lowered in the past because of AKI  Dynamic LVOT gradient on beta-blocker  Lower extremity edema on diuretic and compression stockings avoid higher dose diuretics due to AKI.    Medication Adjustments/Labs and Tests Ordered: Current medicines are reviewed at length with the patient today.  Concerns regarding medicines are outlined above.  Medication changes, Labs and Tests ordered today are listed in the Patient Instructions below. There are no Patient Instructions on file for this visit.   Sumner Boast, PA-C  02/08/2020 2:37 PM    [facility] [address], [place], [place]  [address] Phone: [phone]; Fax: [phone]

## 2020-02-09 ENCOUNTER — Telehealth: Payer: Self-pay

## 2020-02-09 ENCOUNTER — Ambulatory Visit: Payer: Medicare HMO | Admitting: Physician Assistant

## 2020-02-09 DIAGNOSIS — R7301 Impaired fasting glucose: Secondary | ICD-10-CM

## 2020-02-09 DIAGNOSIS — Z79899 Other long term (current) drug therapy: Secondary | ICD-10-CM

## 2020-02-09 DIAGNOSIS — I10 Essential (primary) hypertension: Secondary | ICD-10-CM

## 2020-02-09 NOTE — Telephone Encounter (Signed)
Yes, please order these, fasting before appt. Thx. Dr. Lovena Le

## 2020-02-09 NOTE — Telephone Encounter (Signed)
Last labs completed 02/10/18 CBC, BMET, HEPATIC and LIPID. Please advise. Thank you

## 2020-02-09 NOTE — Telephone Encounter (Signed)
Pt has CPE 11/3 and would like lab work done before appt.

## 2020-02-10 NOTE — Telephone Encounter (Signed)
Pt returned call and verbalized understanding  

## 2020-02-10 NOTE — Telephone Encounter (Signed)
Blood work ordered in Epic. Left message to return call to notify patient. 

## 2020-02-11 ENCOUNTER — Ambulatory Visit (HOSPITAL_COMMUNITY): Payer: Medicare HMO

## 2020-02-11 ENCOUNTER — Ambulatory Visit (HOSPITAL_COMMUNITY)
Admission: RE | Admit: 2020-02-11 | Discharge: 2020-02-11 | Disposition: A | Discharge status: Home / Self Care | Payer: Medicare HMO | Source: Ambulatory Visit | Attending: Family Medicine | Admitting: Family Medicine

## 2020-02-11 ENCOUNTER — Other Ambulatory Visit: Payer: Self-pay

## 2020-02-11 DIAGNOSIS — Z1231 Encounter for screening mammogram for malignant neoplasm of breast: Secondary | ICD-10-CM | POA: Diagnosis not present

## 2020-03-07 ENCOUNTER — Other Ambulatory Visit: Payer: Self-pay | Admitting: Family Medicine

## 2020-03-08 LAB — CBC WITH DIFFERENTIAL/PLATELET
Basophils Absolute: 0 10*3/uL (ref 0.0–0.2)
Basos: 1 %
EOS (ABSOLUTE): 0.2 10*3/uL (ref 0.0–0.4)
Eos: 2 %
Hematocrit: 39.9 % (ref 34.0–46.6)
Hemoglobin: 13.3 g/dL (ref 11.1–15.9)
Immature Grans (Abs): 0 10*3/uL (ref 0.0–0.1)
Immature Granulocytes: 0 %
Lymphocytes Absolute: 3.6 10*3/uL — ABNORMAL HIGH (ref 0.7–3.1)
Lymphs: 43 %
MCH: 30.2 pg (ref 26.6–33.0)
MCHC: 33.3 g/dL (ref 31.5–35.7)
MCV: 91 fL (ref 79–97)
Monocytes Absolute: 0.6 10*3/uL (ref 0.1–0.9)
Monocytes: 7 %
Neutrophils Absolute: 3.9 10*3/uL (ref 1.4–7.0)
Neutrophils: 47 %
Platelets: 350 10*3/uL (ref 150–450)
RBC: 4.41 x10E6/uL (ref 3.77–5.28)
RDW: 11.9 % (ref 11.7–15.4)
WBC: 8.4 10*3/uL (ref 3.4–10.8)

## 2020-03-08 LAB — BASIC METABOLIC PANEL
BUN/Creatinine Ratio: 20 (ref 12–28)
BUN: 26 mg/dL (ref 8–27)
CO2: 20 mmol/L (ref 20–29)
Calcium: 10.8 mg/dL — ABNORMAL HIGH (ref 8.7–10.3)
Chloride: 106 mmol/L (ref 96–106)
Creatinine, Ser: 1.32 mg/dL — ABNORMAL HIGH (ref 0.57–1.00)
GFR calc Af Amer: 44 mL/min/{1.73_m2} — ABNORMAL LOW (ref >59)
GFR calc non Af Amer: 38 mL/min/{1.73_m2} — ABNORMAL LOW (ref >59)
Glucose: 114 mg/dL — ABNORMAL HIGH (ref 65–99)
Potassium: 5.2 mmol/L (ref 3.5–5.2)
Sodium: 142 mmol/L (ref 134–144)

## 2020-03-08 LAB — LIPID PANEL
Chol/HDL Ratio: 3.2 ratio (ref 0.0–4.4)
Cholesterol, Total: 172 mg/dL (ref 100–199)
HDL: 53 mg/dL (ref >39)
LDL Chol Calc (NIH): 103 mg/dL — ABNORMAL HIGH (ref 0–99)
Triglycerides: 84 mg/dL (ref 0–149)
VLDL Cholesterol Cal: 16 mg/dL (ref 5–40)

## 2020-03-08 LAB — HEPATIC FUNCTION PANEL
ALT: 12 IU/L (ref 0–32)
AST: 19 IU/L (ref 0–40)
Albumin: 4.5 g/dL (ref 3.7–4.7)
Alkaline Phosphatase: 93 IU/L (ref 44–121)
Bilirubin Total: 0.4 mg/dL (ref 0.0–1.2)
Bilirubin, Direct: 0.1 mg/dL (ref 0.00–0.40)
Total Protein: 8.2 g/dL (ref 6.0–8.5)

## 2020-03-16 ENCOUNTER — Encounter: Payer: Self-pay | Admitting: Family Medicine

## 2020-03-16 ENCOUNTER — Ambulatory Visit (INDEPENDENT_AMBULATORY_CARE_PROVIDER_SITE_OTHER): Payer: Medicare HMO | Admitting: Family Medicine

## 2020-03-16 ENCOUNTER — Other Ambulatory Visit: Payer: Self-pay

## 2020-03-16 VITALS — BP 168/80 | HR 82 | Temp 98.1°F | Wt 185.0 lb

## 2020-03-16 DIAGNOSIS — Z0000 Encounter for general adult medical examination without abnormal findings: Secondary | ICD-10-CM

## 2020-03-16 DIAGNOSIS — I10 Essential (primary) hypertension: Secondary | ICD-10-CM

## 2020-03-16 DIAGNOSIS — R7301 Impaired fasting glucose: Secondary | ICD-10-CM

## 2020-03-16 LAB — POCT GLYCOSYLATED HEMOGLOBIN (HGB A1C): Hemoglobin A1C: 5.7 % — AB (ref 4.0–5.6)

## 2020-03-16 NOTE — Progress Notes (Signed)
Patient ID: [name], female    DOB: [birth year], 79 y.o.   MRN: [id number]   Chief Complaint  Patient presents with  . Annual Exam   Subjective:    HPI  AWV- Annual Wellness Visit  The patient was seen for their annual wellness visit. The patient's past medical history, surgical history, and family history were reviewed. Pertinent vaccines were reviewed ( tetanus, pneumonia, shingles, flu) The patient's medication list was reviewed and updated.  The height and weight were entered.  BMI recorded in electronic record elsewhere  Cognitive screening was completed. Outcome of Mini - Cog: PASS   Falls /depression screening electronically recorded within record elsewhere  Current tobacco usage: none (All patients who use tobacco were given written and verbal information on quitting)  Recent listing of emergency department/hospitalizations over the past year were reviewed.  current specialist the patient sees on a regular basis: none   Medicare annual wellness visit patient questionnaire was reviewed.  A written screening schedule for the patient for the next 5-10 years was given. Appropriate discussion of followup regarding next visit was discussed.  Dr. Harl Bowie, Cardiologist, stopped one of her meds due to elevated Cr. Cr-1.2-1.4 dec to 1/2 tab of chlorthalidone 25mg . arthitits pain in shoulders taking tylenol.  Not on nsaids.  Calcium 10.8, inc from last time 10.5. tsh 1 yr ago normal. Glucose slight elevation 114.  H/o impaired fasting- Last a1c 6.0.  H/o bladder spasm- Tried bladder spasm meds and didn't work. Mybetriq, oxybutinin.  bp meds- didn't have them all yet. - had morning medications already. Long h/o white coat syndrome in chart of cardiologist.  Inc her clonidine and dec chlorthalidone.  Using rolling seated walker.   Medical History [name] has a past medical history of Allergic rhinitis, Arthritis, Asthma, Hypertension, IFG (impaired  fasting glucose), Osteopenia, and Spinal stenosis.   Outpatient Encounter Medications as of 03/16/2020  Medication Sig  . acetaminophen (TYLENOL) 325 MG tablet Take 650 mg by mouth every 6 (six) hours as needed.  Marland Kitchen albuterol (VENTOLIN HFA) 108 (90 Base) MCG/ACT inhaler INHALE TWO PUFFS BY MOUTH EVERY 6 HOURS AS NEEDED FOR WHEEZING  . amLODipine-benazepril (LOTREL) 10-40 MG capsule TAKE 1 CAPSULE DAILY  . aspirin EC 81 MG tablet Take 81 mg by mouth daily.  . Calcium Carbonate-Vit D-Min (CALCIUM 1200 PO) Take 1 tablet by mouth 2 (two) times daily.   . carboxymethylcellulose (REFRESH PLUS) 0.5 % SOLN Place 1 drop into both eyes 2 (two) times daily as needed. Dry eyes  . chlorthalidone (HYGROTON) 25 MG tablet TAKE ONE-HALF (1/2) TABLET DAILY  . cloNIDine (CATAPRES) 0.2 MG tablet TAKE ONE TABLET IN THE MORNING AND ONE-HALF TABLET IN THE EVENING (DOSE CHANGE)  . metoprolol tartrate (LOPRESSOR) 25 MG tablet TAKE ONE-HALF (1/2) TABLET TWICE A DAY (DOSE DECREASE)  . NON FORMULARY [facility]  Onychomycosis Nail Lacquer -  Fluconazole 2%, Terbinafine 1% DMSO Apply to affected nail once daily Qty. 120 gm 3 refills  . spironolactone (ALDACTONE) 25 MG tablet Take 1/2 (one-half) tablet by mouth once daily  . triamcinolone cream (KENALOG) 0.1 % APPLY CREAM TOPICALLY TO AFFECTED AREA(S) TWICE DAILY   No facility-administered encounter medications on file as of 03/16/2020.     Review of Systems  Constitutional: Negative for chills and fever.  HENT: Negative for congestion, rhinorrhea and sore throat.   Respiratory: Negative for cough, shortness of breath and wheezing.   Cardiovascular: Negative for chest pain and leg swelling.  Gastrointestinal: Negative for  abdominal pain, diarrhea, nausea and vomiting.  Genitourinary: Negative for dysuria and frequency.  Musculoskeletal: Negative for arthralgias and back pain.  Skin: Negative for rash.  Neurological: Negative for dizziness, weakness and  headaches.     Vitals BP (!) 168/80   Pulse 82   Temp 98.1 F (36.7 C)   Wt 185 lb (83.9 kg)   SpO2 98%   BMI 32.77 kg/m   Objective:   Physical Exam Vitals and nursing note reviewed.  Constitutional:      Appearance: Normal appearance.  HENT:     Head: Normocephalic and atraumatic.     Nose: Nose normal.     Mouth/Throat:     Mouth: Mucous membranes are moist.     Pharynx: Oropharynx is clear.  Eyes:     Extraocular Movements: Extraocular movements intact.     Conjunctiva/sclera: Conjunctivae normal.     Pupils: Pupils are equal, round, and reactive to light.  Cardiovascular:     Rate and Rhythm: Normal rate and regular rhythm.     Pulses: Normal pulses.     Heart sounds: Normal heart sounds.  Pulmonary:     Effort: Pulmonary effort is normal.     Breath sounds: Normal breath sounds. No wheezing, rhonchi or rales.  Musculoskeletal:        General: Normal range of motion.     Right lower leg: No edema.     Left lower leg: No edema.  Skin:    General: Skin is warm and dry.     Findings: No lesion or rash.  Neurological:     General: No focal deficit present.     Mental Status: She is alert and oriented to person, place, and time.     Comments: +using seated rolling walker to ambulate  Psychiatric:        Mood and Affect: Mood normal.        Behavior: Behavior normal.      Assessment and Plan   1. Medicare annual wellness visit, subsequent  2. Impaired fasting glucose - POCT glycosylated hemoglobin (Hb A1C)  3. Essential hypertension  4. Hypercalcemia    htn- uncontrolled.  Pt to cont meds. F/u with cards on recommendations on bp meds. Dec salt in diet.  Pt taking amlodipine-benazepril, chlorthalidone, clonidine, spironolactone, metoprolol.  Will recheck on next visit.  HM- 06/12/13- zostavax.  Had both covid vaccine moderna. Utd flu vaccine.   Impaired fasting glucose-stable, controlled. a1c at 5.7 today.  Cont diet  modification.  Hypercalcemia- will cont to monitor slight inc from last labs from 10.5 to 10.8.  Not having any symptoms.  No h/o excessive tums.  Pt taking daily calcium supplement.  F/u 14mo or prn.

## 2020-05-04 ENCOUNTER — Ambulatory Visit: Payer: Medicare HMO | Admitting: Family Medicine

## 2020-06-06 ENCOUNTER — Other Ambulatory Visit: Payer: Self-pay | Admitting: Cardiology

## 2020-06-10 ENCOUNTER — Other Ambulatory Visit: Payer: Self-pay | Admitting: Cardiology

## 2020-07-18 DIAGNOSIS — M25552 Pain in left hip: Secondary | ICD-10-CM | POA: Insufficient documentation

## 2020-08-25 ENCOUNTER — Other Ambulatory Visit: Payer: Self-pay | Admitting: Cardiology

## 2020-09-01 ENCOUNTER — Other Ambulatory Visit: Payer: Self-pay | Admitting: Family Medicine

## 2020-09-07 ENCOUNTER — Telehealth: Payer: Self-pay

## 2020-09-07 ENCOUNTER — Telehealth: Payer: Self-pay | Admitting: Family Medicine

## 2020-09-07 NOTE — Telephone Encounter (Signed)
Returning phone to [name]

## 2020-09-07 NOTE — Telephone Encounter (Signed)
Please see other pt message

## 2020-09-07 NOTE — Telephone Encounter (Signed)
Patient is requesting something for diarrhea called into [facility]

## 2020-09-07 NOTE — Telephone Encounter (Signed)
Left message to return call 

## 2020-09-07 NOTE — Telephone Encounter (Signed)
Dr Lovena Le patient please

## 2020-09-07 NOTE — Telephone Encounter (Signed)
Started slowly on Friday. Yesterday anything she ":put in her mouth it was bad." Starts 2 hrs after eating and stomach will begin to growl and she better head to bathroom." no mucus or blood in stool per patient.  No vomiting no nausea  Pt did buy some Imodium but after reading the back she is scared to take it. Please advise. Thank you

## 2020-09-07 NOTE — Telephone Encounter (Signed)
Please advise. Thank you

## 2020-09-08 NOTE — Telephone Encounter (Signed)
Pt contacted and states that diarrhea has subsided. Pt has appt with [place] on Tuesday

## 2020-09-08 NOTE — Telephone Encounter (Signed)
Pt needs appt.   Thx.   Dr. Zayne Marovich  

## 2020-09-13 ENCOUNTER — Other Ambulatory Visit: Payer: Self-pay

## 2020-09-13 ENCOUNTER — Ambulatory Visit (INDEPENDENT_AMBULATORY_CARE_PROVIDER_SITE_OTHER): Payer: Medicare HMO | Admitting: Family Medicine

## 2020-09-13 ENCOUNTER — Encounter: Payer: Self-pay | Admitting: Family Medicine

## 2020-09-13 VITALS — BP 138/70 | HR 87 | Temp 98.1°F | Ht 63.0 in | Wt 183.0 lb

## 2020-09-13 DIAGNOSIS — R7989 Other specified abnormal findings of blood chemistry: Secondary | ICD-10-CM | POA: Diagnosis not present

## 2020-09-13 DIAGNOSIS — I10 Essential (primary) hypertension: Secondary | ICD-10-CM | POA: Diagnosis not present

## 2020-09-13 DIAGNOSIS — R7301 Impaired fasting glucose: Secondary | ICD-10-CM | POA: Diagnosis not present

## 2020-09-13 NOTE — Progress Notes (Signed)
Patient ID: [name], female    DOB: [birth year], 80 y.o.   MRN: [id number]   Chief Complaint  Patient presents with  . Hypertension   Subjective:    HPI   Pt here for follow up on blood pressure. Pt states she has been doing well. No issues.   Had some diarrhea, lasted about 1 wk, resolved.  Then took imodium and afraid to take it. Resolved on it's own.  At home- 135-137/ 66-72.  Taking 1 per day calcium 600 IU. To prevent osteoporosis. No extra tums.  No other multivitamins.  Had eye exam in past year- no retina problems.  No other eye issues,   Using compression stockings daily, helping.  Seeing Dr. Harl Bowie 11/08/20 to discuss amlodipine/benazepril.   Used to see Dr. Arnoldo Morale to do colonoscopy. No blood or black stools.    Medical History Mashayla has a past medical history of Allergic rhinitis, Arthritis, Asthma, Hypertension, IFG (impaired fasting glucose), Osteopenia, and Spinal stenosis.   Outpatient Encounter Medications as of 09/13/2020  Medication Sig  . acetaminophen (TYLENOL) 325 MG tablet Take 650 mg by mouth every 6 (six) hours as needed.  Marland Kitchen albuterol (VENTOLIN HFA) 108 (90 Base) MCG/ACT inhaler INHALE TWO PUFFS BY MOUTH EVERY 6 HOURS AS NEEDED FOR WHEEZING  . amLODipine-benazepril (LOTREL) 10-40 MG capsule TAKE 1 CAPSULE DAILY  . aspirin EC 81 MG tablet Take 81 mg by mouth daily.  . Calcium Carbonate-Vit D-Min (CALCIUM 1200 PO) Take 1 tablet by mouth 2 (two) times daily.   . carboxymethylcellulose (REFRESH PLUS) 0.5 % SOLN Place 1 drop into both eyes 2 (two) times daily as needed. Dry eyes  . chlorthalidone (HYGROTON) 25 MG tablet TAKE ONE-HALF (1/2) TABLET DAILY  . cloNIDine (CATAPRES) 0.2 MG tablet TAKE ONE TABLET IN THE MORNING AND ONE-HALF TABLET IN THE EVENING (DOSE CHANGE)  . metoprolol tartrate (LOPRESSOR) 25 MG tablet TAKE ONE-HALF (1/2) TABLET TWICE A DAY (DOSE DECREASE)  . NON FORMULARY [facility]  Onychomycosis Nail Lacquer -   Fluconazole 2%, Terbinafine 1% DMSO Apply to affected nail once daily Qty. 120 gm 3 refills  . spironolactone (ALDACTONE) 25 MG tablet TAKE ONE-HALF (1/2) TABLET DAILY  . triamcinolone cream (KENALOG) 0.1 % APPLY CREAM TOPICALLY TO AFFECTED AREA(S) TWICE DAILY   No facility-administered encounter medications on file as of 09/13/2020.     Review of Systems  Constitutional: Negative for chills and fever.  HENT: Negative for congestion, rhinorrhea and sore throat.   Respiratory: Negative for cough, shortness of breath and wheezing.   Cardiovascular: Negative for chest pain and leg swelling.  Gastrointestinal: Negative for abdominal pain, diarrhea, nausea and vomiting.  Genitourinary: Negative for dysuria and frequency.  Musculoskeletal: Negative for arthralgias and back pain.  Skin: Negative for rash.  Neurological: Negative for dizziness, weakness and headaches.     Vitals BP 138/70   Pulse 87   Temp 98.1 F (36.7 C)   Ht 5' 3" (1.6 m)   Wt 183 lb (83 kg)   SpO2 97%   BMI 32.42 kg/m   Objective:   Physical Exam Vitals and nursing note reviewed.  Constitutional:      Appearance: Normal appearance.  HENT:     Head: Normocephalic and atraumatic.     Nose: Nose normal.     Mouth/Throat:     Mouth: Mucous membranes are moist.     Pharynx: Oropharynx is clear.  Eyes:     Extraocular Movements: Extraocular movements intact.  Conjunctiva/sclera: Conjunctivae normal.     Pupils: Pupils are equal, round, and reactive to light.  Cardiovascular:     Rate and Rhythm: Normal rate and regular rhythm.     Pulses: Normal pulses.     Heart sounds: Normal heart sounds.  Pulmonary:     Effort: Pulmonary effort is normal.     Breath sounds: Normal breath sounds. No wheezing, rhonchi or rales.  Musculoskeletal:        General: Normal range of motion.     Right lower leg: No edema.     Left lower leg: No edema.  Skin:    General: Skin is warm and dry.     Findings: No lesion  or rash.  Neurological:     General: No focal deficit present.     Mental Status: She is alert and oriented to person, place, and time.  Psychiatric:        Mood and Affect: Mood normal.        Behavior: Behavior normal.      Assessment and Plan   1. Hypertension, unspecified type - CBC - CMP14+EGFR - Lipid panel  2. Hypercalcemia - CMP14+EGFR  3. Impaired fasting glucose  4. Elevated serum creatinine   HM-discussed with pt the guidelines for screening colonoscopy. Usually not needing screening colonoscopy after 79 yrs old per the guidelines.  Not having blood or black stools currently or any new concerns.  Pt in agreement with plan.  Hypercalcemia-elevated last labs.  If calcium comes back elevated, may need to order pth.  Pt is taking calcium 600IU otc daily.  Elevated Cr- will recheck this visit.  htn- stable. Cont to monitor, cont meds. Pt getting refills on bp meds from Dr. Harl Bowie.  Return in about 6 months (around 03/16/2021) for f/u htn, glucose, calcium.

## 2020-09-14 LAB — CMP14+EGFR
ALT: 17 IU/L (ref 0–32)
AST: 26 IU/L (ref 0–40)
Albumin/Globulin Ratio: 1.6 (ref 1.2–2.2)
Albumin: 5 g/dL — ABNORMAL HIGH (ref 3.7–4.7)
Alkaline Phosphatase: 102 IU/L (ref 44–121)
BUN/Creatinine Ratio: 20 (ref 12–28)
BUN: 29 mg/dL — ABNORMAL HIGH (ref 8–27)
Bilirubin Total: 0.4 mg/dL (ref 0.0–1.2)
CO2: 21 mmol/L (ref 20–29)
Calcium: 10.8 mg/dL — ABNORMAL HIGH (ref 8.7–10.3)
Chloride: 103 mmol/L (ref 96–106)
Creatinine, Ser: 1.45 mg/dL — ABNORMAL HIGH (ref 0.57–1.00)
Globulin, Total: 3.2 g/dL (ref 1.5–4.5)
Glucose: 114 mg/dL — ABNORMAL HIGH (ref 65–99)
Potassium: 4.3 mmol/L (ref 3.5–5.2)
Sodium: 142 mmol/L (ref 134–144)
Total Protein: 8.2 g/dL (ref 6.0–8.5)
eGFR: 36 mL/min/{1.73_m2} — ABNORMAL LOW (ref >59)

## 2020-09-14 LAB — LIPID PANEL
Chol/HDL Ratio: 3.2 ratio (ref 0.0–4.4)
Cholesterol, Total: 188 mg/dL (ref 100–199)
HDL: 59 mg/dL (ref >39)
LDL Chol Calc (NIH): 108 mg/dL — ABNORMAL HIGH (ref 0–99)
Triglycerides: 121 mg/dL (ref 0–149)
VLDL Cholesterol Cal: 21 mg/dL (ref 5–40)

## 2020-09-14 LAB — CBC
Hematocrit: 40.9 % (ref 34.0–46.6)
Hemoglobin: 13.3 g/dL (ref 11.1–15.9)
MCH: 29 pg (ref 26.6–33.0)
MCHC: 32.5 g/dL (ref 31.5–35.7)
MCV: 89 fL (ref 79–97)
Platelets: 358 10*3/uL (ref 150–450)
RBC: 4.58 x10E6/uL (ref 3.77–5.28)
RDW: 12.2 % (ref 11.7–15.4)
WBC: 8.8 10*3/uL (ref 3.4–10.8)

## 2020-09-16 ENCOUNTER — Other Ambulatory Visit: Payer: Self-pay | Admitting: *Deleted

## 2020-09-16 DIAGNOSIS — R7989 Other specified abnormal findings of blood chemistry: Secondary | ICD-10-CM

## 2020-09-16 LAB — SPECIMEN STATUS REPORT

## 2020-09-20 ENCOUNTER — Other Ambulatory Visit: Payer: Self-pay

## 2020-09-20 LAB — PTH, INTACT AND CALCIUM
Calcium: 10.9 mg/dL — ABNORMAL HIGH (ref 8.7–10.3)
PTH: 56 pg/mL (ref 15–65)

## 2020-09-20 NOTE — Progress Notes (Signed)
Called patient , no answer will try back tomorrow

## 2020-09-21 ENCOUNTER — Encounter: Payer: Self-pay | Admitting: Family Medicine

## 2020-09-26 ENCOUNTER — Other Ambulatory Visit: Payer: Self-pay

## 2020-09-26 ENCOUNTER — Ambulatory Visit (INDEPENDENT_AMBULATORY_CARE_PROVIDER_SITE_OTHER): Payer: Medicare HMO | Admitting: Podiatry

## 2020-09-26 DIAGNOSIS — M79675 Pain in left toe(s): Secondary | ICD-10-CM

## 2020-09-26 DIAGNOSIS — L853 Xerosis cutis: Secondary | ICD-10-CM | POA: Diagnosis not present

## 2020-09-26 DIAGNOSIS — M79674 Pain in right toe(s): Secondary | ICD-10-CM | POA: Diagnosis not present

## 2020-09-26 DIAGNOSIS — B351 Tinea unguium: Secondary | ICD-10-CM

## 2020-09-26 MED ORDER — JUBLIA 10 % EX SOLN: CUTANEOUS | 6 refills | Status: DC

## 2020-09-26 NOTE — Patient Instructions (Signed)
   For extremely dry, cracked feet: moisturize feet once daily; do not apply between toes A. CeraVe Healing Ointment B. Aquaphor Healing Ointment C. Vaseline Petroleum Healing Jelly   If you have problems reaching your feet: apply to feet once daily; do not apply between toes A.  Aquaphor Advanced Therapy Ointment Body Spray B.  Vaseline Intensive Care Spray Lotion Advanced Repair   

## 2020-09-28 ENCOUNTER — Other Ambulatory Visit (HOSPITAL_COMMUNITY): Payer: Self-pay | Admitting: Nephrology

## 2020-09-28 ENCOUNTER — Other Ambulatory Visit: Payer: Self-pay | Admitting: Nephrology

## 2020-09-28 DIAGNOSIS — I129 Hypertensive chronic kidney disease with stage 1 through stage 4 chronic kidney disease, or unspecified chronic kidney disease: Secondary | ICD-10-CM

## 2020-09-28 DIAGNOSIS — N1832 Chronic kidney disease, stage 3b: Secondary | ICD-10-CM

## 2020-09-29 ENCOUNTER — Other Ambulatory Visit (INDEPENDENT_AMBULATORY_CARE_PROVIDER_SITE_OTHER): Payer: Medicare HMO | Admitting: Podiatry

## 2020-09-29 ENCOUNTER — Telehealth: Payer: Self-pay | Admitting: Podiatry

## 2020-09-29 DIAGNOSIS — B351 Tinea unguium: Secondary | ICD-10-CM

## 2020-09-29 DIAGNOSIS — M79674 Pain in right toe(s): Secondary | ICD-10-CM

## 2020-09-29 DIAGNOSIS — M79675 Pain in left toe(s): Secondary | ICD-10-CM

## 2020-09-29 MED ORDER — CICLOPIROX 8 % EX SOLN: CUTANEOUS | 11 refills | Status: DC

## 2020-09-29 NOTE — Telephone Encounter (Signed)
Patient calling to request replacement for Jublia. Patient states that insurance would not cover Jublia, and to call back to request an alternative. Please advise. Send to pharmacy on file.

## 2020-09-29 NOTE — Progress Notes (Signed)
Insurance does not cover Jublia topical antifungal. Discontinued prescription and sent new prescription for Ciclopirox Solution 8%.

## 2020-09-29 NOTE — Telephone Encounter (Signed)
New prescription sent in for Ciclopirox Solution 8% with 11 refills. Please inform patient. Thanks.

## 2020-10-01 ENCOUNTER — Encounter: Payer: Self-pay | Admitting: Podiatry

## 2020-10-01 LAB — VITAMIN D 1,25 DIHYDROXY
Vitamin D 1, 25 (OH)2 Total: 23 pg/mL
Vitamin D2 1, 25 (OH)2: <10 pg/mL
Vitamin D3 1, 25 (OH)2: 23 pg/mL

## 2020-10-01 LAB — PTH-RELATED PEPTIDE: PTH-related peptide: <2 pmol/L

## 2020-10-01 LAB — VITAMIN D 25 HYDROXY (VIT D DEFICIENCY, FRACTURES): Vit D, 25-Hydroxy: 25 ng/mL — ABNORMAL LOW (ref 30.0–100.0)

## 2020-10-01 NOTE — Progress Notes (Signed)
  Subjective:  Patient ID: [name], female    DOB: [birth year],  MRN: [id number]  [name] presents to clinic today for painful thick toenails that are difficult to trim. Pain interferes with ambulation. Aggravating factors include wearing enclosed shoe gear. Pain is relieved with periodic professional debridement.   Patient is interested in starting Jublia topical antfungal for her toenails.  She voices no new pedal concerns on today's visit.  Allergies  Allergen Reactions  . Levaquin [Levofloxacin In D5w]     Sleep disturbance  . Levofloxacin     Other reaction(s): GI Upset (intolerance) Sleep disturbance  . Tolterodine     Abdominal pain Other reaction(s): Other (See Comments) Abdominal pain Abdominal pain    Review of Systems: Negative except as noted in the HPI. Objective:   Constitutional [name] is a pleasant 80 y.o. African American female, WD, WN in NAD. AAO x 3.   Vascular Capillary refill time to digits immediate b/l. Palpable pedal pulses b/l LE. Pedal hair present. Lower extremity skin temperature gradient within normal limits. No pain with calf compression b/l. No cyanosis or clubbing noted.  Neurologic Normal speech. Oriented to person, place, and time. Protective sensation intact 5/5 intact bilaterally with 10g monofilament b/l.  Dermatologic Pedal skin with normal turgor, texture and tone bilaterally. No open wounds bilaterally. No interdigital macerations bilaterally. Toenails 1-5 b/l elongated, discolored, dystrophic, thickened, crumbly with subungual debris and tenderness to dorsal palpation. Pedal skin noted to be dry and flaky b/l lower extremities.  Orthopedic: Normal muscle strength 5/5 to all lower extremity muscle groups bilaterally. No pain crepitus or joint limitation noted with ROM b/l. No gross bony deformities bilaterally.   Radiographs: None Assessment:  No diagnosis found. Plan:  Patient was evaluated and treated and all  questions answered.  Onychomycosis with pain -Nails palliatively debridement as below -Educated on self-care  Procedure: Nail Debridement Rationale: Pain Type of Debridement: manual, sharp debridement. Instrumentation: Nail nipper, rotary burr. Number of Nails: 10 -Examined patient. -Patient to continue soft, supportive shoe gear daily. -Toenails 1-5 b/l were debrided in length and girth with sterile nail nippers and dremel without iatrogenic bleeding.  -Patient to report any pedal injuries to medical professional immediately. -For dry skin, patient was given written list of OTC moisturizers. Patient/POA instructed to apply to foot/feet once daily avoiding application between toes.  -Rx written for Jublia 10% topical solution. Apply one coat to each toenail once daily for 48 weeks. -Patient/POA to call should there be question/concern in the interim.  Return in about 3 months (around 12/27/2020).  Marzetta Board, DPM

## 2020-10-06 ENCOUNTER — Other Ambulatory Visit: Payer: Self-pay | Admitting: Cardiology

## 2020-10-12 ENCOUNTER — Other Ambulatory Visit: Payer: Self-pay

## 2020-10-12 ENCOUNTER — Ambulatory Visit (HOSPITAL_COMMUNITY)
Admission: RE | Admit: 2020-10-12 | Discharge: 2020-10-12 | Disposition: A | Discharge status: Home / Self Care | Payer: Medicare HMO | Source: Ambulatory Visit | Attending: Nephrology | Admitting: Nephrology

## 2020-10-12 DIAGNOSIS — N1832 Chronic kidney disease, stage 3b: Secondary | ICD-10-CM

## 2020-10-12 DIAGNOSIS — I129 Hypertensive chronic kidney disease with stage 1 through stage 4 chronic kidney disease, or unspecified chronic kidney disease: Secondary | ICD-10-CM | POA: Diagnosis present

## 2020-11-08 ENCOUNTER — Encounter: Payer: Self-pay | Admitting: Cardiology

## 2020-11-08 ENCOUNTER — Ambulatory Visit (INDEPENDENT_AMBULATORY_CARE_PROVIDER_SITE_OTHER): Payer: Medicare HMO | Admitting: Cardiology

## 2020-11-08 ENCOUNTER — Other Ambulatory Visit: Payer: Self-pay

## 2020-11-08 VITALS — BP 132/72 | HR 65 | Ht 63.0 in | Wt 183.7 lb

## 2020-11-08 DIAGNOSIS — R600 Localized edema: Secondary | ICD-10-CM | POA: Diagnosis not present

## 2020-11-08 DIAGNOSIS — I10 Essential (primary) hypertension: Secondary | ICD-10-CM

## 2020-11-08 NOTE — Patient Instructions (Signed)
Medication Instructions:  Your physician recommends that you continue on your current medications as directed. Please refer to the Current Medication list given to you today.  *If you need a refill on your cardiac medications before your next appointment, please call your pharmacy*   Lab Work: None If you have labs (blood work) drawn today and your tests are completely normal, you will receive your results only by: [place] (if you have MyChart) OR A paper copy in the mail If you have any lab test that is abnormal or we need to change your treatment, we will call you to review the results.   Testing/Procedures: None   Follow-Up: At [facility], you and your health needs are our priority.  As part of our continuing mission to provide you with exceptional heart care, we have created designated Provider Care Teams.  These Care Teams include your primary Cardiologist (physician) and Advanced Practice Providers (APPs -  Physician Assistants and Nurse Practitioners) who all work together to provide you with the care you need, when you need it.  We recommend signing up for the patient portal called "MyChart".  Sign up information is provided on this After Visit Summary.  MyChart is used to connect with patients for Virtual Visits (Telemedicine).  Patients are able to view lab/test results, encounter notes, upcoming appointments, etc.  Non-urgent messages can be sent to your provider as well.   To learn more about what you can do with MyChart, go to [email or web address].    Your next appointment:   12 month(s)  The format for your next appointment:   In Person  Provider:   Carlyle Dolly, MD   Other Instructions

## 2020-11-08 NOTE — Progress Notes (Signed)
Clinical Summary [name] is a 80 y.o.femaleseen today for follow up of the following medical problems.   1.HTN   - reports diagnosed at age 28, she states has always been difficult to control   - bp log 07/2014 with borderline low bp's and heart rates, lopressor decreased to 12.5mg  bid - history of white coat HTN, often clinic bp's elevated while home numbers at goal    - we had to lower her chlorthalidone previously due to AKI, she is now off   - 120s-130s/60s-70s at home.  - off chlorthalidone due to renal dysfunction    2.LE edema - started several years ago - echo LVEF 65-70%, grade I     - no recent issues, compliant with lasix.     3. Dynamic LVOT gradient - off beta blocker, unclear when stopped. No symptoms    4. CKD -followed by Dr Theador Hawthorne     [facility]: son previusly passed due to complications of diabetes   Past Medical History:  Diagnosis Date   Allergic rhinitis    Arthritis    Asthma    Hypertension    IFG (impaired fasting glucose)    Osteopenia    Spinal stenosis      Allergies  Allergen Reactions   Levaquin [Levofloxacin In D5w]     Sleep disturbance   Levofloxacin     Other reaction(s): GI Upset (intolerance) Sleep disturbance   Tolterodine     Abdominal pain Other reaction(s): Other (See Comments) Abdominal pain Abdominal pain     Current Outpatient Medications  Medication Sig Dispense Refill   acetaminophen (TYLENOL) 325 MG tablet Take 650 mg by mouth every 6 (six) hours as needed.     albuterol (VENTOLIN HFA) 108 (90 Base) MCG/ACT inhaler INHALE TWO PUFFS BY MOUTH EVERY 6 HOURS AS NEEDED FOR WHEEZING 18 each 5   amLODipine-benazepril (LOTREL) 10-40 MG capsule TAKE 1 CAPSULE DAILY 30 capsule 0   aspirin EC 81 MG tablet Take 81 mg by mouth daily.     Calcium Carb-Cholecalciferol (OYSTER SHELL CALCIUM) 500-400 MG-UNIT TABS Take by mouth.     Calcium Carbonate-Vit D-Min (CALCIUM 1200 PO) Take 1 tablet by mouth 2 (two) times  daily.      carboxymethylcellulose (REFRESH PLUS) 0.5 % SOLN Place 1 drop into both eyes 2 (two) times daily as needed. Dry eyes     cefdinir (OMNICEF) 300 MG capsule Take 300 mg by mouth 2 (two) times daily.     chlorthalidone (HYGROTON) 25 MG tablet TAKE ONE-HALF (1/2) TABLET DAILY 45 tablet 3   ciclopirox (PENLAC) 8 % solution Apply one coat to toenail qd for 48 weeks.  Remove weekly with polish remover. 6.6 mL 11   cloNIDine (CATAPRES) 0.2 MG tablet TAKE ONE TABLET IN THE MORNING AND ONE-HALF TABLET IN THE EVENING (DOSE CHANGE) 135 tablet 3   metoprolol tartrate (LOPRESSOR) 25 MG tablet TAKE ONE-HALF (1/2) TABLET TWICE A DAY (DOSE DECREASE) 90 tablet 1   NON FORMULARY [facility]  Onychomycosis Nail Lacquer -  Fluconazole 2%, Terbinafine 1% DMSO Apply to affected nail once daily Qty. 120 gm 3 refills     predniSONE (STERAPRED UNI-PAK 48 TAB) 5 MG (48) TBPK tablet Take by mouth as directed.     spironolactone (ALDACTONE) 25 MG tablet TAKE ONE-HALF (1/2) TABLET DAILY 45 tablet 3   triamcinolone cream (KENALOG) 0.1 % APPLY CREAM TOPICALLY TO AFFECTED AREA(S) TWICE DAILY 15 g 3   No current facility-administered medications for  this visit.     Past Surgical History:  Procedure Laterality Date   ABDOMINAL HYSTERECTOMY     BACK SURGERY     BREAST EXCISIONAL BIOPSY Left    benign   CARPAL TUNNEL RELEASE     CHOLECYSTECTOMY     KNEE ARTHROSCOPY Left    TOTAL ABDOMINAL HYSTERECTOMY W/ BILATERAL SALPINGOOPHORECTOMY       Allergies  Allergen Reactions   Levaquin [Levofloxacin In D5w]     Sleep disturbance   Levofloxacin     Other reaction(s): GI Upset (intolerance) Sleep disturbance   Tolterodine     Abdominal pain Other reaction(s): Other (See Comments) Abdominal pain Abdominal pain      Family History  Problem Relation Age of Onset   Diabetes Mother    Hypertension Mother    Diabetes Father    Hypertension Father      Social History [name] reports  that she has never smoked. She has never used smokeless tobacco. [name] has no history on file for alcohol use.   Review of Systems CONSTITUTIONAL: No weight loss, fever, chills, weakness or fatigue.  HEENT: Eyes: No visual loss, blurred vision, double vision or yellow sclerae.No hearing loss, sneezing, congestion, runny nose or sore throat.  SKIN: No rash or itching.  CARDIOVASCULAR: per hpi RESPIRATORY: No shortness of breath, cough or sputum.  GASTROINTESTINAL: No anorexia, nausea, vomiting or diarrhea. No abdominal pain or blood.  GENITOURINARY: No burning on urination, no polyuria NEUROLOGICAL: No headache, dizziness, syncope, paralysis, ataxia, numbness or tingling in the extremities. No change in bowel or bladder control.  MUSCULOSKELETAL: No muscle, back pain, joint pain or stiffness.  LYMPHATICS: No enlarged nodes. No history of splenectomy.  PSYCHIATRIC: No history of depression or anxiety.  ENDOCRINOLOGIC: No reports of sweating, cold or heat intolerance. No polyuria or polydipsia.  Marland Kitchen   Physical Examination Today's Vitals   11/08/20 0945  BP: 132/72  Pulse: 65  SpO2: 99%  Weight: 183 lb 11.2 oz (83.3 kg)  Height: 5\' 3"  (1.6 m)   Body mass index is 32.54 kg/m.  Gen: resting comfortably, no acute distress HEENT: no scleral icterus, pupils equal round and reactive, no palptable cervical adenopathy,  CV: RRR, no m/r/g, no jvd Resp: Clear to auscultation bilaterally GI: abdomen is soft, non-tender, non-distended, normal bowel sounds, no hepatosplenomegaly MSK: extremities are warm, no edema.  Skin: warm, no rash Neuro:  no focal deficits Psych: appropriate affect   Diagnostic Studies 01/2014 echo Study Conclusions  - Left ventricle: The cavity size was normal. Wall thickness was   increased in a pattern of mild LVH. Systolic function was   vigorous. The estimated ejection fraction was in the range of 65%   to 70%. There was dynamic obstruction during  Valsalva in the mid   cavity caused by vigorous LV systolic function, with a peak   velocity of 393 cm/sec and a peak gradient of 62 mm Hg. Doppler   parameters are consistent with abnormal left ventricular   relaxation (grade 1 diastolic dysfunction). - Left atrium: The atrium was mildly dilated. - Pulmonary arteries: PA peak pressure: 39 mm Hg (S). PASP is   borderline elevated. - Systemic veins: The IVC is small, suggestive of low RA pressure   and hypovolemia. - Technically adequate study.    Assessment and Plan   1. HTN   - essentially at goal, medical therapy limited by renal dysfunction - continue current meds   2. LE edema -overall controlled, continue  compression stockings and lasix  EKG today shows NSR  F/u 1 year     Arnoldo Lenis, M.D.

## 2020-11-21 ENCOUNTER — Telehealth: Payer: Self-pay | Admitting: Family Medicine

## 2020-11-21 ENCOUNTER — Other Ambulatory Visit: Payer: Self-pay | Admitting: Family Medicine

## 2020-11-21 MED ORDER — ALBUTEROL SULFATE HFA 108 (90 BASE) MCG/ACT IN AERS: INHALATION_SPRAY | RESPIRATORY_TRACT | 3 refills | Status: DC

## 2020-11-21 NOTE — Telephone Encounter (Signed)
Called pt because last time albuterol was sent in was 2018. She states she uses about 8 puffs a year and needs a refill. Uses from time to time when outside in the heat. States no trouble breathing now. Just needed to have one on hand because hers expired.  [facility]. Last med check up was 09/13/20

## 2020-11-21 NOTE — Telephone Encounter (Signed)
Pt.notified

## 2020-11-21 NOTE — Telephone Encounter (Signed)
Patient is requesting prescription for albuterol inhaler called into [facility]. She was last seen 09/13/2020 has been trying to get this filled.

## 2021-01-02 ENCOUNTER — Ambulatory Visit: Payer: Medicare HMO | Admitting: Podiatry

## 2021-01-10 ENCOUNTER — Telehealth: Payer: Self-pay

## 2021-01-10 ENCOUNTER — Telehealth: Payer: Medicare HMO | Admitting: Family Medicine

## 2021-01-10 ENCOUNTER — Ambulatory Visit: Payer: Medicare HMO | Admitting: Family Medicine

## 2021-01-10 ENCOUNTER — Ambulatory Visit (INDEPENDENT_AMBULATORY_CARE_PROVIDER_SITE_OTHER): Payer: Medicare HMO | Admitting: Family Medicine

## 2021-01-10 ENCOUNTER — Other Ambulatory Visit: Payer: Self-pay

## 2021-01-10 VITALS — BP 120/58 | Ht 63.0 in | Wt 183.0 lb

## 2021-01-10 DIAGNOSIS — L649 Androgenic alopecia, unspecified: Secondary | ICD-10-CM | POA: Diagnosis not present

## 2021-01-10 DIAGNOSIS — L680 Hirsutism: Secondary | ICD-10-CM | POA: Diagnosis not present

## 2021-01-10 MED ORDER — MINOXIDIL FOR WOMEN 2 % EX SOLN: Freq: Two times a day (BID) | CUTANEOUS | 1 refills | Status: DC

## 2021-01-10 NOTE — Telephone Encounter (Signed)
I connected with  [name] on 01/10/21 by a video enabled telemedicine application and verified that I am speaking with the correct person using two identifiers.   I discussed the limitations of evaluation and management by telemedicine. The patient expressed understanding and agreed to proceed.

## 2021-01-10 NOTE — Progress Notes (Signed)
I connected with  [name] on 01/10/21 by a phone enabled telemedicine application and verified that I am speaking with the correct person using two identifiers.   I discussed the limitations of evaluation and management by telemedicine. The patient expressed understanding and agreed to proceed.  Patient location: home  Provider location: in office  I provided 13 minutes of non face - to - face time during this encounter.    Patient ID: [name], female    DOB: December 04, 1940, 80 y.o.   MRN: [id number]   Chief Complaint  Patient presents with   Alopecia    Going on for about a year now    facial hair   Subjective:    HPI Pt having some concerns- with losing her hair on scalp.  Going on for a year.  Looked through the hair with the last pcp and said she was "fine." Losing hair in female pattern baldness. Hairline is receding and also hair missing in center of top of scalp. Has not tried any topical medication for it.  Pt is on spironolactone '25mg'$ , taking 1/2-1 tab daily for cardiology for hypertension.  Pt having some facial hair and having to shave daily on her chin/beard area. Not on any new hormone therapies.  Medical History [place] has a past medical history of Allergic rhinitis, Arthritis, Asthma, Hypertension, IFG (impaired fasting glucose), Osteopenia, and Spinal stenosis.   Outpatient Encounter Medications as of 01/10/2021  Medication Sig   acetaminophen (TYLENOL) 325 MG tablet Take 650 mg by mouth every 6 (six) hours as needed.   albuterol (VENTOLIN HFA) 108 (90 Base) MCG/ACT inhaler INHALE TWO PUFFS BY MOUTH EVERY 6 HOURS AS NEEDED FOR WHEEZING   amLODipine-benazepril (LOTREL) 10-40 MG capsule TAKE 1 CAPSULE DAILY   aspirin EC 81 MG tablet Take 81 mg by mouth daily.   carboxymethylcellulose (REFRESH PLUS) 0.5 % SOLN Place 1 drop into both eyes 2 (two) times daily as needed. Dry eyes   ciclopirox (PENLAC) 8 % solution Apply one coat to toenail qd for 48  weeks.  Remove weekly with polish remover.   cloNIDine (CATAPRES) 0.2 MG tablet TAKE ONE TABLET IN THE MORNING AND ONE-HALF TABLET IN THE EVENING (DOSE CHANGE)   furosemide (LASIX) 20 MG tablet Take 20 mg by mouth 2 (two) times daily.   Investigational vitamin D 600 UNITS capsule SWOG S0812 Take 600 Units by mouth daily. Take with food.   metoprolol tartrate (LOPRESSOR) 25 MG tablet TAKE ONE-HALF (1/2) TABLET TWICE A DAY (DOSE DECREASE)   minoxidil (MINOXIDIL FOR WOMEN) 2 % external solution Apply topically 2 (two) times daily. Apply to scalp and allow to dry, and rinse off hands.   NON FORMULARY [facility]  Onychomycosis Nail Lacquer -  Fluconazole 2%, Terbinafine 1% DMSO Apply to affected nail once daily Qty. 120 gm 3 refills   spironolactone (ALDACTONE) 25 MG tablet TAKE ONE-HALF (1/2) TABLET DAILY   triamcinolone cream (KENALOG) 0.1 % APPLY CREAM TOPICALLY TO AFFECTED AREA(S) TWICE DAILY   [DISCONTINUED] Calcium Carbonate-Vit D-Min (CALCIUM 1200 PO) Take 1 tablet by mouth 2 (two) times daily.    [DISCONTINUED] cefdinir (OMNICEF) 300 MG capsule Take 300 mg by mouth 2 (two) times daily.   [DISCONTINUED] predniSONE (STERAPRED UNI-PAK 48 TAB) 5 MG (48) TBPK tablet Take by mouth as directed.   No facility-administered encounter medications on file as of 01/10/2021.     Review of Systems  Constitutional:  Negative for chills and fever.  HENT:  Negative  for congestion, rhinorrhea and sore throat.   Respiratory:  Negative for cough, shortness of breath and wheezing.   Cardiovascular:  Negative for chest pain and leg swelling.  Gastrointestinal:  Negative for abdominal pain, diarrhea, nausea and vomiting.  Endocrine:       +inc hair growth on face. +balding -female pattern on scalp  Genitourinary:  Negative for dysuria and frequency.  Musculoskeletal:  Negative for arthralgias and back pain.  Skin:  Negative for rash.  Neurological:  Negative for dizziness, weakness and headaches.     Vitals BP (!) 120/58 Comment: pt reported home reading  Ht '5\' 3"'$  (1.6 m)   Wt 183 lb (83 kg)   BMI 32.42 kg/m   Objective:   Physical Exam No PE due to phone visit.   Assessment and Plan   1. Hirsutism - TSH - CBC - Iron - Ferritin - Iron Binding Cap (TIBC)([facility]) - Testosterone,Free and Total - DHEA  2. Female pattern baldness - TSH - CBC - Iron - Ferritin - Iron Binding Cap (TIBC)([facility]) - minoxidil (MINOXIDIL FOR WOMEN) 2 % external solution; Apply topically 2 (two) times daily. Apply to scalp and allow to dry, and rinse off hands.  Dispense: 60 mL; Refill: 1 - Testosterone,Free and Total - DHEA   Will check - cbc, Tsh, iron, ferritin, testosterone, and DHEA. Pt to try a trial of minoxidil on scalp to see if will help with the balding. Also will work up the hirsutism. Pt is on low dose spironolactone, but for tx of hirsutism would need a higher dose.    Return in about 3 months (around 04/12/2021) for hirsutism, balding.

## 2021-02-15 ENCOUNTER — Other Ambulatory Visit: Payer: Self-pay | Admitting: Cardiology

## 2021-02-20 ENCOUNTER — Other Ambulatory Visit (HOSPITAL_COMMUNITY): Payer: Self-pay | Admitting: Family Medicine

## 2021-02-20 DIAGNOSIS — Z1231 Encounter for screening mammogram for malignant neoplasm of breast: Secondary | ICD-10-CM

## 2021-02-23 ENCOUNTER — Ambulatory Visit (HOSPITAL_COMMUNITY): Payer: Medicare HMO

## 2021-03-02 ENCOUNTER — Ambulatory Visit (HOSPITAL_COMMUNITY)
Admission: RE | Admit: 2021-03-02 | Discharge: 2021-03-02 | Disposition: A | Discharge status: Home / Self Care | Payer: Medicare HMO | Source: Ambulatory Visit | Attending: Family Medicine | Admitting: Family Medicine

## 2021-03-02 ENCOUNTER — Other Ambulatory Visit: Payer: Self-pay

## 2021-03-02 DIAGNOSIS — Z1231 Encounter for screening mammogram for malignant neoplasm of breast: Secondary | ICD-10-CM | POA: Diagnosis present

## 2021-04-04 ENCOUNTER — Other Ambulatory Visit: Payer: Self-pay | Admitting: Cardiology

## 2021-04-25 ENCOUNTER — Telehealth: Payer: Self-pay

## 2021-04-25 ENCOUNTER — Other Ambulatory Visit: Payer: Self-pay

## 2021-04-25 ENCOUNTER — Ambulatory Visit (INDEPENDENT_AMBULATORY_CARE_PROVIDER_SITE_OTHER): Payer: Medicare HMO

## 2021-04-25 VITALS — BP 142/86 | HR 68 | Ht 63.0 in | Wt 179.6 lb

## 2021-04-25 DIAGNOSIS — Z0000 Encounter for general adult medical examination without abnormal findings: Secondary | ICD-10-CM | POA: Diagnosis not present

## 2021-04-25 NOTE — Patient Instructions (Signed)
[name] , Thank you for taking time to come for your Medicare Wellness Visit. I appreciate your ongoing commitment to your health goals. Please review the following plan we discussed and let me know if I can assist you in the future.   Screening recommendations/referrals: Colonoscopy: Done 01/05/2010. No longer required due to age. Mammogram: Done 03/02/2021 Repeat annually  Bone Density: Done 01/05/2010. Pt declines repeat at this time.  Recommended yearly ophthalmology/optometry visit for glaucoma screening and checkup Recommended yearly dental visit for hygiene and checkup  Vaccinations: Influenza vaccine: Done 02/25/2021 Pneumococcal vaccine: Done 05/14/2010 and 02/21/2015 Tdap vaccine: Due Repeat in 10 years  Shingles vaccine: Shingrix discussed. Please contact your pharmacy for coverage information.     Covid-19:Done 05/26/2019, 07/06/2019, 03/19/2020 and 03/30/2021.  Advanced directives: Advance directive discussed with you today. Even though you declined this today, please call our office should you change your mind, and we can give you the proper paperwork for you to fill out.   Conditions/risks identified: Aim for 30 minutes of exercise or brisk walking each day, drink 6-8 glasses of water and eat lots of fruits and vegetables.   Next appointment: Follow up in one year for your annual wellness visit 2023.   Preventive Care 3 Years and Older, Female Preventive care refers to lifestyle choices and visits with your health care provider that can promote health and wellness. What does preventive care include? A yearly physical exam. This is also called an annual well check. Dental exams once or twice a year. Routine eye exams. Ask your health care provider how often you should have your eyes checked. Personal lifestyle choices, including: Daily care of your teeth and gums. Regular physical activity. Eating a healthy diet. Avoiding tobacco and drug use. Limiting alcohol  use. Practicing safe sex. Taking low-dose aspirin every day. Taking vitamin and mineral supplements as recommended by your health care provider. What happens during an annual well check? The services and screenings done by your health care provider during your annual well check will depend on your age, overall health, lifestyle risk factors, and family history of disease. Counseling  Your health care provider may ask you questions about your: Alcohol use. Tobacco use. Drug use. Emotional well-being. Home and relationship well-being. Sexual activity. Eating habits. History of falls. Memory and ability to understand (cognition). Work and work Statistician. Reproductive health. Screening  You may have the following tests or measurements: Height, weight, and BMI. Blood pressure. Lipid and cholesterol levels. These may be checked every 5 years, or more frequently if you are over 42 years old. Skin check. Lung cancer screening. You may have this screening every year starting at age 29 if you have a 30-pack-year history of smoking and currently smoke or have quit within the past 15 years. Fecal occult blood test (FOBT) of the stool. You may have this test every year starting at age 71. Flexible sigmoidoscopy or colonoscopy. You may have a sigmoidoscopy every 5 years or a colonoscopy every 10 years starting at age 43. Hepatitis C blood test. Hepatitis B blood test. Sexually transmitted disease (STD) testing. Diabetes screening. This is done by checking your blood sugar (glucose) after you have not eaten for a while (fasting). You may have this done every 1-3 years. Bone density scan. This is done to screen for osteoporosis. You may have this done starting at age 78. Mammogram. This may be done every 1-2 years. Talk to your health care provider about how often you should have regular mammograms.  Talk with your health care provider about your test results, treatment options, and if necessary,  the need for more tests. Vaccines  Your health care provider may recommend certain vaccines, such as: Influenza vaccine. This is recommended every year. Tetanus, diphtheria, and acellular pertussis (Tdap, Td) vaccine. You may need a Td booster every 10 years. Zoster vaccine. You may need this after age 48. Pneumococcal 13-valent conjugate (PCV13) vaccine. One dose is recommended after age 11. Pneumococcal polysaccharide (PPSV23) vaccine. One dose is recommended after age 10. Talk to your health care provider about which screenings and vaccines you need and how often you need them. This information is not intended to replace advice given to you by your health care provider. Make sure you discuss any questions you have with your health care provider. Document Released: 05/27/2015 Document Revised: 01/18/2016 Document Reviewed: 03/01/2015 Elsevier Interactive Patient Education  2017 [place] Prevention in the Home Falls can cause injuries. They can happen to people of all ages. There are many things you can do to make your home safe and to help prevent falls. What can I do on the outside of my home? Regularly fix the edges of walkways and driveways and fix any cracks. Remove anything that might make you trip as you walk through a door, such as a raised step or threshold. Trim any bushes or trees on the path to your home. Use bright outdoor lighting. Clear any walking paths of anything that might make someone trip, such as rocks or tools. Regularly check to see if handrails are loose or broken. Make sure that both sides of any steps have handrails. Any raised decks and porches should have guardrails on the edges. Have any leaves, snow, or ice cleared regularly. Use sand or salt on walking paths during winter. Clean up any spills in your garage right away. This includes oil or grease spills. What can I do in the bathroom? Use night lights. Install grab bars by the toilet and in the  tub and shower. Do not use towel bars as grab bars. Use non-skid mats or decals in the tub or shower. If you need to sit down in the shower, use a plastic, non-slip stool. Keep the floor dry. Clean up any water that spills on the floor as soon as it happens. Remove soap buildup in the tub or shower regularly. Attach bath mats securely with double-sided non-slip rug tape. Do not have throw rugs and other things on the floor that can make you trip. What can I do in the bedroom? Use night lights. Make sure that you have a light by your bed that is easy to reach. Do not use any sheets or blankets that are too big for your bed. They should not hang down onto the floor. Have a firm chair that has side arms. You can use this for support while you get dressed. Do not have throw rugs and other things on the floor that can make you trip. What can I do in the kitchen? Clean up any spills right away. Avoid walking on wet floors. Keep items that you use a lot in easy-to-reach places. If you need to reach something above you, use a strong step stool that has a grab bar. Keep electrical cords out of the way. Do not use floor polish or wax that makes floors slippery. If you must use wax, use non-skid floor wax. Do not have throw rugs and other things on the floor that can make  you trip. What can I do with my stairs? Do not leave any items on the stairs. Make sure that there are handrails on both sides of the stairs and use them. Fix handrails that are broken or loose. Make sure that handrails are as long as the stairways. Check any carpeting to make sure that it is firmly attached to the stairs. Fix any carpet that is loose or worn. Avoid having throw rugs at the top or bottom of the stairs. If you do have throw rugs, attach them to the floor with carpet tape. Make sure that you have a light switch at the top of the stairs and the bottom of the stairs. If you do not have them, ask someone to add them for  you. What else can I do to help prevent falls? Wear shoes that: Do not have high heels. Have rubber bottoms. Are comfortable and fit you well. Are closed at the toe. Do not wear sandals. If you use a stepladder: Make sure that it is fully opened. Do not climb a closed stepladder. Make sure that both sides of the stepladder are locked into place. Ask someone to hold it for you, if possible. Clearly mark and make sure that you can see: Any grab bars or handrails. First and last steps. Where the edge of each step is. Use tools that help you move around (mobility aids) if they are needed. These include: Canes. Walkers. Scooters. Crutches. Turn on the lights when you go into a dark area. Replace any light bulbs as soon as they burn out. Set up your furniture so you have a clear path. Avoid moving your furniture around. If any of your floors are uneven, fix them. If there are any pets around you, be aware of where they are. Review your medicines with your doctor. Some medicines can make you feel dizzy. This can increase your chance of falling. Ask your doctor what other things that you can do to help prevent falls. This information is not intended to replace advice given to you by your health care provider. Make sure you discuss any questions you have with your health care provider. Document Released: 02/24/2009 Document Revised: 10/06/2015 Document Reviewed: 06/04/2014 Elsevier Interactive Patient Education  2017 [name].

## 2021-04-25 NOTE — Progress Notes (Signed)
Subjective:   [name] is a 80 y.o. female who presents for Medicare Annual (Subsequent) preventive examination. Virtual Visit via Telephone Note  I connected with  [name] on 04/25/21 at  2:20 PM EST by telephone and verified that I am speaking with the correct person using two identifiers.   Chriss Driver, LPN  Review of Systems     Cardiac Risk Factors include: advanced age (>31men, >13 women);hypertension;sedentary lifestyle;obesity (BMI >30kg/m2)     Objective:    Today's Vitals   04/25/21 1430  BP: (!) 142/86  Pulse: 68  SpO2: 99%  Weight: 179 lb 9.6 oz (81.5 kg)  Height: 5\' 3"  (1.6 m)   Body mass index is 31.81 kg/m.  Advanced Directives 04/25/2021  Does Patient Have a Medical Advance Directive? No  Would patient like information on creating a medical advance directive? No - Patient declined    Current Medications (verified) Outpatient Encounter Medications as of 04/25/2021  Medication Sig   acetaminophen (TYLENOL) 325 MG tablet Take 650 mg by mouth every 6 (six) hours as needed.   albuterol (VENTOLIN HFA) 108 (90 Base) MCG/ACT inhaler INHALE TWO PUFFS BY MOUTH EVERY 6 HOURS AS NEEDED FOR WHEEZING   amLODipine (NORVASC) 5 MG tablet Take 5 mg by mouth daily.   aspirin EC 81 MG tablet Take 81 mg by mouth daily.   carboxymethylcellulose (REFRESH PLUS) 0.5 % SOLN Place 1 drop into both eyes 2 (two) times daily as needed. Dry eyes   cloNIDine (CATAPRES) 0.2 MG tablet TAKE ONE TABLET IN THE MORNING AND ONE-HALF TABLET IN THE EVENING (DOSE CHANGE)   furosemide (LASIX) 20 MG tablet Take 20 mg by mouth 2 (two) times daily.   lisinopril (ZESTRIL) 5 MG tablet Take 5 mg by mouth daily.   metoprolol tartrate (LOPRESSOR) 25 MG tablet TAKE ONE-HALF (1/2) TABLET TWICE A DAY (DOSE DECREASE)   minoxidil (MINOXIDIL FOR WOMEN) 2 % external solution Apply topically 2 (two) times daily. Apply to scalp and allow to dry, and rinse off hands.   NON FORMULARY  [facility]  Onychomycosis Nail Lacquer -  Fluconazole 2%, Terbinafine 1% DMSO Apply to affected nail once daily Qty. 120 gm 3 refills   spironolactone (ALDACTONE) 25 MG tablet TAKE ONE-HALF (1/2) TABLET DAILY   triamcinolone cream (KENALOG) 0.1 % APPLY CREAM TOPICALLY TO AFFECTED AREA(S) TWICE DAILY   amLODipine-benazepril (LOTREL) 10-40 MG capsule TAKE 1 CAPSULE DAILY (NEED FOLLOW UP APPOINTMENT WITH CARDIOLOGY FOR FURTHER REFILLS) (Patient not taking: Reported on 04/25/2021)   ciclopirox (PENLAC) 8 % solution Apply one coat to toenail qd for 48 weeks.  Remove weekly with polish remover. (Patient not taking: Reported on 04/25/2021)   cloNIDine (CATAPRES) 0.1 MG tablet Take 0.1 mg by mouth 2 (two) times daily. (Patient not taking: Reported on 04/25/2021)   Investigational vitamin D 600 UNITS capsule SWOG S0812 Take 600 Units by mouth daily. Take with food. (Patient not taking: Reported on 04/25/2021)   No facility-administered encounter medications on file as of 04/25/2021.    Allergies (verified) Levaquin [levofloxacin in d5w], Levofloxacin, and Tolterodine   History: Past Medical History:  Diagnosis Date   Allergic rhinitis    Arthritis    Asthma    Hypertension    IFG (impaired fasting glucose)    Osteopenia    Spinal stenosis    Past Surgical History:  Procedure Laterality Date   ABDOMINAL HYSTERECTOMY     BACK SURGERY     BREAST EXCISIONAL  BIOPSY Left    benign   CARPAL TUNNEL RELEASE     CHOLECYSTECTOMY     KNEE ARTHROSCOPY Left    TOTAL ABDOMINAL HYSTERECTOMY W/ BILATERAL SALPINGOOPHORECTOMY     Family History  Problem Relation Age of Onset   Diabetes Mother    Hypertension Mother    Diabetes Father    Hypertension Father    Social History   Socioeconomic History   Marital status: Married    Spouse name: Not on file   Number of children: 4   Years of education: Not on file   Highest education level: Not on file  Occupational History   Not on file   Tobacco Use   Smoking status: Never   Smokeless tobacco: Never  Vaping Use   Vaping Use: Never used  Substance and Sexual Activity   Alcohol use: Not on file   Drug use: Never   Sexual activity: Not on file  Other Topics Concern   Not on file  Social History Narrative   Married x 56 years in 2020/08/20.   1 son deceased at age 5.   58 living children.   Social Determinants of Health   Financial Resource Strain: Low Risk    Difficulty of Paying Living Expenses: Not hard at all  Food Insecurity: No Food Insecurity   Worried About Charity fundraiser in the Last Year: Never true   [place] in the Last Year: Never true  Transportation Needs: No Transportation Needs   Lack of Transportation (Medical): No   Lack of Transportation (Non-Medical): No  Physical Activity: Sufficiently Active   Days of Exercise per Week: 5 days   Minutes of Exercise per Session: 30 min  Stress: No Stress Concern Present   Feeling of Stress : Not at all  Social Connections: Moderately Isolated   Frequency of Communication with Friends and Family: More than three times a week   Frequency of Social Gatherings with Friends and Family: More than three times a week   Attends Religious Services: Never   Marine scientist or Organizations: No   Attends Music therapist: Never   Marital Status: Married    Tobacco Counseling Counseling given: Not Answered   Clinical Intake:  Pre-visit preparation completed: Yes  Pain : No/denies pain     BMI - recorded: 31.81 Nutritional Status: BMI > 30  Obese Nutritional Risks: None Diabetes: No  How often do you need to have someone help you when you read instructions, pamphlets, or other written materials from your doctor or pharmacy?: 1 - Never  Diabetic?No  Interpreter Needed?: No  Information entered by :: MJ Nabor Thomann, LPN   Activities of Daily Living In your present state of health, do you have any difficulty performing the  following activities: 04/25/2021  Hearing? N  Vision? N  Difficulty concentrating or making decisions? N  Walking or climbing stairs? N  Dressing or bathing? N  Doing errands, shopping? N  Preparing Food and eating ? N  Using the Toilet? N  In the past six months, have you accidently leaked urine? Y  Do you have problems with loss of bowel control? N  Managing your Medications? N  Managing your Finances? N  Housekeeping or managing your Housekeeping? N  Some recent data might be hidden    Patient Care Team: Coral Spikes, DO as PCP - General (Family Medicine) Harl Bowie Alphonse Guild, MD as PCP - Cardiology (Cardiology) Harl Bowie Alphonse Guild, MD  as Consulting Physician (Cardiology)  Indicate any recent Medical Services you may have received from other than [facility] providers in the past year (date may be approximate).     Assessment:   This is a routine wellness examination for [name].  Hearing/Vision screen Hearing Screening - Comments:: Some hearing issues and ringing in ears.  Vision Screening - Comments:: Glasses. Dr. Gershon Crane in [place]. 04/18/2021.  Dietary issues and exercise activities discussed: Current Exercise Habits: Home exercise routine, Type of exercise: walking, Time (Minutes): 25, Frequency (Times/Week): 5, Weekly Exercise (Minutes/Week): 125, Intensity: Mild, Exercise limited by: cardiac condition(s)   Goals Addressed             This Visit's Progress    Exercise 3x per week (30 min per time)       Would like to continue to exercise and stay healthy. Would like to move to [place].       Depression Screen PHQ 2/9 Scores 04/25/2021 01/10/2021 09/13/2020 03/16/2020 08/22/2017  PHQ - 2 Score 0 0 0 0 0    Fall Risk Fall Risk  04/25/2021 01/10/2021 09/13/2020 03/16/2020  Falls in the past year? 0 0 0 0  Number falls in past yr: 0 0 0 -  Injury with Fall? 0 0 0 -  Risk for fall due to : Impaired balance/gait;Impaired mobility No Fall Risks No Fall Risks -  Follow up  Falls prevention discussed Falls evaluation completed Falls evaluation completed Falls evaluation completed    [place]:  Any stairs in or around the home? Yes  If so, are there any without handrails? No  Home free of loose throw rugs in walkways, pet beds, electrical cords, etc? Yes  Adequate lighting in your home to reduce risk of falls? Yes   ASSISTIVE DEVICES UTILIZED TO PREVENT FALLS:  Life alert? Yes  Use of a cane, walker or w/c? Yes  Grab bars in the bathroom? Yes  Shower chair or bench in shower? Yes  Elevated toilet seat or a handicapped toilet? No   TIMED UP AND GO:  Was the test performed? Yes .  Length of time to ambulate 10 feet: 12 sec.   Gait slow and steady without use of assistive device  Cognitive Function:     6CIT Screen 04/25/2021  What Year? 0 points  What month? 0 points  What time? 0 points  Count back from 20 0 points  Months in reverse 0 points  Repeat phrase 0 points  Total Score 0    Immunizations Immunization History  Administered Date(s) Administered   Fluad Quad(high Dose 65+) 01/27/2019   Influenza Split 02/24/2013   Influenza,inj,Quad PF,6+ Mos 02/02/2014, 02/21/2015, 02/14/2016, 02/21/2017, 02/19/2018   Influenza-Unspecified 02/12/2011, 01/26/2015, 02/25/2021   Moderna Covid-19 Vaccine Bivalent Booster 57yrs & up 03/30/2021   Moderna SARS-COV2 Booster Vaccination 03/19/2020   Moderna Sars-Covid-2 Vaccination 05/26/2019, 07/06/2019   Pneumococcal Conjugate-13 02/21/2015   Pneumococcal Polysaccharide-23 02/12/2003, 05/14/2010   Zoster, Live 06/12/2013    TDAP status: Due, Education has been provided regarding the importance of this vaccine. Advised may receive this vaccine at local pharmacy or Health Dept. Aware to provide a copy of the vaccination record if obtained from local pharmacy or Health Dept. Verbalized acceptance and understanding.  Flu Vaccine status: Up to date  Pneumococcal  vaccine status: Up to date  Covid-19 vaccine status: Completed vaccines  Qualifies for Shingles Vaccine? Yes   Zostavax completed No   Shingrix Completed?: No.    Education  has been provided regarding the importance of this vaccine. Patient has been advised to call insurance company to determine out of pocket expense if they have not yet received this vaccine. Advised may also receive vaccine at local pharmacy or Health Dept. Verbalized acceptance and understanding.  Screening Tests Health Maintenance  Topic Date Due   FOOT EXAM  Never done   OPHTHALMOLOGY EXAM  Never done   TETANUS/TDAP  Never done   Zoster Vaccines- Shingrix (1 of 2) Never done   HEMOGLOBIN A1C  09/13/2020   COVID-19 Vaccine (3 - Moderna risk series) 03/30/2021   Pneumonia Vaccine 63+ Years old  Completed   INFLUENZA VACCINE  Completed   DEXA SCAN  Completed   HPV VACCINES  Aged Out    Health Maintenance  Health Maintenance Due  Topic Date Due   FOOT EXAM  Never done   OPHTHALMOLOGY EXAM  Never done   TETANUS/TDAP  Never done   Zoster Vaccines- Shingrix (1 of 2) Never done   HEMOGLOBIN A1C  09/13/2020   COVID-19 Vaccine (3 - Moderna risk series) 03/30/2021    Colorectal cancer screening: No longer required.   Mammogram status: Completed 03/02/2021. Repeat every year  Bone Density status: Completed 01/05/2010. Results reflect: Bone density results: OSTEOPENIA. Repeat every 2 years.  Lung Cancer Screening: (Low Dose CT Chest recommended if Age 18-80 years, 30 pack-year currently smoking OR have quit w/in 15years.) does not qualify.  Additional Screening:  Hepatitis C Screening: does not qualify  Vision Screening: Recommended annual ophthalmology exams for early detection of glaucoma and other disorders of the eye. Is the patient up to date with their annual eye exam?  Yes  Who is the provider or what is the name of the office in which the patient attends annual eye exams? Dr. Gershon Crane If pt is not  established with a provider, would they like to be referred to a provider to establish care? No .   Dental Screening: Recommended annual dental exams for proper oral hygiene  Community Resource Referral / Chronic Care Management: CRR required this visit?  No   CCM required this visit?  No      Plan:     I have personally reviewed and noted the following in the patients chart:   Medical and social history Use of alcohol, tobacco or illicit drugs  Current medications and supplements including opioid prescriptions.  Functional ability and status Nutritional status Physical activity Advanced directives List of other physicians Hospitalizations, surgeries, and ER visits in previous 12 months Vitals Screenings to include cognitive, depression, and falls Referrals and appointments  In addition, I have reviewed and discussed with patient certain preventive protocols, quality metrics, and best practice recommendations. A written personalized care plan for preventive services as well as general preventive health recommendations were provided to patient.     Chriss Driver, LPN   80/07/4915   Nurse Notes: IN PERSON VISIT AT [facility]. Pt up to date on health maintenance. Pt declines repeat bone density at this time. Discussed Shingles vaccine and how to obtain. 6CIT score of 0.

## 2021-04-25 NOTE — Telephone Encounter (Signed)
Pt here for AWV.  Pt c/o changing mole to abdomen and increasing hair loss. Pt asks if she can see a Dermatologist? Thank you.

## 2021-04-26 ENCOUNTER — Other Ambulatory Visit: Payer: Self-pay | Admitting: Family Medicine

## 2021-04-26 DIAGNOSIS — D229 Melanocytic nevi, unspecified: Secondary | ICD-10-CM

## 2021-05-30 ENCOUNTER — Telehealth: Payer: Self-pay | Admitting: Cardiology

## 2021-05-30 MED ORDER — CLONIDINE HCL 0.2 MG PO TABS: ORAL_TABLET | ORAL | 3 refills | Status: DC

## 2021-05-30 MED ORDER — METOPROLOL TARTRATE 25 MG PO TABS: ORAL_TABLET | ORAL | 3 refills | Status: DC

## 2021-05-30 MED ORDER — SPIRONOLACTONE 25 MG PO TABS: 12.5000 mg | ORAL_TABLET | Freq: Every day | ORAL | 3 refills | Status: DC

## 2021-05-30 NOTE — Telephone Encounter (Signed)
Complete

## 2021-05-30 NOTE — Telephone Encounter (Signed)
°*  STAT* If patient is at the pharmacy, call can be transferred to refill team.   1. Which medications need to be refilled? (please list name of each medication and dose if known)  metoprolol tartrate (LOPRESSOR) 25 MG tablet cloNIDine (CATAPRES) 0.2 MG tablet spironolactone (ALDACTONE) 25 MG tablet  2. Which pharmacy/location (including street and city if local pharmacy) is medication to be sent to? [facility] [phone] [place]  3. Do they need a 30 day or 90 day supply? 90 day

## 2021-08-14 ENCOUNTER — Ambulatory Visit (INDEPENDENT_AMBULATORY_CARE_PROVIDER_SITE_OTHER): Payer: Medicare Other | Admitting: Nurse Practitioner

## 2021-08-14 ENCOUNTER — Encounter: Payer: Self-pay | Admitting: Nurse Practitioner

## 2021-08-14 VITALS — BP 182/74 | HR 78 | Temp 98.2°F | Wt 180.0 lb

## 2021-08-14 DIAGNOSIS — I10 Essential (primary) hypertension: Secondary | ICD-10-CM

## 2021-08-14 DIAGNOSIS — Z8639 Personal history of other endocrine, nutritional and metabolic disease: Secondary | ICD-10-CM

## 2021-08-14 DIAGNOSIS — L649 Androgenic alopecia, unspecified: Secondary | ICD-10-CM

## 2021-08-14 DIAGNOSIS — R32 Unspecified urinary incontinence: Secondary | ICD-10-CM | POA: Diagnosis not present

## 2021-08-14 DIAGNOSIS — R151 Fecal smearing: Secondary | ICD-10-CM | POA: Diagnosis not present

## 2021-08-14 DIAGNOSIS — Z13220 Encounter for screening for lipoid disorders: Secondary | ICD-10-CM

## 2021-08-14 NOTE — Progress Notes (Signed)
? ?  Subjective:  ? ? Patient ID: [name], female    DOB: [birth year], 81 y.o.   MRN: [id number] ? ?HPI ? ?81 year old female patient with history of hypertension, asthma, hyperkalemia arrives today with bladder leakage for about 2 months. Pt states that she will get up to go to the bathroom and "her house shoes will be filled with water".  Patient states that she has history of her bladder "falling".  Pt also states that her "butt stays dirty", pt states she can wipe and still not be clean.  Patient denies any bloody urine or bloody stool.  Patient states that she normally wears a incontinence pad but has not been enough lately. ? ? Pt also states hair is falling out and nails are breaking for about 2 months.  Patient denies any fatigue, cold intolerance, heat intolerance. ? ? ?Review of Systems  ?Genitourinary:   ?     Incontinence  ?Skin:   ?     Hair thinning  ?All other systems reviewed and are negative. ? ?   ?Objective:  ? Physical Exam ?Constitutional:   ?   General: She is not in acute distress. ?   Appearance: Normal appearance. She is obese. She is not ill-appearing, toxic-appearing or diaphoretic.  ?Cardiovascular:  ?   Rate and Rhythm: Normal rate and regular rhythm.  ?   Pulses: Normal pulses.  ?   Heart sounds: Normal heart sounds. No murmur heard. ?Pulmonary:  ?   Effort: Pulmonary effort is normal. No respiratory distress.  ?   Breath sounds: Normal breath sounds. No wheezing.  ?Genitourinary: ?   Comments: Patient declined exam today ?Musculoskeletal:  ?   Comments: Uses walker to ambulate  ?Skin: ?   General: Skin is warm.  ?   Capillary Refill: Capillary refill takes less than 2 seconds.  ?Neurological:  ?   Mental Status: She is alert.  ?   Comments: Grossly intact  ?Psychiatric:     ?   Mood and Affect: Mood normal.     ?   Behavior: Behavior normal.  ? ? ?   ?Assessment & Plan:  ? ?1. Urinary/Fecal incontinence, unspecified type ?-Likely mix incontinence.  ?- Patient declined pelvic  evaluation today. ?- Offered to refer patient to GYN for evaluation. Patient declined stating that she does not have a uterus and is not interested in having any procedures done.  ?- Will refer patient to physical therapy for pelvic floor rehab.  ?- Patient seeing urologist for worsening GFR. Patient has urology appt on 4/26. Follow up with urologist as scheduled.  ?- Use adult briefs to help with symptoms. ?- Follow up as needed. ? ?2. Female pattern baldness ?- Vitamin D (25 hydroxy) ?- Fe+TIBC+Fer ?- DHEA ? ?3. Hypercalcemia ?- Calcium ? ?4. Primary hypertension ?- BP 182/74 on 152/80 on recheck. Goal of 140/90 not met.  ?- Continue taking blood pressure medication as prescribed.  ?- CBC with Differential ?- TSH + free T4 ?- CMP14+EGFR ?- HgB A1c ?- Follow up in 6 months or sooner if needed.  ? ?5. Lipid screening ?- Lipid Profile ? ?  ?Note:  This document was prepared using Dragon voice recognition software and may include unintentional dictation errors. ? ? ?

## 2021-08-17 LAB — VITAMIN D 25 HYDROXY (VIT D DEFICIENCY, FRACTURES): Vit D, 25-Hydroxy: 30.5 ng/mL (ref 30.0–100.0)

## 2021-08-17 LAB — CMP14+EGFR
ALT: 13 IU/L (ref 0–32)
AST: 23 IU/L (ref 0–40)
Albumin/Globulin Ratio: 1.7 (ref 1.2–2.2)
Albumin: 4.9 g/dL — ABNORMAL HIGH (ref 3.6–4.6)
Alkaline Phosphatase: 125 IU/L — ABNORMAL HIGH (ref 44–121)
BUN/Creatinine Ratio: 21 (ref 12–28)
BUN: 27 mg/dL (ref 8–27)
Bilirubin Total: 0.4 mg/dL (ref 0.0–1.2)
CO2: 22 mmol/L (ref 20–29)
Calcium: 10.4 mg/dL — ABNORMAL HIGH (ref 8.7–10.3)
Chloride: 101 mmol/L (ref 96–106)
Creatinine, Ser: 1.27 mg/dL — ABNORMAL HIGH (ref 0.57–1.00)
Globulin, Total: 2.9 g/dL (ref 1.5–4.5)
Glucose: 106 mg/dL — ABNORMAL HIGH (ref 70–99)
Potassium: 4.2 mmol/L (ref 3.5–5.2)
Sodium: 139 mmol/L (ref 134–144)
Total Protein: 7.8 g/dL (ref 6.0–8.5)
eGFR: 42 mL/min/{1.73_m2} — ABNORMAL LOW (ref >59)

## 2021-08-17 LAB — CBC WITH DIFFERENTIAL/PLATELET
Basophils Absolute: 0.1 10*3/uL (ref 0.0–0.2)
Basos: 1 %
EOS (ABSOLUTE): 0.2 10*3/uL (ref 0.0–0.4)
Eos: 2 %
Hematocrit: 40.6 % (ref 34.0–46.6)
Hemoglobin: 13.3 g/dL (ref 11.1–15.9)
Immature Grans (Abs): 0 10*3/uL (ref 0.0–0.1)
Immature Granulocytes: 0 %
Lymphocytes Absolute: 4 10*3/uL — ABNORMAL HIGH (ref 0.7–3.1)
Lymphs: 46 %
MCH: 29.3 pg (ref 26.6–33.0)
MCHC: 32.8 g/dL (ref 31.5–35.7)
MCV: 89 fL (ref 79–97)
Monocytes Absolute: 0.7 10*3/uL (ref 0.1–0.9)
Monocytes: 9 %
Neutrophils Absolute: 3.7 10*3/uL (ref 1.4–7.0)
Neutrophils: 42 %
Platelets: 368 10*3/uL (ref 150–450)
RBC: 4.54 x10E6/uL (ref 3.77–5.28)
RDW: 12.4 % (ref 11.7–15.4)
WBC: 8.7 10*3/uL (ref 3.4–10.8)

## 2021-08-17 LAB — LIPID PANEL
Chol/HDL Ratio: 3.2 ratio (ref 0.0–4.4)
Cholesterol, Total: 191 mg/dL (ref 100–199)
HDL: 60 mg/dL (ref >39)
LDL Chol Calc (NIH): 113 mg/dL — ABNORMAL HIGH (ref 0–99)
Triglycerides: 103 mg/dL (ref 0–149)
VLDL Cholesterol Cal: 18 mg/dL (ref 5–40)

## 2021-08-17 LAB — IRON,TIBC AND FERRITIN PANEL
Ferritin: 261 ng/mL — ABNORMAL HIGH (ref 15–150)
Iron Saturation: 23 % (ref 15–55)
Iron: 75 ug/dL (ref 27–139)
Total Iron Binding Capacity: 323 ug/dL (ref 250–450)
UIBC: 248 ug/dL (ref 118–369)

## 2021-08-17 LAB — TSH+FREE T4
Free T4: 1.16 ng/dL (ref 0.82–1.77)
TSH: 1.05 u[IU]/mL (ref 0.450–4.500)

## 2021-08-17 LAB — HEMOGLOBIN A1C
Est. average glucose Bld gHb Est-mCnc: 128 mg/dL
Hgb A1c MFr Bld: 6.1 % — ABNORMAL HIGH (ref 4.8–5.6)

## 2021-08-17 LAB — DHEA: Dehydroepiandrosterone (DHEA): 81 ng/dL (ref 21–402)

## 2021-09-21 ENCOUNTER — Ambulatory Visit (HOSPITAL_COMMUNITY): Payer: Medicare Other | Attending: Family Medicine | Admitting: Physical Therapy

## 2021-09-21 DIAGNOSIS — R32 Unspecified urinary incontinence: Secondary | ICD-10-CM | POA: Diagnosis not present

## 2021-09-21 DIAGNOSIS — N3946 Mixed incontinence: Secondary | ICD-10-CM | POA: Insufficient documentation

## 2021-09-21 NOTE — Therapy (Signed)
?OUTPATIENT PHYSICAL THERAPY FEMALE PELVIC EVALUATION ? ? ?Patient Name: [name] ?MRN: [id number] ?DOB:1941/04/25, 81 y.o., female ?Today's Date: 09/21/2021 ? ? PT End of Session - 09/21/21 1410   ? ? Visit Number 1   ? Number of Visits 4   ? Date for PT Re-Evaluation 10/21/21   ? Authorization Type [facility] medicare   ? Progress Note Due on Visit 4   ? PT Start Time 1410   ? PT Stop Time 5573   ? PT Time Calculation (min) 35 min   ? Activity Tolerance Patient tolerated treatment well   ? Behavior During Therapy [facility] for tasks assessed/performed   ? ?  ?  ? ?  ? ? ?Past Medical History:  ?Diagnosis Date  ? Allergic rhinitis   ? Arthritis   ? Asthma   ? Hypertension   ? IFG (impaired fasting glucose)   ? Osteopenia   ? Spinal stenosis   ? ?Past Surgical History:  ?Procedure Laterality Date  ? ABDOMINAL HYSTERECTOMY    ? BACK SURGERY    ? BREAST EXCISIONAL BIOPSY Left   ? benign  ? CARPAL TUNNEL RELEASE    ? CHOLECYSTECTOMY    ? KNEE ARTHROSCOPY Left   ? TOTAL ABDOMINAL HYSTERECTOMY W/ BILATERAL SALPINGOOPHORECTOMY    ? ?Patient Active Problem List  ? Diagnosis Date Noted  ? Urinary incontinence 08/14/2021  ? Pain of left hip joint 07/18/2020  ? Hypercalcemia 03/27/2020  ? Impaired fasting glucose 02/21/2015  ? Unspecified gastritis and gastroduodenitis without mention of hemorrhage 09/28/2013  ? HTN (hypertension) 08/26/2012  ? Cataracts, bilateral 08/04/2012  ? ? ?PCP: Amado Nash ? ?REFERRING PROVIDER: [facility], Trenton Gammon, NP  ? ?REFERRING DIAG: R32 (ICD-10-CM) - Urinary incontinence, unspecified type ? ?THERAPY DIAG:  ?Mixed stress and urge urinary incontinence ? ?ONSET DATE: 06/02/2021 ? ?SUBJECTIVE:                                                                                                                                                                                          ? ?SUBJECTIVE STATEMENT: ?[name] states that when she feels the urge to go to the bathroom she is not able to make it to the  toilet.  SHe ?Use to only use a pad if she was going to the store but now she has to use one if she is in her own home.  She is using about ?Six pads a day.  She is also incontinent in her bowels, states that she is leaking all day long.  The bowel incontinence started three ?Months ago.  ?Fluid intake: Yes:     ?Pt  does not drink much water, drinks coffee.  ? ? ? ?PAIN:  ?Are you having pain? No ?PRECAUTIONS: None ? ?WEIGHT BEARING RESTRICTIONS No ? ?FALLS:  ?Has patient fallen in last 6 months? No  ? ?LIVING ENVIRONMENT: ?Lives with: lives with their spouse ?Lives in: House/apartment ?OCCUPATION: retired ? ?PLOF: Independent with basic ADLs ? ?PATIENT GOALS to be more [place]. ? ?PERTINENT HISTORY:  ?4 children all vaginal ?URINATION ?Pain with urination: No ?Fully empty bladder: Yes:   ?Stream: Strong ?Urgency: Yes:   ?Frequency: leaks every time she is going to go to the bathroom  ?Leakage: Urge to void and Walking to the bathroom ?Pads: Yes: 6 ? ?INTERCOURSE ?Pain with intercourse:  N/A ?PREGNANCY ?Vaginal deliveries 4 ? ? ?PROLAPSE ?+ for bladder; pt has had a hysterectomy but Uterine was prolapsed prior.  Rectal unknown.  ? ? ? ?OBJECTIVE:  ? ?POSTURE:  ?Forward head, increased kyphosis, protruding abdominal mm  ? ? ?LE MMT: abdominal mm 2/5; pelvic 2- ?                      ? ? ?TODAY'S TREATMENT  ?EVAL 09/21/2021 ?Abdominal set 5 x hold 5 seconds  ?Kegal 5 seconds rest 10 seconds x 5 ?Glut set x 5  ? ? ?PATIENT EDUCATION:  ?Education details: HEP ?Person educated: Patient ?Education method: Explanation ?Education comprehension: verbalized understanding ? ? ?HOME EXERCISE PROGRAM: ?Abdominal set 5 x hold 5 seconds  ?Kegal 5 seconds rest 10 seconds x 5 ?Glut set x 5  ? ?ASSESSMENT: ? ?CLINICAL IMPRESSION: ?Patient is a 81 y.o. female who was seen today for physical therapy evaluation and treatment for incontinence. The pt has signs of both urge and stress incontinence and has bowel incontinence as well.   Evaluation demonstrates poor posture as well as significant decreased core strength.  [name] will benefit from skilled PT to address these issues.  ? ? ?OBJECTIVE IMPAIRMENTS decreased strength and incontinence  .  ? ?ACTIVITY LIMITATIONS community activity.  ? ?PERSONAL FACTORS Age and Time since onset of injury/illness/exacerbation are also affecting patient's functional outcome.  ? ? ?REHAB POTENTIAL: Fair  ? ?CLINICAL DECISION MAKING: Evolving/moderate complexity ? ?EVALUATION COMPLEXITY: Moderate ? ? ?GOALS: ?Goals reviewed with patient? No ? ?SHORT TERM GOALS: Target date: 10/05/2021   ? ?PT to be I with HEP to note that she is only having to use 4-5 pads/day. ?Baseline: ?Goal status: INITIAL ? ? ?LONG TERM GOALS: Target date: 10/19/2021   ?            1.  Pt to be I with advanced HEP to not that she is only having to use 3 or less pads /day ?Goal status: INITIAL ? ?2.  Pt to understand that she is going to have to complete the exercises for 3-6 months to become ?     Dry.  ?Baseline:  ?Goal status: INITIAL ? ?PLAN: ?PT FREQUENCY: 1x/week ? ?PT DURATION: 4 weeks ? ?PLANNED INTERVENTIONS: Therapeutic exercises and Patient/Family education ? ?PLAN FOR NEXT SESSION: begin heel slides, crunch and side lying hip abduction as well as fast twitch kegals  ? ?Rayetta Humphrey, PT CLT ?[phone]  ?09/21/2021, 2:11 PM  ?

## 2021-09-25 ENCOUNTER — Telehealth: Payer: Self-pay | Admitting: Cardiology

## 2021-09-25 MED ORDER — SPIRONOLACTONE 25 MG PO TABS: 12.5000 mg | ORAL_TABLET | Freq: Every day | ORAL | 0 refills | Status: DC

## 2021-09-25 NOTE — Telephone Encounter (Signed)
1 week  supply aldactone sent to [facility] ?

## 2021-09-25 NOTE — Telephone Encounter (Signed)
?*  STAT* If patient is at the pharmacy, call can be transferred to refill team. ? ? ?1. Which medications need to be refilled? (please list name of each medication and dose if known)  ?spironolactone (ALDACTONE) 25 MG tablet ? ?2. Which pharmacy/location (including street and city if local pharmacy) is medication to be sent to? ?[place], [place] - [address] ? ?3. Do they need a 30 day or 90 day supply?  ? ?Patient states she is completely out of medication. She is requesting an emergency 1 week supply to last her until her mail order arrives. ? ?

## 2021-09-26 ENCOUNTER — Ambulatory Visit (HOSPITAL_COMMUNITY)
Admission: RE | Admit: 2021-09-26 | Discharge: 2021-09-26 | Disposition: A | Discharge status: Home / Self Care | Payer: Medicare Other | Source: Ambulatory Visit | Attending: Nurse Practitioner | Admitting: Nurse Practitioner

## 2021-09-26 ENCOUNTER — Ambulatory Visit (INDEPENDENT_AMBULATORY_CARE_PROVIDER_SITE_OTHER): Payer: Medicare Other | Admitting: Nurse Practitioner

## 2021-09-26 ENCOUNTER — Encounter: Payer: Self-pay | Admitting: Nurse Practitioner

## 2021-09-26 VITALS — BP 166/72 | HR 78 | Temp 98.4°F | Wt 173.0 lb

## 2021-09-26 DIAGNOSIS — J189 Pneumonia, unspecified organism: Secondary | ICD-10-CM | POA: Insufficient documentation

## 2021-09-26 MED ORDER — AMOXICILLIN 500 MG PO CAPS
1000.0000 mg | ORAL_CAPSULE | Freq: Three times a day (TID) | ORAL | 0 refills | Status: AC
Start: 2021-09-26 — End: 2021-10-01

## 2021-09-26 NOTE — Progress Notes (Signed)
? ?  Subjective:  ? ? Patient ID: [name], female    DOB: Feb 15, 1941, 81 y.o.   MRN: [id number] ? ?HPI ? ?81 year old female patient with complaint of productive cough with green mucus for about 3 weeks and congestion.  Patient also admits to night sweats, chills, wheezing, some shortness of breath.  Patient has used albuterol inhaler without much relief.  Patient is also use Mucinex without much relief.  Patient denies sore throat ear pain, sinus pain. ? ?Review of Systems  ?Constitutional:  Positive for chills.  ?HENT:  Positive for congestion.   ?Respiratory:  Positive for cough, shortness of breath and wheezing.   ?All other systems reviewed and are negative. ? ?   ?Objective:  ? Physical Exam ?Vitals reviewed.  ?Constitutional:   ?   General: She is not in acute distress. ?   Appearance: She is obese. She is not ill-appearing, toxic-appearing or diaphoretic.  ?Cardiovascular:  ?   Rate and Rhythm: Normal rate and regular rhythm.  ?   Pulses: Normal pulses.  ?   Heart sounds: Normal heart sounds. No murmur heard. ?Pulmonary:  ?   Effort: Pulmonary effort is normal. No respiratory distress.  ?   Breath sounds: No stridor. Wheezing and rhonchi present. No rales.  ?   Comments: Mild wheezing or rhonchi heard to you middle bilateral lobes ?Chest:  ?   Chest wall: No tenderness.  ?Musculoskeletal:  ?   Comments: Grossly intact.  Uses walker to ambulate  ?Skin: ?   General: Skin is warm.  ?   Capillary Refill: Capillary refill takes less than 2 seconds.  ?Neurological:  ?   Mental Status: She is alert.  ?   Comments: Grossly intact  ?Psychiatric:     ?   Mood and Affect: Mood normal.     ?   Behavior: Behavior normal.  ? ? ?   ?Assessment & Plan:  ? ?1. Pneumonia due to infectious organism, unspecified laterality, unspecified part of lung ?-Suspect pneumonia despite lack of infiltrates on x-ray  ?-Due to history of asthma and due to findings on physical exam of wheezing and rhonchi to upper lobes we will treat  with high-dose amoxicillin for 5 days ?- DG Chest 2 View.  Results reviewed and discussed with patient ?- amoxicillin (AMOXIL) 500 MG capsule; Take 2 capsules (1,000 mg total) by mouth 3 (three) times daily for 5 days.  Dispense: 30 capsule; Refill: 0 ?-May use albuterol as needed for symptoms ?-Return to clinic if symptoms are not better within 2 to 3 days of antibiotic use ? ?  ?Note:  This document was prepared using Dragon voice recognition software and may include unintentional dictation errors. ?Note - This record has been created using [employer].  ?Chart creation errors have been sought, but may not always  ?have been located. Such creation errors do not reflect on  ?the standard of medical care. ? ? ?

## 2021-10-12 ENCOUNTER — Ambulatory Visit (HOSPITAL_COMMUNITY): Payer: Medicare Other | Attending: Nurse Practitioner | Admitting: Physical Therapy

## 2021-10-12 DIAGNOSIS — N3946 Mixed incontinence: Secondary | ICD-10-CM | POA: Diagnosis present

## 2021-10-12 NOTE — Therapy (Signed)
OUTPATIENT PHYSICAL THERAPY FEMALE PELVIC EVALUATION   Patient Name: [name] MRN: [id number] DOB:1940-10-24, 81 y.o., female Today's Date: 10/12/2021   PT End of Session - 10/12/21 1322     Visit Number 2    Number of Visits 4    Date for PT Re-Evaluation 10/21/21    Authorization Type [facility] medicare    Progress Note Due on Visit 4    PT Start Time 3790    PT Stop Time 1400    PT Time Calculation (min) 35 min    Activity Tolerance Patient tolerated treatment well    Behavior During Therapy WFL for tasks assessed/performed             Past Medical History:  Diagnosis Date   Allergic rhinitis    Arthritis    Asthma    Hypertension    IFG (impaired fasting glucose)    Osteopenia    Spinal stenosis    Past Surgical History:  Procedure Laterality Date   ABDOMINAL HYSTERECTOMY     BACK SURGERY     BREAST EXCISIONAL BIOPSY Left    benign   CARPAL TUNNEL RELEASE     CHOLECYSTECTOMY     KNEE ARTHROSCOPY Left    TOTAL ABDOMINAL HYSTERECTOMY W/ BILATERAL SALPINGOOPHORECTOMY     Patient Active Problem List   Diagnosis Date Noted   Urinary incontinence 08/14/2021   Pain of left hip joint 07/18/2020   Hypercalcemia 03/27/2020   Impaired fasting glucose 02/21/2015   Unspecified gastritis and gastroduodenitis without mention of hemorrhage 09/28/2013   HTN (hypertension) 08/26/2012   Cataracts, bilateral 08/04/2012    PCP: Amado Nash  REFERRING PROVIDER: [employer], Trenton Gammon, NP   REFERRING DIAG: R32 (ICD-10-CM) - Urinary incontinence, unspecified type  THERAPY DIAG:  Mixed stress and urge urinary incontinence  ONSET DATE: 06/02/2021  SUBJECTIVE:                                                                                                                                                                                           SUBJECTIVE STATEMENT:  Pt states she has been doing her exercises.  She states that she was doing her exercises and she  really Cramped in her bottom but it hasn't happened since.   PAIN:  Are you having pain? No PRECAUTIONS: None  WEIGHT BEARING RESTRICTIONS No  FALLS:  Has patient fallen in last 6 months? No   LIVING ENVIRONMENT: Lives with: lives with their spouse Lives in: House/apartment OCCUPATION: retired  PLOF: Independent with basic ADLs  PATIENT GOALS to be more continent.  PERTINENT HISTORY:  4 children all vaginal URINATION Pain  with urination: No Fully empty bladder: Yes:   Stream: Strong Urgency: Yes:   Frequency: leaks every time she is going to go to the bathroom  Leakage: Urge to void and Walking to the bathroom Pads: Yes: 6  INTERCOURSE Pain with intercourse:  N/A PREGNANCY Vaginal deliveries 4   PROLAPSE + for bladder; pt has had a hysterectomy but Uterine was prolapsed prior.  Rectal unknown.     OBJECTIVE:   POSTURE:  Forward head, increased kyphosis, protruding abdominal mm    LE MMT: abdominal mm 2/5; pelvic 2-                         TODAY'S TREATMENT                10/12/2021               Standing ab set x 10               Sitting:               Kegal quick flick 2 sec rest 4 x 10               KEgal hold 10 rest 20 x 3               Supine:  ab set 5" hold x 10                Crunch x 5                Heelslide x 5 B               Side lying:               Hip abduction x 5 B   EVAL 09/21/2021 Abdominal set 5 x hold 5 seconds  Kegal 5 seconds rest 10 seconds x 5 Glut set x 5    PATIENT EDUCATION:  Education details: HEP Person educated: Patient Education method: Explanation Education comprehension: verbalized understanding   HOME EXERCISE PROGRAM: Abdominal set 5 x hold 5 seconds  Kegal 5 seconds rest 10 seconds x 5 Glut set x 5              6/1: Supine:  ab set 5" hold x 10                Crunch x 5                Heelslide x 5 B               Side lying:               Hip abduction x 5 B  ASSESSMENT:  CLINICAL  IMPRESSION: Pt is very involved as far as incontinence; she has not noted any improvement but therapist advised as involved as she is this would be expected.  Added HEP as above.  Pt will continue To benefit from skilled Pt to improve her core and pelvic strength to decrease incontinence.    OBJECTIVE IMPAIRMENTS decreased strength and incontinence  .   ACTIVITY LIMITATIONS community activity.   PERSONAL FACTORS Age and Time since onset of injury/illness/exacerbation are also affecting patient's functional outcome.    REHAB POTENTIAL: Fair   CLINICAL DECISION MAKING: Evolving/moderate complexity  EVALUATION COMPLEXITY: Moderate   GOALS: Goals reviewed with patient? No  SHORT TERM GOALS: Target date: 10/05/2021    PT to be  I with HEP to note that she is only having to use 4-5 pads/day. Baseline: Goal status: INITIAL   LONG TERM GOALS: Target date: 10/19/2021               1.  Pt to be I with advanced HEP to not that she is only having to use 3 or less pads /day Goal status: INITIAL  2.  Pt to understand that she is going to have to complete the exercises for 3-6 months to become      Dry.  Baseline:  Goal status: INITIAL  PLAN: PT FREQUENCY: 1x/week  PT DURATION: 4 weeks  PLANNED INTERVENTIONS: Therapeutic exercises and Patient/Family education  PLAN FOR NEXT SESSION: begin heel slides, crunch and side lying hip abduction as well as fast twitch Barnabas Harries, PT CLT [phone]  10/12/2021, 1:58 PM

## 2021-10-16 ENCOUNTER — Ambulatory Visit (HOSPITAL_COMMUNITY): Payer: Medicare Other | Attending: Family Medicine | Admitting: Physical Therapy

## 2021-10-16 DIAGNOSIS — N3946 Mixed incontinence: Secondary | ICD-10-CM | POA: Insufficient documentation

## 2021-10-16 DIAGNOSIS — R32 Unspecified urinary incontinence: Secondary | ICD-10-CM | POA: Diagnosis present

## 2021-10-16 NOTE — Therapy (Signed)
OUTPATIENT PHYSICAL THERAPY FEMALE PELVIC EVALUATION   Patient Name: [name] MRN: [id number] DOB:12/06/1940, 81 y.o., female Today's Date: 10/16/2021   PT End of Session - 10/16/21 1646     Visit Number 3    Number of Visits 4    Date for PT Re-Evaluation 10/21/21    Authorization Type [facility] medicare    Progress Note Due on Visit 4    PT Start Time 1645   PT 30 minutes late.   PT Stop Time 8099    PT Time Calculation (min) 30 min    Activity Tolerance Patient tolerated treatment well    Behavior During Therapy WFL for tasks assessed/performed             Past Medical History:  Diagnosis Date   Allergic rhinitis    Arthritis    Asthma    Hypertension    IFG (impaired fasting glucose)    Osteopenia    Spinal stenosis    Past Surgical History:  Procedure Laterality Date   ABDOMINAL HYSTERECTOMY     BACK SURGERY     BREAST EXCISIONAL BIOPSY Left    benign   CARPAL TUNNEL RELEASE     CHOLECYSTECTOMY     KNEE ARTHROSCOPY Left    TOTAL ABDOMINAL HYSTERECTOMY W/ BILATERAL SALPINGOOPHORECTOMY     Patient Active Problem List   Diagnosis Date Noted   Urinary incontinence 08/14/2021   Pain of left hip joint 07/18/2020   Hypercalcemia 03/27/2020   Impaired fasting glucose 02/21/2015   Unspecified gastritis and gastroduodenitis without mention of hemorrhage 09/28/2013   HTN (hypertension) 08/26/2012   Cataracts, bilateral 08/04/2012    PCP: Amado Nash  REFERRING PROVIDER: Claire Shown, NP   REFERRING DIAG: R32 (ICD-10-CM) - Urinary incontinence, unspecified type  THERAPY DIAG:  Mixed stress and urge urinary incontinence  ONSET DATE: 06/02/2021  SUBJECTIVE:                                                                                                                                                                                           SUBJECTIVE STATEMENT:  Pt states she has not been doing her exercises as she has been helping her sister move.    PAIN:  Are you having pain? No PRECAUTIONS: None  WEIGHT BEARING RESTRICTIONS No  FALLS:  Has patient fallen in last 6 months? No   LIVING ENVIRONMENT: Lives with: lives with their spouse Lives in: House/apartment OCCUPATION: retired  PLOF: Independent with basic ADLs  PATIENT GOALS to be more continent.  PERTINENT HISTORY:  4 children all vaginal URINATION Pain with urination: No Fully empty bladder: Yes:  Stream: Strong Urgency: Yes:   Frequency: leaks every time she is going to go to the bathroom  Leakage: Urge to void and Walking to the bathroom Pads: Yes: 6  INTERCOURSE Pain with intercourse:  N/A PREGNANCY Vaginal deliveries 4   PROLAPSE + for bladder; pt has had a hysterectomy but Uterine was prolapsed prior.  Rectal unknown.     OBJECTIVE:   POSTURE:  Forward head, increased kyphosis, protruding abdominal mm    LE MMT: abdominal mm 2/5; pelvic 2-                         TODAY'S TREATMENT  10/16/2021                           Sitting:                           Tband :  RT flexion x 5; Lt flexion x 5; RT hand to LT hip x 5; LT hand to RT hip x 5                              Supine:  ab set 5" hold x 10                Crunch x 10               Heel slide x 10 B               Side lying:               Hip abduction x 10 B                10/12/2021               Standing ab set x 10               Sitting:               Kegal quick flick 2 sec rest 4 x 10               KEgal hold 10 rest 20 x 3               Supine:  ab set 5" hold x 10                Crunch x 5                Heelslide x 5 B               Side lying:               Hip abduction x 5 B   EVAL 09/21/2021 Abdominal set 5 x hold 5 seconds  Kegal 5 seconds rest 10 seconds x 5 Glut set x 5    PATIENT EDUCATION:  Education details: HEP Person educated: Patient Education method: Explanation Education comprehension: verbalized understanding   HOME EXERCISE  PROGRAM: Abdominal set 5 x hold 5 seconds  Kegal 5 seconds rest 10 seconds x 5 Glut set x 5              6/1: Supine:  ab set 5" hold x 10                Crunch x 5  Heelslide x 5 B               Side lying:               Hip abduction x 5 B  ASSESSMENT:  CLINICAL IMPRESSION: No significant changes as pt has not completed any of her exercises at this time.  Pt 30 minutes late for appointment today.  Added theraband Exercises for shoulder flexion, PNF 2 pattern as well as B shoulder extension.  PT will benefit from two more appointments to make sure Pt is I in all  Pelvic floor exercises  OBJECTIVE IMPAIRMENTS decreased strength and incontinence  .   ACTIVITY LIMITATIONS community activity.   PERSONAL FACTORS Age and Time since onset of injury/illness/exacerbation are also affecting patient's functional outcome.    REHAB POTENTIAL: Fair   CLINICAL DECISION MAKING: Evolving/moderate complexity  EVALUATION COMPLEXITY: Moderate   GOALS: Goals reviewed with patient? No  SHORT TERM GOALS: Target date: 10/05/2021    PT to be I with HEP to note that she is only having to use 4-5 pads/day. Baseline: Goal status: INITIAL   LONG TERM GOALS: Target date: 10/19/2021               1.  Pt to be I with advanced HEP to not that she is only having to use 3 or less pads /day Goal status: INITIAL  2.  Pt to understand that she is going to have to complete the exercises for 3-6 months to become      Dry.  Baseline:  Goal status: INITIAL  PLAN: PT FREQUENCY: 1x/week  PT DURATION: 4 weeks  PLANNED INTERVENTIONS: Therapeutic exercises and Patient/Family education  PLAN FOR NEXT SESSION: Add theraband putting exercises, attempt all four exercises but may need to modify to prone hip extension and opposite Arm/leg  Rayetta Humphrey, PT CLT [phone]  10/16/2021,

## 2021-11-01 ENCOUNTER — Encounter (HOSPITAL_COMMUNITY): Payer: Self-pay | Admitting: Physical Therapy

## 2021-11-01 ENCOUNTER — Ambulatory Visit (HOSPITAL_COMMUNITY): Payer: Medicare Other | Attending: Family Medicine | Admitting: Physical Therapy

## 2021-11-01 DIAGNOSIS — N3946 Mixed incontinence: Secondary | ICD-10-CM | POA: Insufficient documentation

## 2021-11-01 NOTE — Therapy (Signed)
OUTPATIENT PHYSICAL THERAPY FEMALE PELVIC EVALUATION   Patient Name: [name] MRN: [id number] DOB:[birth year], 81 y.o., female Today's Date: 11/01/2021 PHYSICAL THERAPY DISCHARGE SUMMARY  Visits from Start of Care: 4  Current functional level related to goals / functional outcomes: Pt is able to make it to the bathroom without incontinence 50% of the time    Remaining deficits: As above   Education / Equipment: HEP   Patient agrees to discharge. Patient goals were partially met. Patient is being discharged due to being pleased with the current functional level.   PT End of Session - 11/01/21 1150     Visit Number 4    Number of Visits 4    Date for PT Re-Evaluation 10/21/21    Authorization Type [facility] medicare    PT Start Time 2426    PT Stop Time 1145    PT Time Calculation (min) 53 min             Past Medical History:  Diagnosis Date   Allergic rhinitis    Arthritis    Asthma    Hypertension    IFG (impaired fasting glucose)    Osteopenia    Spinal stenosis    Past Surgical History:  Procedure Laterality Date   ABDOMINAL HYSTERECTOMY     BACK SURGERY     BREAST EXCISIONAL BIOPSY Left    benign   CARPAL TUNNEL RELEASE     CHOLECYSTECTOMY     KNEE ARTHROSCOPY Left    TOTAL ABDOMINAL HYSTERECTOMY W/ BILATERAL SALPINGOOPHORECTOMY     Patient Active Problem List   Diagnosis Date Noted   Urinary incontinence 08/14/2021   Pain of left hip joint 07/18/2020   Hypercalcemia 03/27/2020   Impaired fasting glucose 02/21/2015   Unspecified gastritis and gastroduodenitis without mention of hemorrhage 09/28/2013   HTN (hypertension) 08/26/2012   Cataracts, bilateral 08/04/2012    PCP: Amado Nash  REFERRING PROVIDER: [employer], Trenton Gammon, NP   REFERRING DIAG: R32 (ICD-10-CM) - Urinary incontinence, unspecified type  THERAPY DIAG:  Mixed stress and urge urinary incontinence  ONSET DATE: 06/02/2021  SUBJECTIVE:                                                                                                                                                                                            SUBJECTIVE STATEMENT:  Pt states she has a routine now and is completing her exercises.  She made it to the Bathroom without wetting herself.   PAIN:  Are you having pain? No PRECAUTIONS: None  WEIGHT BEARING RESTRICTIONS No  FALLS:  Has patient fallen in last 6 months? No  LIVING ENVIRONMENT: Lives with: lives with their spouse Lives in: House/apartment OCCUPATION: retired  PLOF: Independent with basic ADLs  PATIENT GOALS to be more continent.  PERTINENT HISTORY:  4 children all vaginal URINATION Pain with urination: No Fully empty bladder: Yes:   Stream: Strong Urgency: Yes:   Frequency: Pt is finding that she can make it to the bathroom 50% of the time now was leaks every time she is going to go to the bathroom  Leakage: Urge to void and Walking to the bathroom Pads: Yes: 5 was 6  INTERCOURSE Pain with intercourse:  N/A PREGNANCY Vaginal deliveries 4   PROLAPSE + for bladder; pt has had a hysterectomy but Uterine was prolapsed prior.  Rectal unknown.     OBJECTIVE:   POSTURE:  Forward head, increased kyphosis, protruding abdominal mm    LE MMT: abdominal mm 2/5; pelvic 2-                         TODAY'S TREATMENT  11/01/20                    Sitting: facing the door.                            Tband :  RT flexion x 10; Lt flexion x 10; RT hand to LT hip x 10; LT hand to RT hip x 10                                         B hands to B hips with red theraband x 10;                    Sitting side to the door:  putting motion to the RT x 10 switch sides and to the lt 10 x                              Supine:  ab set 5" hold x 10                Crunch x 10               Heel slide x 10 B               Side lying:               Hip abduction x 10 B       Kegal 15" hold x 10             10/16/2021                            Sitting:                           Tband :  RT flexion x 5; Lt flexion x 5; RT hand to LT hip x 5; LT hand to RT hip x 5                              Supine:  ab set 5" hold x 10  Crunch x 10               Heel slide x 10 B               Side lying:               Hip abduction x 10 B                10/12/2021               Standing ab set x 10               Sitting:               Kegal quick flick 2 sec rest 4 x 10               KEgal hold 10 rest 20 x 3               Supine:  ab set 5" hold x 10                Crunch x 5                Heelslide x 5 B               Side lying:               Hip abduction x 5 B   EVAL 09/21/2021 Abdominal set 5 x hold 5 seconds  Kegal 5 seconds rest 10 seconds x 5 Glut set x 5    PATIENT EDUCATION:  Education details: HEP Person educated: Patient Education method: Explanation Education comprehension: verbalized understanding   HOME EXERCISE PROGRAM: Abdominal set 5 x hold 5 seconds  Kegal 5 seconds rest 10 seconds x 5 Glut set x 5              6/1: Supine:  ab set 5" hold x 10                Crunch x 5                Heelslide x 5 B               Side lying:               Hip abduction x 5 B  ASSESSMENT:  CLINICAL IMPRESSION:             PT is now noting a difference.  Feels that she is I with her exercises and does not need to return.  Happy with results so far.   OBJECTIVE IMPAIRMENTS decreased strength and incontinence  .   ACTIVITY LIMITATIONS community activity.   PERSONAL FACTORS Age and Time since onset of injury/illness/exacerbation are also affecting patient's functional outcome.    REHAB POTENTIAL: Fair   CLINICAL DECISION MAKING: Evolving/moderate complexity  EVALUATION COMPLEXITY: Moderate   GOALS: Goals reviewed with patient? No  SHORT TERM GOALS: Target date: 10/05/2021    PT to be I with HEP to note that she is only having to use 4-5 pads/day. Baseline: Goal status: MET   LONG  TERM GOALS: Target date: 10/19/2021               1.  Pt to be I with advanced HEP to not that she is only having to use 3 or less pads /day Goal status: IN PROGRESS  2.  Pt to understand that she is going  to have to complete the exercises for 3-6 months to become      Dry.  Baseline:  Goal status: MET  PLAN: PT FREQUENCY: 1x/week  PT DURATION: 4 weeks  PLANNED INTERVENTIONS: Therapeutic exercises and Patient/Family education  PLAN FOR NEXT SESSION: Add theraband putting exercises, attempt all four exercises but may need to modify to prone hip extension and opposite Arm/leg  Rayetta Humphrey, PT CLT [phone]  11/01/2021,

## 2021-11-15 ENCOUNTER — Encounter (HOSPITAL_COMMUNITY): Payer: Medicare Other | Admitting: Physical Therapy

## 2022-02-02 ENCOUNTER — Other Ambulatory Visit (HOSPITAL_COMMUNITY): Payer: Self-pay | Admitting: Family Medicine

## 2022-02-02 DIAGNOSIS — Z1231 Encounter for screening mammogram for malignant neoplasm of breast: Secondary | ICD-10-CM

## 2022-02-12 ENCOUNTER — Ambulatory Visit: Payer: Medicare Other | Attending: Cardiology | Admitting: Cardiology

## 2022-02-12 ENCOUNTER — Encounter: Payer: Self-pay | Admitting: Cardiology

## 2022-02-12 VITALS — BP 140/80 | HR 67 | Ht 63.0 in | Wt 175.0 lb

## 2022-02-12 DIAGNOSIS — I10 Essential (primary) hypertension: Secondary | ICD-10-CM | POA: Diagnosis not present

## 2022-02-12 DIAGNOSIS — R600 Localized edema: Secondary | ICD-10-CM | POA: Diagnosis not present

## 2022-02-12 NOTE — Patient Instructions (Signed)
Medication Instructions:  Your physician recommends that you continue on your current medications as directed. Please refer to the Current Medication list given to you today.   Labwork: None  Testing/Procedures: None  Follow-Up: Follow up with Dr. Branch in 1 year.   Any Other Special Instructions Will Be Listed Below (If Applicable).     If you need a refill on your cardiac medications before your next appointment, please call your pharmacy.  

## 2022-02-12 NOTE — Progress Notes (Signed)
Clinical Summary [name] is a 81 y.o.female seen today for follow up of the following medical problems.   1.HTN   - reports diagnosed at age 87, she states has always been difficult to control   - bp log 07/2014 with borderline low bp's and heart rates, lopressor decreased to 12.'5mg'$  bid - history of white coat HTN, often clinic bp's elevated while home numbers at goal     - 120s-130s/60s-70s at home.  - off chlorthalidone due to renal dysfunction   -h54m bps 130s/50s - at recent neprhology 134/58 - compliant with meds   2.LE edema - started several years ago - echo LVEF 65-70%, grade I     - denes any recent issues     3. Dynamic LVOT gradient - no symptoms     4. CKD -followed by Dr BTheador Hawthorne    [facility] prev[facility] due to complications of diabetes Past Medical History:  Diagnosis Date   Allergic rhinitis    Arthritis    Asthma    CKD (chronic kidney disease), stage III ([place]    Hypertension    IFG (impaired fasting glucose)    Osteopenia    Spinal stenosis      Allergies  Allergen Reactions   Levaquin [Levofloxacin In D5w]     Sleep disturbance   Levofloxacin     Other reaction(s): GI Upset (intolerance) Sleep disturbance   Tolterodine     Abdominal pain Other reaction(s): Other (See Comments) Abdominal pain Abdominal pain     Current Outpatient Medications  Medication Sig Dispense Refill   acetaminophen (TYLENOL) 325 MG tablet Take 650 mg by mouth every 6 (six) hours as needed.     albuterol (VENTOLIN HFA) 108 (90 Base) MCG/ACT inhaler INHALE TWO PUFFS BY MOUTH EVERY 6 HOURS AS NEEDED FOR WHEEZING 18 each 3   amLODipine (NORVASC) 5 MG tablet Take 5 mg by mouth daily.     aspirin EC 81 MG tablet Take 81 mg by mouth daily.     carboxymethylcellulose (REFRESH PLUS) 0.5 % SOLN Place 1 drop into both eyes 2 (two) times daily as needed. Dry eyes     ciclopirox (PENLAC) 8 % solution Apply one coat to toenail qd for 48 weeks.  Remove  weekly with polish remover. 6.6 mL 11   clobetasol (TEMOVATE) 0.05 % external solution Apply topically 2 (two) times daily as needed.     cloNIDine (CATAPRES) 0.1 MG tablet Take 0.1 mg by mouth at bedtime.     cloNIDine (CATAPRES) 0.2 MG tablet Take 0.2 mg by mouth daily.     furosemide (LASIX) 20 MG tablet Take 20 mg by mouth as directed. Take 40 mg in the AM and Take 20 mg in the PM     furosemide (LASIX) 20 MG tablet Take 20 mg by mouth.     Investigational vitamin D 600 UNITS capsule SWOG S0812 Take 600 Units by mouth daily. Take with food.     lisinopril (ZESTRIL) 5 MG tablet Take 5 mg by mouth daily.     metoprolol tartrate (LOPRESSOR) 25 MG tablet TAKE ONE-HALF (1/2) TABLET TWICE A DAY (DOSE DECREASE) 90 tablet 3   minoxidil (MINOXIDIL FOR WOMEN) 2 % external solution Apply topically 2 (two) times daily. Apply to scalp and allow to dry, and rinse off hands. 60 mL 1   NON F[place] Onychomycosis Nail Lacquer -  Fluconazole 2%, Terbinafine 1% DMSO Apply to affected nail once  daily Qty. 120 gm 3 refills     spironolactone (ALDACTONE) 25 MG tablet Take 0.5 tablets (12.5 mg total) by mouth daily. 7 tablet 0   triamcinolone cream (KENALOG) 0.1 % APPLY CREAM TOPICALLY TO AFFECTED AREA(S) TWICE DAILY 15 g 3   No current facility-administered medications for this visit.     Past Surgical History:  Procedure Laterality Date   ABDOMINAL HYSTERECTOMY     BACK SURGERY     BREAST EXCISIONAL BIOPSY Left    benign   CARPAL TUNNEL RELEASE     CHOLECYSTECTOMY     KNEE ARTHROSCOPY Left    TOTAL ABDOMINAL HYSTERECTOMY W/ BILATERAL SALPINGOOPHORECTOMY       Allergies  Allergen Reactions   Levaquin [Levofloxacin In D5w]     Sleep disturbance   Levofloxacin     Other reaction(s): GI Upset (intolerance) Sleep disturbance   Tolterodine     Abdominal pain Other reaction(s): Other (See Comments) Abdominal pain Abdominal pain      Family History  Problem Relation  Age of Onset   Diabetes Mother    Hypertension Mother    Diabetes Father    Hypertension Father      Social History Ms. Labrie reports that she has never smoked. She has never used smokeless tobacco. [name] has no history on file for alcohol use.   Review of Systems CONSTITUTIONAL: No weight loss, fever, chills, weakness or fatigue.  HEENT: Eyes: No visual loss, blurred vision, double vision or yellow sclerae.No hearing loss, sneezing, congestion, runny nose or sore throat.  SKIN: No rash or itching.  CARDIOVASCULAR: per hpi RESPIRATORY: No shortness of breath, cough or sputum.  GASTROINTESTINAL: No anorexia, nausea, vomiting or diarrhea. No abdominal pain or blood.  GENITOURINARY: No burning on urination, no polyuria NEUROLOGICAL: No headache, dizziness, syncope, paralysis, ataxia, numbness or tingling in the extremities. No change in bowel or bladder control.  MUSCULOSKELETAL: No muscle, back pain, joint pain or stiffness.  LYMPHATICS: No enlarged nodes. No history of splenectomy.  PSYCHIATRIC: No history of depression or anxiety.  ENDOCRINOLOGIC: No reports of sweating, cold or heat intolerance. No polyuria or polydipsia.  Marland Kitchen   Physical Examination Today's Vitals   02/12/22 1056 02/12/22 1123  BP: (!) 180/90 (!) 140/80  Pulse: 67   SpO2: 97%   Weight: 175 lb (79.4 kg)   Height: '5\' 3"'$  (1.6 m)    Body mass index is 31 kg/m.  Gen: resting comfortably, no acute distress HEENT: no scleral icterus, pupils equal round and reactive, no palptable cervical adenopathy,  CV: RRR, no m/r/g no jvd Resp: Clear to auscultation bilaterally GI: abdomen is soft, non-tender, non-distended, normal bowel sounds, no hepatosplenomegaly MSK: extremities are warm, no edema.  Skin: warm, no rash Neuro:  no focal deficits Psych: appropriate affect   Diagnostic Studies  01/2014 echo Study Conclusions  - Left ventricle: The cavity size was normal. Wall thickness was   increased  in a pattern of mild LVH. Systolic function was   vigorous. The estimated ejection fraction was in the range of 65%   to 70%. There was dynamic obstruction during Valsalva in the mid   cavity caused by vigorous LV systolic function, with a peak   velocity of 393 cm/sec and a peak gradient of 62 mm Hg. Doppler   parameters are consistent with abnormal left ventricular   relaxation (grade 1 diastolic dysfunction). - Left atrium: The atrium was mildly dilated. - Pulmonary arteries: PA peak pressure: 39 mm Hg (S).  PASP is   borderline elevated. - Systemic veins: The IVC is small, suggestive of low RA pressure   and hypovolemia. - Technically adequate study.   Assessment and Plan   1. HTN   - elevated here but home numbers at goal, continue current meds. Common pattern for her in the past as well   2. LE edema - no recent issues, continue to monitor  F/u 1 year     Arnoldo Lenis, M.D.

## 2022-03-05 ENCOUNTER — Ambulatory Visit (HOSPITAL_COMMUNITY)
Admission: RE | Admit: 2022-03-05 | Discharge: 2022-03-05 | Disposition: A | Discharge status: Home / Self Care | Payer: Medicare Other | Source: Ambulatory Visit | Attending: Family Medicine | Admitting: Family Medicine

## 2022-03-05 DIAGNOSIS — Z1231 Encounter for screening mammogram for malignant neoplasm of breast: Secondary | ICD-10-CM | POA: Insufficient documentation

## 2022-03-10 ENCOUNTER — Ambulatory Visit (INDEPENDENT_AMBULATORY_CARE_PROVIDER_SITE_OTHER): Payer: Medicare Other | Admitting: *Deleted

## 2022-03-10 DIAGNOSIS — Z23 Encounter for immunization: Secondary | ICD-10-CM

## 2022-05-09 ENCOUNTER — Other Ambulatory Visit: Payer: Self-pay | Admitting: Cardiology

## 2022-05-24 NOTE — Progress Notes (Signed)
Subjective:   SHELIAH FIORILLO is a 82 y.o. female who presents for Medicare Annual (Subsequent) preventive examination.  Review of Systems     Cardiac Risk Factors include: advanced age (>21mn, >>77women);hypertension;sedentary lifestyle     Objective:    Today's Vitals   05/25/22 0958  BP: 138/64  Weight: 178 lb (80.7 kg)  Height: '5\' 3"'$  (1.6 m)   Body mass index is 31.53 kg/m.     05/25/2022   10:06 AM 09/21/2021    2:09 PM 04/25/2021    2:47 PM  Advanced Directives  Does Patient Have a Medical Advance Directive? No No No  Would patient like information on creating a medical advance directive? Yes (MAU/Ambulatory/Procedural Areas - Information given) No - Guardian declined No - Patient declined    Current Medications (verified) Outpatient Encounter Medications as of 05/25/2022  Medication Sig   albuterol (VENTOLIN HFA) 108 (90 Base) MCG/ACT inhaler INHALE TWO PUFFS BY MOUTH EVERY 6 HOURS AS NEEDED FOR WHEEZING   amLODipine (NORVASC) 5 MG tablet Take 5 mg by mouth daily.   aspirin EC 81 MG tablet Take 81 mg by mouth daily.   clobetasol (TEMOVATE) 0.05 % external solution Apply topically 2 (two) times daily as needed.   cloNIDine (CATAPRES) 0.2 MG tablet 0.2 mg 3 (three) times daily.   furosemide (LASIX) 20 MG tablet Take 20 mg by mouth as directed. Take 40 mg in the AM and Take 20 mg in the PM   Investigational vitamin D 600 UNITS capsule SWOG S0812 Take 600 Units by mouth daily. Take with food.   lisinopril (ZESTRIL) 5 MG tablet Take 5 mg by mouth daily.   metoprolol tartrate (LOPRESSOR) 25 MG tablet TAKE ONE-HALF TABLET BY MOUTH  TWICE DAILY (DOSE DECREASE)   spironolactone (ALDACTONE) 25 MG tablet TAKE ONE-HALF TABLET BY MOUTH  DAILY   triamcinolone cream (KENALOG) 0.1 % APPLY CREAM TOPICALLY TO AFFECTED AREA(S) TWICE DAILY   [DISCONTINUED] carboxymethylcellulose (REFRESH PLUS) 0.5 % SOLN Place 1 drop into both eyes 2 (two) times daily as needed. Dry eyes    [DISCONTINUED] cloNIDine (CATAPRES) 0.2 MG tablet TAKE 1 TABLET BY MOUTH IN THE  MORNING AND ONE-HALF TABLET BY  MOUTH IN THE EVENING   [DISCONTINUED] acetaminophen (TYLENOL) 325 MG tablet Take 650 mg by mouth every 6 (six) hours as needed.   [DISCONTINUED] ciclopirox (PENLAC) 8 % solution Apply one coat to toenail qd for 48 weeks.  Remove weekly with polish remover.   [DISCONTINUED] cloNIDine (CATAPRES) 0.1 MG tablet Take 0.1 mg by mouth at bedtime.   [DISCONTINUED] furosemide (LASIX) 20 MG tablet Take 20 mg by mouth.   [DISCONTINUED] minoxidil (MINOXIDIL FOR WOMEN) 2 % external solution Apply topically 2 (two) times daily. Apply to scalp and allow to dry, and rinse off hands.   [DISCONTINUED] NON FORMULARY [facility]  Onychomycosis Nail Lacquer -  Fluconazole 2%, Terbinafine 1% DMSO Apply to affected nail once daily Qty. 120 gm 3 refills   No facility-administered encounter medications on file as of 05/25/2022.    Allergies (verified) Levaquin [levofloxacin in d5w], Levofloxacin, and Tolterodine   History: Past Medical History:  Diagnosis Date   Allergic rhinitis    Arthritis    Asthma    CKD (chronic kidney disease), stage III (HCC)    Hypertension    IFG (impaired fasting glucose)    Osteopenia    Spinal stenosis    Past Surgical History:  Procedure Laterality Date   ABDOMINAL HYSTERECTOMY  BACK SURGERY     BREAST EXCISIONAL BIOPSY Left    benign   CARPAL TUNNEL RELEASE     CHOLECYSTECTOMY     KNEE ARTHROSCOPY Left    TOTAL ABDOMINAL HYSTERECTOMY W/ BILATERAL SALPINGOOPHORECTOMY     Family History  Problem Relation Age of Onset   Diabetes Mother    Hypertension Mother    Diabetes Father    Hypertension Father    Social History   Socioeconomic History   Marital status: Married    Spouse name: Not on file   Number of children: 4   Years of education: Not on file   Highest education level: Not on file  Occupational History   Not on file  Tobacco  Use   Smoking status: Never   Smokeless tobacco: Never  Vaping Use   Vaping Use: Never used  Substance and Sexual Activity   Alcohol use: Not on file   Drug use: Never   Sexual activity: Not on file  Other Topics Concern   Not on file  Social History Narrative   Married x 56 years in Aug 12, 2020.   1 son deceased at age 62.   71 living children.   Social Determinants of Health   Financial Resource Strain: Low Risk  (05/25/2022)   Overall Financial Resource Strain (CARDIA)    Difficulty of Paying Living Expenses: Not hard at all  Food Insecurity: No Food Insecurity (05/25/2022)   Hunger Vital Sign    Worried About Running Out of Food in the Last Year: Never true    Ran Out of Food in the Last Year: Never true  Transportation Needs: No Transportation Needs (05/25/2022)   PRAPARE - Hydrologist (Medical): No    Lack of Transportation (Non-Medical): No  Physical Activity: Inactive (05/25/2022)   Exercise Vital Sign    Days of Exercise per Week: 0 days    Minutes of Exercise per Session: 0 min  Stress: No Stress Concern Present (05/25/2022)   [place]    Feeling of Stress : Not at all  Social Connections: Moderately Isolated (05/25/2022)   Social Connection and Isolation Panel [NHANES]    Frequency of Communication with Friends and Family: More than three times a week    Frequency of Social Gatherings with Friends and Family: Three times a week    Attends Religious Services: Never    Active Member of Clubs or Organizations: No    Attends Music therapist: Never    Marital Status: Married    Tobacco Counseling Counseling given: Not Answered   Clinical Intake:  Pre-visit preparation completed: Yes  Pain : No/denies pain     Diabetes: No  How often do you need to have someone help you when you read instructions, pamphlets, or other written materials from your doctor or  pharmacy?: 1 - Never  Diabetic?No  Interpreter Needed?: No  Information entered by :: Denman George LPN   Activities of Daily Living    05/25/2022   10:07 AM  In your present state of health, do you have any difficulty performing the following activities:  Hearing? 0  Vision? 0  Difficulty concentrating or making decisions? 0  Walking or climbing stairs? 0  Dressing or bathing? 0  Doing errands, shopping? 0  Preparing Food and eating ? N  Using the Toilet? N  In the past six months, have you accidently leaked urine? N  Do you have  problems with loss of bowel control? N  Managing your Medications? N  Managing your Finances? N  Housekeeping or managing your Housekeeping? N    Patient Care Team: Coral Spikes, DO as PCP - General (Family Medicine) Harl Bowie Alphonse Guild, MD as Consulting Physician (Cardiology) Liana Gerold, MD as Consulting Physician (Nephrology) Rutherford Guys, MD as Consulting Physician (Ophthalmology)  Indicate any recent Medical Services you may have received from other than [facility] providers in the past year (date may be approximate).     Assessment:   This is a routine wellness examination for Ama.  Hearing/Vision screen Hearing Screening - Comments:: Denies hearing difficulties  Vision Screening - Comments:: Wears rx glasses - up to date with routine eye exams with Dr. Gershon Crane    Dietary issues and exercise activities discussed: Current Exercise Habits: The patient does not participate in regular exercise at present   Goals Addressed             This Visit's Progress    Maintain Independence         Depression Screen    05/25/2022   10:15 AM 04/25/2021    2:44 PM 01/10/2021    2:19 PM 09/13/2020    1:13 PM 03/16/2020    1:45 PM 08/22/2017   10:10 AM  PHQ 2/9 Scores  PHQ - 2 Score 0 0 0 0 0 0    Fall Risk    05/25/2022   10:13 AM 04/25/2021    2:48 PM 01/10/2021    2:18 PM 09/13/2020    1:12 PM 03/16/2020    1:45 PM  [place] in the past year? 0 0 0 0 0  Number falls in past yr: 0 0 0 0   Injury with Fall? 0 0 0 0   Risk for fall due to : No Fall Risks Impaired balance/gait;Impaired mobility No Fall Risks No Fall Risks   Follow up Falls evaluation completed;Education provided;Falls prevention discussed Falls prevention discussed Falls evaluation completed Falls evaluation completed Falls evaluation completed    FALL RISK PREVENTION PERTAINING TO THE HOME:  Any stairs in or around the home? No  If so, are there any without handrails? No  Home free of loose throw rugs in walkways, pet beds, electrical cords, etc? Yes  Adequate lighting in your home to reduce risk of falls? Yes   ASSISTIVE DEVICES UTILIZED TO PREVENT FALLS:  Life alert? No  Use of a cane, walker or w/c? Yes  Grab bars in the bathroom? Yes  Shower chair or bench in shower? No  Elevated toilet seat or a handicapped toilet? Yes   TIMED UP AND GO:  Was the test performed? Yes .  Length of time to ambulate 10 feet: 5 sec.   Gait slow and steady with assistive device  Cognitive Function:        05/25/2022   10:13 AM 04/25/2021    2:54 PM  6CIT Screen  What Year? 0 points 0 points  What month? 0 points 0 points  What time? 0 points 0 points  Count back from 20 0 points 0 points  Months in reverse 0 points 0 points  Repeat phrase 0 points 0 points  Total Score 0 points 0 points    Immunizations Immunization History  Administered Date(s) Administered   Fluad Quad(high Dose 65+) 01/27/2019, 03/10/2022   Influenza Split 02/24/2013   Influenza,inj,Quad PF,6+ Mos 02/02/2014, 02/21/2015, 02/14/2016, 02/21/2017, 02/19/2018   Influenza-Unspecified 02/12/2011, 01/26/2015, 02/25/2021  Moderna Covid-19 Vaccine Bivalent Booster 17yr & up 03/30/2021   Moderna SARS-COV2 Booster Vaccination 03/19/2020   Moderna Sars-Covid-2 Vaccination 05/26/2019, 07/06/2019   Pneumococcal Conjugate-13 02/21/2015   Pneumococcal Polysaccharide-23  02/12/2003, 05/14/2010   RSV,unspecified 02/28/2022   Unspecified SARS-COV-2 Vaccination 02/28/2022   Zoster, Live 06/12/2013    TDAP status: Due, Education has been provided regarding the importance of this vaccine. Advised may receive this vaccine at local pharmacy or Health Dept. Aware to provide a copy of the vaccination record if obtained from local pharmacy or Health Dept. Verbalized acceptance and understanding.  Flu Vaccine status: Up to date  Pneumococcal vaccine status: Up to date  Covid-19 vaccine status: Information provided on how to obtain vaccines.   Qualifies for Shingles Vaccine? Yes   Zostavax completed No   Shingrix Completed?: No.    Education has been provided regarding the importance of this vaccine. Patient has been advised to call insurance company to determine out of pocket expense if they have not yet received this vaccine. Advised may also receive vaccine at local pharmacy or Health Dept. Verbalized acceptance and understanding.  Screening Tests Health Maintenance  Topic Date Due   DTaP/Tdap/Td (1 - Tdap) Never done   Zoster Vaccines- Shingrix (1 of 2) Never done   COVID-19 Vaccine (5 - 2023-24 season) 04/25/2022   Medicare Annual Wellness (AWV)  05/26/2023   Pneumonia Vaccine 82 Years old  Completed   INFLUENZA VACCINE  Completed   DEXA SCAN  Completed   HPV VACCINES  Aged Out    Health Maintenance  Health Maintenance Due  Topic Date Due   DTaP/Tdap/Td (1 - Tdap) Never done   Zoster Vaccines- Shingrix (1 of 2) Never done   COVID-19 Vaccine (5 - 2023-24 season) 04/25/2022    Colorectal cancer screening: No longer required.   Mammogram status: No longer required due to age.  Bone Density status: Completed 01/05/10. Results reflect: Bone density results: OSTEOPENIA. Repeat every 2 years.  Lung Cancer Screening: (Low Dose CT Chest recommended if Age 82-80years, 30 pack-year currently smoking OR have quit w/in 15years.) does not qualify.   Lung  Cancer Screening Referral: n/a   Additional Screening:  Hepatitis C Screening: does not qualify  Vision Screening: Recommended annual ophthalmology exams for early detection of glaucoma and other disorders of the eye. Is the patient up to date with their annual eye exam?  Yes  Who is the provider or what is the name of the office in which the patient attends annual eye exams? Dr. SGershon CraneIf pt is not established with a provider, would they like to be referred to a provider to establish care? No .   Dental Screening: Recommended annual dental exams for proper oral hygiene  Community Resource Referral / Chronic Care Management: CRR required this visit?  No   CCM required this visit?  No      Plan:     I have personally reviewed and noted the following in the patient's chart:   Medical and social history Use of alcohol, tobacco or illicit drugs  Current medications and supplements including opioid prescriptions. Patient is not currently taking opioid prescriptions. Functional ability and status Nutritional status Physical activity Advanced directives List of other physicians Hospitalizations, surgeries, and ER visits in previous 12 months Vitals Screenings to include cognitive, depression, and falls Referrals and appointments  In addition, I have reviewed and discussed with patient certain preventive protocols, quality metrics, and best practice recommendations. A written personalized care plan for  preventive services as well as general preventive health recommendations were provided to patient.     Denman George [place], [place]   [id number]   Nurse Notes: No concerns

## 2022-05-24 NOTE — Patient Instructions (Signed)
[name] , Thank you for taking time to come for your Medicare Wellness Visit. I appreciate your ongoing commitment to your health goals. Please review the following plan we discussed and let me know if I can assist you in the future.   These are the goals we discussed:  Goals      Exercise 3x per week (30 min per time)     Would like to continue to exercise and stay healthy. Would like to move to [place].     Maintain Independence        This is a list of the screening recommended for you and due dates:  Health Maintenance  Topic Date Due   DTaP/Tdap/Td vaccine (1 - Tdap) Never done   Zoster (Shingles) Vaccine (1 of 2) Never done   COVID-19 Vaccine (5 - 2023-24 season) 04/25/2022   Medicare Annual Wellness Visit  05/26/2023   Pneumonia Vaccine  Completed   Flu Shot  Completed   DEXA scan (bone density measurement)  Completed   HPV Vaccine  Aged Out    Advanced directives: Advance directive discussed with you today. I have provided a copy for you to complete at home and have notarized. Once this is complete please bring a copy in to our office so we can scan it into your chart.   Conditions/risks identified: Aim for 30 minutes of exercise or brisk walking, 6-8 glasses of water, and 5 servings of fruits and vegetables each day.   Next appointment: Follow up in one year for your annual wellness visit    Preventive Care 65 Years and Older, Female Preventive care refers to lifestyle choices and visits with your health care provider that can promote health and wellness. What does preventive care include? A yearly physical exam. This is also called an annual well check. Dental exams once or twice a year. Routine eye exams. Ask your health care provider how often you should have your eyes checked. Personal lifestyle choices, including: Daily care of your teeth and gums. Regular physical activity. Eating a healthy diet. Avoiding tobacco and drug use. Limiting alcohol  use. Practicing safe sex. Taking low-dose aspirin every day. Taking vitamin and mineral supplements as recommended by your health care provider. What happens during an annual well check? The services and screenings done by your health care provider during your annual well check will depend on your age, overall health, lifestyle risk factors, and family history of disease. Counseling  Your health care provider may ask you questions about your: Alcohol use. Tobacco use. Drug use. Emotional well-being. Home and relationship well-being. Sexual activity. Eating habits. History of falls. Memory and ability to understand (cognition). Work and work Statistician. Reproductive health. Screening  You may have the following tests or measurements: Height, weight, and BMI. Blood pressure. Lipid and cholesterol levels. These may be checked every 5 years, or more frequently if you are over 27 years old. Skin check. Lung cancer screening. You may have this screening every year starting at age 37 if you have a 30-pack-year history of smoking and currently smoke or have quit within the past 15 years. Fecal occult blood test (FOBT) of the stool. You may have this test every year starting at age 42. Flexible sigmoidoscopy or colonoscopy. You may have a sigmoidoscopy every 5 years or a colonoscopy every 10 years starting at age 16. Hepatitis C blood test. Hepatitis B blood test. Sexually transmitted disease (STD) testing. Diabetes screening. This is done by checking your blood sugar (glucose)  after you have not eaten for a while (fasting). You may have this done every 1-3 years. Bone density scan. This is done to screen for osteoporosis. You may have this done starting at age 52. Mammogram. This may be done every 1-2 years. Talk to your health care provider about how often you should have regular mammograms. Talk with your health care provider about your test results, treatment options, and if necessary,  the need for more tests. Vaccines  Your health care provider may recommend certain vaccines, such as: Influenza vaccine. This is recommended every year. Tetanus, diphtheria, and acellular pertussis (Tdap, Td) vaccine. You may need a Td booster every 10 years. Zoster vaccine. You may need this after age 31. Pneumococcal 13-valent conjugate (PCV13) vaccine. One dose is recommended after age 19. Pneumococcal polysaccharide (PPSV23) vaccine. One dose is recommended after age 56. Talk to your health care provider about which screenings and vaccines you need and how often you need them. This information is not intended to replace advice given to you by your health care provider. Make sure you discuss any questions you have with your health care provider. Document Released: 05/27/2015 Document Revised: 01/18/2016 Document Reviewed: 03/01/2015 Elsevier Interactive Patient Education  2017 [employer] Prevention in the Home Falls can cause injuries. They can happen to people of all ages. There are many things you can do to make your home safe and to help prevent falls. What can I do on the outside of my home? Regularly fix the edges of walkways and driveways and fix any cracks. Remove anything that might make you trip as you walk through a door, such as a raised step or threshold. Trim any bushes or trees on the path to your home. Use bright outdoor lighting. Clear any walking paths of anything that might make someone trip, such as rocks or tools. Regularly check to see if handrails are loose or broken. Make sure that both sides of any steps have handrails. Any raised decks and porches should have guardrails on the edges. Have any leaves, snow, or ice cleared regularly. Use sand or salt on walking paths during winter. Clean up any spills in your garage right away. This includes oil or grease spills. What can I do in the bathroom? Use night lights. Install grab bars by the toilet and in the  tub and shower. Do not use towel bars as grab bars. Use non-skid mats or decals in the tub or shower. If you need to sit down in the shower, use a plastic, non-slip stool. Keep the floor dry. Clean up any water that spills on the floor as soon as it happens. Remove soap buildup in the tub or shower regularly. Attach bath mats securely with double-sided non-slip rug tape. Do not have throw rugs and other things on the floor that can make you trip. What can I do in the bedroom? Use night lights. Make sure that you have a light by your bed that is easy to reach. Do not use any sheets or blankets that are too big for your bed. They should not hang down onto the floor. Have a firm chair that has side arms. You can use this for support while you get dressed. Do not have throw rugs and other things on the floor that can make you trip. What can I do in the kitchen? Clean up any spills right away. Avoid walking on wet floors. Keep items that you use a lot in easy-to-reach places. If  you need to reach something above you, use a strong step stool that has a grab bar. Keep electrical cords out of the way. Do not use floor polish or wax that makes floors slippery. If you must use wax, use non-skid floor wax. Do not have throw rugs and other things on the floor that can make you trip. What can I do with my stairs? Do not leave any items on the stairs. Make sure that there are handrails on both sides of the stairs and use them. Fix handrails that are broken or loose. Make sure that handrails are as long as the stairways. Check any carpeting to make sure that it is firmly attached to the stairs. Fix any carpet that is loose or worn. Avoid having throw rugs at the top or bottom of the stairs. If you do have throw rugs, attach them to the floor with carpet tape. Make sure that you have a light switch at the top of the stairs and the bottom of the stairs. If you do not have them, ask someone to add them for  you. What else can I do to help prevent falls? Wear shoes that: Do not have high heels. Have rubber bottoms. Are comfortable and fit you well. Are closed at the toe. Do not wear sandals. If you use a stepladder: Make sure that it is fully opened. Do not climb a closed stepladder. Make sure that both sides of the stepladder are locked into place. Ask someone to hold it for you, if possible. Clearly mark and make sure that you can see: Any grab bars or handrails. First and last steps. Where the edge of each step is. Use tools that help you move around (mobility aids) if they are needed. These include: Canes. Walkers. Scooters. Crutches. Turn on the lights when you go into a dark area. Replace any light bulbs as soon as they burn out. Set up your furniture so you have a clear path. Avoid moving your furniture around. If any of your floors are uneven, fix them. If there are any pets around you, be aware of where they are. Review your medicines with your doctor. Some medicines can make you feel dizzy. This can increase your chance of falling. Ask your doctor what other things that you can do to help prevent falls. This information is not intended to replace advice given to you by your health care provider. Make sure you discuss any questions you have with your health care provider. Document Released: 02/24/2009 Document Revised: 10/06/2015 Document Reviewed: 06/04/2014 Elsevier Interactive Patient Education  2017 [name].

## 2022-05-25 ENCOUNTER — Ambulatory Visit (INDEPENDENT_AMBULATORY_CARE_PROVIDER_SITE_OTHER): Payer: Medicare Other

## 2022-05-25 VITALS — BP 138/64 | Ht 63.0 in | Wt 178.0 lb

## 2022-05-25 DIAGNOSIS — Z0000 Encounter for general adult medical examination without abnormal findings: Secondary | ICD-10-CM | POA: Diagnosis not present

## 2022-06-19 ENCOUNTER — Ambulatory Visit (INDEPENDENT_AMBULATORY_CARE_PROVIDER_SITE_OTHER): Payer: Medicare Other | Admitting: Family Medicine

## 2022-06-19 DIAGNOSIS — R0602 Shortness of breath: Secondary | ICD-10-CM | POA: Insufficient documentation

## 2022-06-19 DIAGNOSIS — I10 Essential (primary) hypertension: Secondary | ICD-10-CM

## 2022-06-19 DIAGNOSIS — N1832 Chronic kidney disease, stage 3b: Secondary | ICD-10-CM | POA: Diagnosis not present

## 2022-06-19 DIAGNOSIS — E785 Hyperlipidemia, unspecified: Secondary | ICD-10-CM | POA: Insufficient documentation

## 2022-06-19 DIAGNOSIS — R7303 Prediabetes: Secondary | ICD-10-CM | POA: Insufficient documentation

## 2022-06-19 DIAGNOSIS — N183 Chronic kidney disease, stage 3 unspecified: Secondary | ICD-10-CM | POA: Insufficient documentation

## 2022-06-19 MED ORDER — ALBUTEROL SULFATE HFA 108 (90 BASE) MCG/ACT IN AERS: INHALATION_SPRAY | RESPIRATORY_TRACT | 1 refills | Status: DC

## 2022-06-19 NOTE — Progress Notes (Signed)
Subjective:  Patient ID: [name], female    DOB: Jun 03, 1940  Age: 82 y.o. MRN: [id number]  CC: Chief Complaint  Patient presents with   Establish Care    hypertension    HPI:  82 year old female with hypertension, CKD, prediabetes, hyperlipidemia presents for follow-up.  Patient is establishing with me as our nurse practitioner has left the office.  Patient's blood pressure is elevated today.  Her blood pressures tend to be elevated when she is in physician's offices.  Her home blood pressure readings are well-controlled.  She is followed by cardiology and also by nephrology.  She is on amlodipine 5 mg daily, clonidine point 2 mg 3 times daily, Lasix 40 mg in the a.m. and 20 mg in the p.m., lisinopril 5 mg daily, metoprolol 25 mg twice daily, and spironolactone 12.5 mg daily.  Patient states that she is doing well.  No chest pain or shortness of breath.  Patient does report that she needs refill on her albuterol inhaler.  Patient Active Problem List   Diagnosis Date Noted   CKD (chronic kidney disease) stage 3, GFR 30-59 ml/min (HCC) 06/19/2022   Prediabetes 06/19/2022   Hyperlipidemia 06/19/2022   SOB (shortness of breath) 06/19/2022   HTN (hypertension) 08/26/2012    Social Hx   Social History   Socioeconomic History   Marital status: Married    Spouse name: Not on file   Number of children: 4   Years of education: Not on file   Highest education level: Not on file  Occupational History   Not on file  Tobacco Use   Smoking status: Never   Smokeless tobacco: Never  Vaping Use   Vaping Use: Never used  Substance and Sexual Activity   Alcohol use: Not on file   Drug use: Never   Sexual activity: Not on file  Other Topics Concern   Not on file  Social History Narrative   Married x 56 years in 08-10-2020.   1 son deceased at age 69.   84 living children.   Social Determinants of Health   Financial Resource Strain: Low Risk  (05/25/2022)   Overall Financial  Resource Strain (CARDIA)    Difficulty of Paying Living Expenses: Not hard at all  Food Insecurity: No Food Insecurity (05/25/2022)   Hunger Vital Sign    Worried About Running Out of Food in the Last Year: Never true    Ran Out of Food in the Last Year: Never true  Transportation Needs: No Transportation Needs (05/25/2022)   PRAPARE - Hydrologist (Medical): No    Lack of Transportation (Non-Medical): No  Physical Activity: Inactive (05/25/2022)   Exercise Vital Sign    Days of Exercise per Week: 0 days    Minutes of Exercise per Session: 0 min  Stress: No Stress Concern Present (05/25/2022)   [place]    Feeling of Stress : Not at all  Social Connections: Moderately Isolated (05/25/2022)   Social Connection and Isolation Panel [NHANES]    Frequency of Communication with Friends and Family: More than three times a week    Frequency of Social Gatherings with Friends and Family: Three times a week    Attends Religious Services: Never    Active Member of Clubs or Organizations: No    Attends Archivist Meetings: Never    Marital Status: Married    Review of Systems Per HPI  Objective:  BP (!) 158/69   Pulse 72   Temp (!) 97.3 F (36.3 C)   Ht '5\' 3"'$  (1.6 m)   Wt 176 lb (79.8 kg)   SpO2 99%   BMI 31.18 kg/m      06/19/2022    1:14 PM 06/19/2022    1:11 PM 05/25/2022    9:58 AM  BP/Weight  Systolic BP 676 195 093  Diastolic BP 69 70 64  Wt. (Lbs)  176 178  BMI  31.18 kg/m2 31.53 kg/m2    Physical Exam Vitals and nursing note reviewed.  Constitutional:      General: She is not in acute distress.    Appearance: Normal appearance. She is obese.  HENT:     Head: Normocephalic and atraumatic.  Cardiovascular:     Rate and Rhythm: Normal rate and regular rhythm.  Pulmonary:     Effort: Pulmonary effort is normal.     Breath sounds: Normal breath sounds. No wheezing,  rhonchi or rales.  Neurological:     Mental Status: She is alert.  Psychiatric:        Mood and Affect: Mood normal.        Behavior: Behavior normal.     Lab Results  Component Value Date   WBC 8.7 08/14/2021   HGB 13.3 08/14/2021   HCT 40.6 08/14/2021   PLT 368 08/14/2021   GLUCOSE 106 (H) 08/14/2021   CHOL 191 08/14/2021   TRIG 103 08/14/2021   HDL 60 08/14/2021   LDLCALC 113 (H) 08/14/2021   ALT 13 08/14/2021   AST 23 08/14/2021   NA 139 08/14/2021   K 4.2 08/14/2021   CL 101 08/14/2021   CREATININE 1.27 (H) 08/14/2021   BUN 27 08/14/2021   CO2 22 08/14/2021   TSH 1.050 08/14/2021   INR 1.13 09/27/2010   HGBA1C 6.1 (H) 08/14/2021     Assessment & Plan:   Problem List Items Addressed This Visit       Cardiovascular and Mediastinum   HTN (hypertension)    Home blood sugar readings are well-controlled.  Continue her current medications.  She is monitored closely by nephrology.        Genitourinary   CKD (chronic kidney disease) stage 3, GFR 30-59 ml/min (HCC)     Other   SOB (shortness of breath)    Albuterol refilled.       Meds ordered this encounter  Medications   albuterol (VENTOLIN HFA) 108 (90 Base) MCG/ACT inhaler    Sig: INHALE TWO PUFFS BY MOUTH EVERY 6 HOURS AS NEEDED FOR WHEEZING    Dispense:  18 each    Refill:  1    Follow-up:  Return in about 6 months (around 12/18/2022).  [place]

## 2022-06-19 NOTE — Assessment & Plan Note (Signed)
-   Albuterol refilled.

## 2022-06-19 NOTE — Assessment & Plan Note (Signed)
Home blood sugar readings are well-controlled.  Continue her current medications.  She is monitored closely by nephrology.

## 2022-06-19 NOTE — Patient Instructions (Signed)
Keep a close eye on your blood pressures.  Continue current medications.  Follow-up in 6 months.

## 2022-08-01 ENCOUNTER — Other Ambulatory Visit (HOSPITAL_COMMUNITY): Payer: Self-pay | Admitting: Nephrology

## 2022-08-01 DIAGNOSIS — N1832 Chronic kidney disease, stage 3b: Secondary | ICD-10-CM

## 2022-08-01 DIAGNOSIS — N179 Acute kidney failure, unspecified: Secondary | ICD-10-CM

## 2022-08-02 ENCOUNTER — Ambulatory Visit (HOSPITAL_COMMUNITY)
Admission: RE | Admit: 2022-08-02 | Discharge: 2022-08-02 | Disposition: A | Discharge status: Home / Self Care | Payer: Medicare Other | Source: Ambulatory Visit | Attending: Vascular Surgery | Admitting: Vascular Surgery

## 2022-08-02 DIAGNOSIS — N1832 Chronic kidney disease, stage 3b: Secondary | ICD-10-CM | POA: Diagnosis present

## 2022-08-03 ENCOUNTER — Encounter: Payer: Self-pay | Admitting: Nurse Practitioner

## 2022-08-03 ENCOUNTER — Ambulatory Visit: Payer: Medicare Other | Attending: Nurse Practitioner | Admitting: Nurse Practitioner

## 2022-08-03 VITALS — BP 150/80 | HR 68 | Ht 63.0 in | Wt 176.0 lb

## 2022-08-03 DIAGNOSIS — N1832 Chronic kidney disease, stage 3b: Secondary | ICD-10-CM

## 2022-08-03 DIAGNOSIS — I10 Essential (primary) hypertension: Secondary | ICD-10-CM

## 2022-08-03 DIAGNOSIS — I701 Atherosclerosis of renal artery: Secondary | ICD-10-CM

## 2022-08-03 DIAGNOSIS — R000 Tachycardia, unspecified: Secondary | ICD-10-CM

## 2022-08-03 DIAGNOSIS — R002 Palpitations: Secondary | ICD-10-CM | POA: Diagnosis not present

## 2022-08-03 NOTE — Patient Instructions (Signed)
Medication Instructions:  Your physician recommends that you continue on your current medications as directed. Please refer to the Current Medication list given to you today.  Labwork: none  Testing/Procedures: none  Follow-Up: Your physician recommends that you schedule a follow-up appointment in: 4-6 weeks  Any Other Special Instructions Will Be Listed Below (If Applicable).  If you need a refill on your cardiac medications before your next appointment, please call your pharmacy. 

## 2022-08-03 NOTE — Progress Notes (Unsigned)
Office Visit    Patient Name: [name] Date of Encounter: 08/03/2022  PCP:  [facility]  Cardiologist:  Carlyle Dolly, MD  Advanced Practice Provider:  Finis Bud, NP Electrophysiologist:  None  [phone]  Chief Complaint    [name] is a 82 y.o. female with a hx of hypertension, leg edema, chronic kidney disease, renal artery stenosis, and asthma, who presents today for tachycardia evaluation.  Past Medical History    Past Medical History:  Diagnosis Date   Allergic rhinitis    Arthritis    Asthma    CKD (chronic kidney disease), stage III ([place])    Hypertension    IFG (impaired fasting glucose)    Osteopenia    Spinal stenosis    Past Surgical History:  Procedure Laterality Date   ABDOMINAL HYSTERECTOMY     BACK SURGERY     BREAST EXCISIONAL BIOPSY Left    benign   CARPAL TUNNEL RELEASE     CHOLECYSTECTOMY     KNEE ARTHROSCOPY Left    TOTAL ABDOMINAL HYSTERECTOMY W/ BILATERAL SALPINGOOPHORECTOMY      Allergies  Allergies  Allergen Reactions   Levaquin [Levofloxacin In D5w]     Sleep disturbance   Levofloxacin     Other reaction(s): GI Upset (intolerance) Sleep disturbance   Tolterodine     Abdominal pain Other reaction(s): Other (See Comments) Abdominal pain Abdominal pain    History of Present Illness    [name] is a 82 y.o. female with a PMH as mentioned above.  Last seen by Dr. Carlyle Dolly on February 12, 2022.  Blood pressure was overall well-controlled at home.  Was compliant with her medications, followed by Dr. Theador Hawthorne for CKD.  Today she presents for evaluation for tachycardia.  She states she has had 3 episodes of "racing heart" noticed in last 2 months. No specific triggers identified, lasting around 20 minutes in duration, typically noticed at night time. Denies any other associated symptoms. Denies any chest pain, shortness of breath,  syncope, presyncope,  dizziness, orthopnea, PND, swelling or significant weight changes, acute bleeding, or claudication. Does drink about 3 cups of coffee per day. Says she is a night owl and doesn't always get a full night's sleep. Does admit to stress.   EKGs/Labs/Other Studies Reviewed:   The following studies were reviewed today:   EKG:  EKG is ordered today.  The ekg ordered today demonstrates NSR, 67 bpm, no acute ischemic changes.    Renal artery bilateral duplex on 08/02/2022: Summary:  Renal:    Right: 1-59% stenosis of the right renal artery. Normal size right         kidney.  Left:  1-59% stenosis of the left renal artery. Normal size of left         kidney.           3.9 x 3.8 cm cyst lower pole visualized.           NOTE: This was a technically difficult exam, limited         visability.    Echo on 01/22/2014: - Left ventricle: The cavity size was normal. Wall thickness was    increased in a pattern of mild LVH. Systolic function was    vigorous. The estimated ejection fraction was in the range of 65%    to 70%. There was dynamic obstruction during Valsalva in the mid    cavity caused  by vigorous LV systolic function, with a peak    velocity of 393 cm/sec and a peak gradient of 62 mm Hg. Doppler    parameters are consistent with abnormal left ventricular    relaxation (grade 1 diastolic dysfunction).  - Left atrium: The atrium was mildly dilated.  - Pulmonary arteries: PA peak pressure: 39 mm Hg (S). PASP is    borderline elevated.  - Systemic veins: The IVC is small, suggestive of low RA pressure    and hypovolemia.  - Technically adequate study.  Recent Labs: 08/14/2021: ALT 13; BUN 27; Creatinine, Ser 1.27; Hemoglobin 13.3; Platelets 368; Potassium 4.2; Sodium 139; TSH 1.050  Recent Lipid Panel    Component Value Date/Time   CHOL 191 08/14/2021 1359   TRIG 103 08/14/2021 1359   HDL 60 08/14/2021 1359   CHOLHDL 3.2 08/14/2021 1359   CHOLHDL 3.4 02/02/2015 0841   VLDL 19  02/02/2015 0841   LDLCALC 113 (H) 08/14/2021 1359    Home Medications   Current Meds  Medication Sig   albuterol (VENTOLIN HFA) 108 (90 Base) MCG/ACT inhaler INHALE TWO PUFFS BY MOUTH EVERY 6 HOURS AS NEEDED FOR WHEEZING   aspirin EC 81 MG tablet Take 81 mg by mouth daily.   calcitRIOL (ROCALTROL) 0.25 MCG capsule Take 0.25 mcg by mouth 3 (three) times a week. Monday-Wednesday & Friday   cholecalciferol (VITAMIN D3) 25 MCG (1000 UNIT) tablet Take 1,000 Units by mouth daily.   clobetasol (TEMOVATE) 0.05 % external solution Apply topically 2 (two) times daily as needed.   cloNIDine (CATAPRES) 0.2 MG tablet Take 0.2 mg by mouth 3 (three) times daily.   furosemide (LASIX) 20 MG tablet Take 20 mg by mouth as directed. Take 40 mg in the AM and Take 20 mg in the PM   lisinopril (ZESTRIL) 5 MG tablet Take 5 mg by mouth daily.   metoprolol tartrate (LOPRESSOR) 25 MG tablet TAKE ONE-HALF TABLET BY MOUTH  TWICE DAILY (DOSE DECREASE)   NIFEdipine (PROCARDIA XL/NIFEDICAL-XL) 90 MG 24 hr tablet Take 90 mg by mouth daily.   spironolactone (ALDACTONE) 25 MG tablet TAKE ONE-HALF TABLET BY MOUTH  DAILY   triamcinolone cream (KENALOG) 0.1 % APPLY CREAM TOPICALLY TO AFFECTED AREA(S) TWICE DAILY     Review of Systems    All other systems reviewed and are otherwise negative except as noted above.  Physical Exam    VS:  BP (!) 150/80   Pulse 68   Ht 5\' 3"  (1.6 m)   Wt 176 lb (79.8 kg)   SpO2 96%   BMI 31.18 kg/m  , BMI Body mass index is 31.18 kg/m.  Wt Readings from Last 3 Encounters:  08/03/22 176 lb (79.8 kg)  06/19/22 176 lb (79.8 kg)  05/25/22 178 lb (80.7 kg)    Repeat BP 139/62  GEN: Well nourished, well developed, in no acute distress. HEENT: normal. Neck: Supple, no JVD, carotid bruits, or masses. Cardiac: S1/S2, RRR, no murmurs, rubs, or gallops. No clubbing, cyanosis, edema.  Radials/PT 2+ and equal bilaterally.  Respiratory:  Respirations regular and unlabored, clear to  auscultation bilaterally. MS: No deformity or atrophy. Skin: Warm and dry, no rash. Neuro:  Strength and sensation are intact. Psych: Normal affect.  Assessment & Plan    Palpitations, tachycardia Endorses 3 episodes of palpitations/ "racing heart," each brief in duration. HR and EKG unremarkable today. Does admit to caffeine consumption, not bothersome per report. Will monitor at this time and work on lifestyle changes.  Discussed stress relief, caffeine reduction, and sleep hygiene habits. If no improvement by next visit, will consider monitor. Heart healthy diet and regular cardiovascular exercise encouraged.   HTN, renal artery stenosis BP on arrival 150/80. Repeat BP 139/62. Does admit to [facility] and has bilateral 1-59% stenosis bilaterally - follows Nephrology. Continue current medication regimen. Discussed to monitor BP at home at least 2 hours after medications and sitting for 5-10 minutes. Heart healthy diet and regular cardiovascular exercise encouraged. Continue to follow with PCP and Nephrology.  CKD stage 3b Follows Nephrology. Avoid nephrotoxic agents. Encouraged adequate hydration. Heart healthy diet and regular cardiovascular exercise encouraged. Continue to follow with PCP.  Disposition: Follow up in 4-6 week(s) with Carlyle Dolly, MD or APP.  Signed, Finis Bud, NP 08/05/2022, 4:19 PM [facility]

## 2022-09-18 ENCOUNTER — Ambulatory Visit: Payer: Medicare Other | Admitting: Nurse Practitioner

## 2022-12-18 ENCOUNTER — Ambulatory Visit: Payer: Medicare Other | Admitting: Family Medicine

## 2022-12-18 VITALS — BP 159/51 | HR 79 | Temp 98.1°F | Ht 63.0 in | Wt 166.2 lb

## 2022-12-18 DIAGNOSIS — N1832 Chronic kidney disease, stage 3b: Secondary | ICD-10-CM

## 2022-12-18 DIAGNOSIS — K5900 Constipation, unspecified: Secondary | ICD-10-CM | POA: Diagnosis not present

## 2022-12-18 DIAGNOSIS — I10 Essential (primary) hypertension: Secondary | ICD-10-CM

## 2022-12-18 MED ORDER — METOPROLOL TARTRATE 25 MG PO TABS: ORAL_TABLET | ORAL | 3 refills | Status: DC

## 2022-12-18 MED ORDER — NIFEDIPINE ER OSMOTIC RELEASE 90 MG PO TB24: 90.0000 mg | ORAL_TABLET | Freq: Every day | ORAL | 11 refills | Status: AC

## 2022-12-18 MED ORDER — SENNA 8.6 MG PO TABS: 2.0000 | ORAL_TABLET | Freq: Every day | ORAL | 1 refills | Status: AC | PRN

## 2022-12-18 MED ORDER — SPIRONOLACTONE 25 MG PO TABS: 12.5000 mg | ORAL_TABLET | Freq: Every day | ORAL | 3 refills | Status: DC

## 2022-12-18 NOTE — Assessment & Plan Note (Signed)
Stable

## 2022-12-18 NOTE — Assessment & Plan Note (Signed)
Further well-controlled per home readings.  Continue current medications.

## 2022-12-18 NOTE — Patient Instructions (Signed)
Medication as directed.  Fiber rich foods.  Follow-up in 6 months  Dr. Adriana Simas

## 2022-12-18 NOTE — Assessment & Plan Note (Signed)
Senna as needed

## 2022-12-18 NOTE — Progress Notes (Signed)
Subjective:  Patient ID: [name], female    DOB: 06-Oct-1940  Age: 82 y.o. MRN: [id number]  CC: Chief Complaint  Patient presents with   Constipation    HPI:  82 year old female with hypertension, CKD, prediabetes, hyperlipidemia presents for follow-up  Patient's blood pressure was elevated here today.  Her home readings are fairly well-controlled.  She is followed by cardiology as well as nephrology.  She is on multiple agents for hypertension.  Patient reports that she is overall doing well.  No chest pain or shortness of breath.  She does note that she is having issues with constipation.  Has a bowel movement every 3 to 4 days.  She states that she previously used MiraLAX but is no longer using this due to underlying CKD.  Patient Active Problem List   Diagnosis Date Noted   Constipation 12/18/2022   CKD (chronic kidney disease) stage 3, GFR 30-59 ml/min (HCC) 06/19/2022   Prediabetes 06/19/2022   Hyperlipidemia 06/19/2022   HTN (hypertension) 08/26/2012    Social Hx   Social History   Socioeconomic History   Marital status: Married    Spouse name: Not on file   Number of children: 4   Years of education: Not on file   Highest education level: Not on file  Occupational History   Not on file  Tobacco Use   Smoking status: Never   Smokeless tobacco: Never  Vaping Use   Vaping status: Never Used  Substance and Sexual Activity   Alcohol use: Not on file   Drug use: Never   Sexual activity: Not on file  Other Topics Concern   Not on file  Social History Narrative   Married x 56 years in December 31, 2020.   1 son deceased at age 73.   3 living children.   Social Determinants of Health   Financial Resource Strain: Low Risk  (05/25/2022)   Overall Financial Resource Strain (CARDIA)    Difficulty of Paying Living Expenses: Not hard at all  Food Insecurity: No Food Insecurity (05/25/2022)   Hunger Vital Sign    Worried About Running Out of Food in the Last Year:  Never true    Ran Out of Food in the Last Year: Never true  Transportation Needs: No Transportation Needs (05/25/2022)   PRAPARE - Administrator, Civil Service (Medical): No    Lack of Transportation (Non-Medical): No  Physical Activity: Inactive (05/25/2022)   Exercise Vital Sign    Days of Exercise per Week: 0 days    Minutes of Exercise per Session: 0 min  Stress: No Stress Concern Present (05/25/2022)   [employer] of Occupational Health - Occupational Stress Questionnaire    Feeling of Stress : Not at all  Social Connections: Moderately Isolated (05/25/2022)   Social Connection and Isolation Panel [NHANES]    Frequency of Communication with Friends and Family: More than three times a week    Frequency of Social Gatherings with Friends and Family: Three times a week    Attends Religious Services: Never    Active Member of Clubs or Organizations: No    Attends Engineer, structural: Never    Marital Status: Married    Review of Systems Per HPI  Objective:  BP (!) 159/51   Pulse 79   Temp 98.1 F (36.7 C)   Ht 5\' 3"  (1.6 m)   Wt 166 lb 3.2 oz (75.4 kg)   SpO2 98%   BMI 29.44 kg/m  12/18/2022    1:28 PM 12/18/2022    1:04 PM 08/03/2022    9:29 AM  BP/Weight  Systolic BP 159 167 150  Diastolic BP 51 75 80  Wt. (Lbs)  166.2 176  BMI  29.44 kg/m2 31.18 kg/m2    Physical Exam Vitals and nursing note reviewed.  Constitutional:      General: She is not in acute distress.    Appearance: Normal appearance.  HENT:     Head: Normocephalic and atraumatic.  Eyes:     General:        Right eye: No discharge.        Left eye: No discharge.     Conjunctiva/sclera: Conjunctivae normal.  Cardiovascular:     Rate and Rhythm: Normal rate and regular rhythm.  Pulmonary:     Effort: Pulmonary effort is normal.     Breath sounds: Normal breath sounds. No wheezing, rhonchi or rales.  Abdominal:     General: There is no distension.     Palpations:  Abdomen is soft.     Tenderness: There is no abdominal tenderness.  Neurological:     Mental Status: She is alert.  Psychiatric:        Mood and Affect: Mood normal.        Behavior: Behavior normal.     Lab Results  Component Value Date   WBC 8.7 08/14/2021   HGB 13.3 08/14/2021   HCT 40.6 08/14/2021   PLT 368 08/14/2021   GLUCOSE 106 (H) 08/14/2021   CHOL 191 08/14/2021   TRIG 103 08/14/2021   HDL 60 08/14/2021   LDLCALC 113 (H) 08/14/2021   ALT 13 08/14/2021   AST 23 08/14/2021   NA 139 08/14/2021   K 4.2 08/14/2021   CL 101 08/14/2021   CREATININE 1.27 (H) 08/14/2021   BUN 27 08/14/2021   CO2 22 08/14/2021   TSH 1.050 08/14/2021   INR 1.13 09/27/2010   HGBA1C 6.1 (H) 08/14/2021     Assessment & Plan:   Problem List Items Addressed This Visit       Cardiovascular and Mediastinum   HTN (hypertension) - Primary    Further well-controlled per home readings.  Continue current medications.      Relevant Medications   metoprolol tartrate (LOPRESSOR) 25 MG tablet   spironolactone (ALDACTONE) 25 MG tablet   NIFEdipine (PROCARDIA XL/NIFEDICAL-XL) 90 MG 24 hr tablet     Genitourinary   CKD (chronic kidney disease) stage 3, GFR 30-59 ml/min (HCC)    Stable.        Other   Constipation    Senna as needed.       Meds ordered this encounter  Medications   senna (SENOKOT) 8.6 MG TABS tablet    Sig: Take 2 tablets (17.2 mg total) by mouth daily as needed for mild constipation.    Dispense:  120 tablet    Refill:  1   metoprolol tartrate (LOPRESSOR) 25 MG tablet    Sig: TAKE ONE-HALF TABLET BY MOUTH  TWICE DAILY (DOSE DECREASE)    Dispense:  90 tablet    Refill:  3    Please send a replace/new response with 90-Day Supply if appropriate to maximize member benefit. Requesting 1 year supply.   spironolactone (ALDACTONE) 25 MG tablet    Sig: Take 0.5 tablets (12.5 mg total) by mouth daily.    Dispense:  45 tablet    Refill:  3    Please send a replace/new  response  with 90-Day Supply if appropriate to maximize member benefit. Requesting 1 year supply.   NIFEdipine (PROCARDIA XL/NIFEDICAL-XL) 90 MG 24 hr tablet    Sig: Take 1 tablet (90 mg total) by mouth daily.    Dispense:  30 tablet    Refill:  11    Follow-up:  Return in about 6 months (around 06/20/2023).  Everlene Other DO [facility]

## 2023-02-13 ENCOUNTER — Other Ambulatory Visit (HOSPITAL_COMMUNITY): Payer: Self-pay | Admitting: Family Medicine

## 2023-02-13 DIAGNOSIS — Z1231 Encounter for screening mammogram for malignant neoplasm of breast: Secondary | ICD-10-CM

## 2023-02-16 ENCOUNTER — Ambulatory Visit: Payer: Medicare Other

## 2023-03-06 ENCOUNTER — Ambulatory Visit: Payer: Medicare Other | Attending: Cardiology | Admitting: Cardiology

## 2023-03-06 ENCOUNTER — Encounter: Payer: Self-pay | Admitting: Cardiology

## 2023-03-06 VITALS — BP 160/75 | HR 74 | Ht 63.0 in | Wt 166.4 lb

## 2023-03-06 DIAGNOSIS — R600 Localized edema: Secondary | ICD-10-CM

## 2023-03-06 DIAGNOSIS — I10 Essential (primary) hypertension: Secondary | ICD-10-CM

## 2023-03-06 NOTE — Patient Instructions (Addendum)
Medication Instructions:  Your physician recommends that you continue on your current medications as directed. Please refer to the Current Medication list given to you today.  *If you need a refill on your cardiac medications before your next appointment, please call your pharmacy*   Lab Work: None If you have labs (blood work) drawn today and your tests are completely normal, you will receive your results only by: MyChart Message (if you have MyChart) OR A paper copy in the mail If you have any lab test that is abnormal or we need to change your treatment, we will call you to review the results.   Testing/Procedures: None   Follow-Up: At [facility], you and your health needs are our priority.  As part of our continuing mission to provide you with exceptional heart care, we have created designated Provider Care Teams.  These Care Teams include your primary Cardiologist (physician) and Advanced Practice Providers (APPs -  Physician Assistants and Nurse Practitioners) who all work together to provide you with the care you need, when you need it.  We recommend signing up for the patient portal called "MyChart".  Sign up information is provided on this After Visit Summary.  MyChart is used to connect with patients for Virtual Visits (Telemedicine).  Patients are able to view lab/test results, encounter notes, upcoming appointments, etc.  Non-urgent messages can be sent to your provider as well.   To learn more about what you can do with MyChart, go to [email or web address].    Your next appointment:   1 Year  Provider:   You may see Dina Rich, MD or one of the following Advanced Practice Providers on your designated Care Team:   Randall An, PA-C  Jacolyn Reedy, [place]     Other Instructions

## 2023-03-06 NOTE — Progress Notes (Signed)
Clinical Summary Ms. Berland is a 82 y.o.female seen today for follow up of the following medical problems.   1.HTN   - reports diagnosed at age 42, she states has always been difficult to control   - bp log 07/2014 with borderline low bp's and heart rates, lopressor decreased to 12.5mg  bid - history of white coat HTN, often clinic bp's elevated while home numbers at goal     - 120s-130s/60s-70s at home.  - off chlorthalidone due to renal dysfunction   -home bps 130s/50s - at recent neprhology 134/58 - compliant with meds  - home bp's 130s/50s-60s - compliant with meds   2.LE edema - started several years ago - echo LVEF 65-70%, grade I     - denies any recent edema.      3. Dynamic LVOT gradient - no symptoms      4. CKD -followed by Dr Wolfgang Phoenix - reports recent labs with Dr Wolfgang Phoenix  5. Palpitations  - denies any recent palpitations     SH: son previusly passed due to complications of diabetes Past Medical History:  Diagnosis Date   Allergic rhinitis    Arthritis    Asthma    CKD (chronic kidney disease), stage III (HCC)    Hypertension    IFG (impaired fasting glucose)    Osteopenia    Spinal stenosis      Allergies  Allergen Reactions   Levaquin [Levofloxacin In D5w]     Sleep disturbance   Levofloxacin     Other reaction(s): GI Upset (intolerance) Sleep disturbance   Tolterodine     Abdominal pain Other reaction(s): Other (See Comments) Abdominal pain Abdominal pain     Current Outpatient Medications  Medication Sig Dispense Refill   albuterol (VENTOLIN HFA) 108 (90 Base) MCG/ACT inhaler INHALE TWO PUFFS BY MOUTH EVERY 6 HOURS AS NEEDED FOR WHEEZING 18 each 1   aspirin EC 81 MG tablet Take 81 mg by mouth daily.     calcitRIOL (ROCALTROL) 0.25 MCG capsule Take 0.25 mcg by mouth 3 (three) times a week. Monday-Wednesday & Friday     cholecalciferol (VITAMIN D3) 25 MCG (1000 UNIT) tablet Take 1,000 Units by mouth daily.      clobetasol (TEMOVATE) 0.05 % external solution Apply topically 2 (two) times daily as needed.     cloNIDine (CATAPRES) 0.2 MG tablet Take 0.2 mg by mouth 3 (three) times daily.     furosemide (LASIX) 20 MG tablet Take 20 mg by mouth as directed. Take 40 mg in the AM and Take 20 mg in the PM     metoprolol tartrate (LOPRESSOR) 25 MG tablet TAKE ONE-HALF TABLET BY MOUTH  TWICE DAILY (DOSE DECREASE) 90 tablet 3   NIFEdipine (PROCARDIA XL/NIFEDICAL-XL) 90 MG 24 hr tablet Take 1 tablet (90 mg total) by mouth daily. 30 tablet 11   senna (SENOKOT) 8.6 MG TABS tablet Take 2 tablets (17.2 mg total) by mouth daily as needed for mild constipation. 120 tablet 1   spironolactone (ALDACTONE) 25 MG tablet Take 0.5 tablets (12.5 mg total) by mouth daily. 45 tablet 3   triamcinolone cream (KENALOG) 0.1 % APPLY CREAM TOPICALLY TO AFFECTED AREA(S) TWICE DAILY 15 g 3   No current facility-administered medications for this visit.     Past Surgical History:  Procedure Laterality Date   ABDOMINAL HYSTERECTOMY     BACK SURGERY     BREAST EXCISIONAL BIOPSY Left    benign   CARPAL TUNNEL RELEASE  CHOLECYSTECTOMY     KNEE ARTHROSCOPY Left    TOTAL ABDOMINAL HYSTERECTOMY W/ BILATERAL SALPINGOOPHORECTOMY       Allergies  Allergen Reactions   Levaquin [Levofloxacin In D5w]     Sleep disturbance   Levofloxacin     Other reaction(s): GI Upset (intolerance) Sleep disturbance   Tolterodine     Abdominal pain Other reaction(s): Other (See Comments) Abdominal pain Abdominal pain      Family History  Problem Relation Age of Onset   Diabetes Mother    Hypertension Mother    Diabetes Father    Hypertension Father      Social History Ms. Iovine reports that she has never smoked. She has never used smokeless tobacco. Ms. Domina has no history on file for alcohol use.   Review of Systems CONSTITUTIONAL: No weight loss, fever, chills, weakness or fatigue.  HEENT: Eyes: No visual loss, blurred  vision, double vision or yellow sclerae.No hearing loss, sneezing, congestion, runny nose or sore throat.  SKIN: No rash or itching.  CARDIOVASCULAR: per hpi RESPIRATORY: No shortness of breath, cough or sputum.  GASTROINTESTINAL: No anorexia, nausea, vomiting or diarrhea. No abdominal pain or blood.  GENITOURINARY: No burning on urination, no polyuria NEUROLOGICAL: No headache, dizziness, syncope, paralysis, ataxia, numbness or tingling in the extremities. No change in bowel or bladder control.  MUSCULOSKELETAL: No muscle, back pain, joint pain or stiffness.  LYMPHATICS: No enlarged nodes. No history of splenectomy.  PSYCHIATRIC: No history of depression or anxiety.  ENDOCRINOLOGIC: No reports of sweating, cold or heat intolerance. No polyuria or polydipsia.  Marland Kitchen   Physical Examination Today's Vitals   03/06/23 1120 03/06/23 1136  BP: (!) 150/82 (!) 160/75  Pulse: 74   SpO2: 99%   Weight: 166 lb 6.4 oz (75.5 kg)   Height: 5\' 3"  (1.6 m)    Body mass index is 29.48 kg/m.  Gen: resting comfortably, no acute distress HEENT: no scleral icterus, pupils equal round and reactive, no palptable cervical adenopathy,  CV: RRR, no mrg, no jvd Resp: Clear to auscultation bilaterally GI: abdomen is soft, non-tender, non-distended, normal bowel sounds, no hepatosplenomegaly MSK: extremities are warm, no edema.  Skin: warm, no rash Neuro:  no focal deficits Psych: appropriate affect   Diagnostic Studies  01/2014 echo Study Conclusions  - Left ventricle: The cavity size was normal. Wall thickness was   increased in a pattern of mild LVH. Systolic function was   vigorous. The estimated ejection fraction was in the range of 65%   to 70%. There was dynamic obstruction during Valsalva in the mid   cavity caused by vigorous LV systolic function, with a peak   velocity of 393 cm/sec and a peak gradient of 62 mm Hg. Doppler   parameters are consistent with abnormal left ventricular   relaxation  (grade 1 diastolic dysfunction). - Left atrium: The atrium was mildly dilated. - Pulmonary arteries: PA peak pressure: 39 mm Hg (S). PASP is   borderline elevated. - Systemic veins: The IVC is small, suggestive of low RA pressure   and hypovolemia. - Technically adequate study.     Assessment and Plan   1. HTN   - elevated bp in clinic but her home numbers are at goal, this is her regular pattern - continue current meds  2. LE edema - well controlled, continue current meds  Request labs from Dr Wolfgang Phoenix   F/u 1 year     Antoine Poche, M.D.

## 2023-03-11 ENCOUNTER — Ambulatory Visit (HOSPITAL_COMMUNITY)
Admission: RE | Admit: 2023-03-11 | Discharge: 2023-03-11 | Disposition: A | Discharge status: Home / Self Care | Payer: Medicare Other | Source: Ambulatory Visit | Attending: Family Medicine | Admitting: Family Medicine

## 2023-03-11 DIAGNOSIS — Z1231 Encounter for screening mammogram for malignant neoplasm of breast: Secondary | ICD-10-CM | POA: Insufficient documentation

## 2023-05-05 ENCOUNTER — Other Ambulatory Visit: Payer: Self-pay | Admitting: Cardiology

## 2023-06-12 ENCOUNTER — Other Ambulatory Visit: Payer: Self-pay | Admitting: Family Medicine

## 2023-06-12 DIAGNOSIS — R0602 Shortness of breath: Secondary | ICD-10-CM

## 2023-06-12 MED ORDER — ALBUTEROL SULFATE HFA 108 (90 BASE) MCG/ACT IN AERS: INHALATION_SPRAY | RESPIRATORY_TRACT | 1 refills | Status: AC

## 2023-06-12 NOTE — Telephone Encounter (Signed)
Copied from CRM [id number]. Topic: Clinical - Medication Refill >> Jun 12, 2023  1:52 PM Ivette P wrote: Most Recent Primary Care Visit:  Provider: Tommie Sams  Department: [facility]  Visit Type: OFFICE VISIT  Date: 12/18/2022  Medication: albuterol (VENTOLIN HFA) 108 (90 Base) MCG/ACT inhaler   Has the patient contacted their pharmacy? Yes (Agent: If no, request that the patient contact the pharmacy for the refill. If patient does not wish to contact the pharmacy document the reason why and proceed with request.) (Agent: If yes, when and what did the pharmacy advise?)  Is this the correct pharmacy for this prescription? Yes If no, delete pharmacy and type the correct one.  This is the patient's preferred pharmacy:  [facility] [address] [place] - [address] [place] [address] [place] [address] Phone: [phone] Fax: [phone]   Has the prescription been filled recently? No  Is the patient out of the medication? No, expired.   Has the patient been seen for an appointment in the last year OR does the patient have an upcoming appointment? Yes  Can we respond through MyChart? No  Agent: Please be advised that Rx refills may take up to 3 business days. We ask that you follow-up with your pharmacy.

## 2023-06-19 ENCOUNTER — Encounter: Payer: Self-pay | Admitting: Physician Assistant

## 2023-06-19 ENCOUNTER — Ambulatory Visit: Payer: Medicare Other | Admitting: Physician Assistant

## 2023-06-19 VITALS — BP 132/78 | HR 80 | Temp 98.1°F | Ht 63.0 in | Wt 166.0 lb

## 2023-06-19 DIAGNOSIS — Z0000 Encounter for general adult medical examination without abnormal findings: Secondary | ICD-10-CM

## 2023-06-19 DIAGNOSIS — Z13220 Encounter for screening for lipoid disorders: Secondary | ICD-10-CM

## 2023-06-19 DIAGNOSIS — Z9229 Personal history of other drug therapy: Secondary | ICD-10-CM

## 2023-06-19 DIAGNOSIS — Z131 Encounter for screening for diabetes mellitus: Secondary | ICD-10-CM

## 2023-06-19 MED ORDER — TRIAMCINOLONE ACETONIDE 0.1 % EX CREA: TOPICAL_CREAM | CUTANEOUS | 3 refills | Status: AC | PRN

## 2023-06-19 NOTE — Progress Notes (Signed)
 Subjective:   [name] is a 83 y.o. female who presents for Medicare Annual (Subsequent) preventive examination.  Visit Complete: In person  Patient Medicare AWV questionnaire was completed by the patient; I have confirmed that all information answered by patient is correct and no changes since this date.       Objective:    Today's Vitals   06/19/23 1132  BP: 132/78  Pulse: 80  Temp: 98.1 F (36.7 C)  SpO2: 99%  Weight: 166 lb (75.3 kg)  Height: 5' 3 (1.6 m)   Body mass index is 29.41 kg/m.     06/19/2023   11:56 AM 05/25/2022   10:06 AM 09/21/2021    2:09 PM 04/25/2021    2:47 PM  Advanced Directives  Does Patient Have a Medical Advance Directive? No No No No  Would patient like information on creating a medical advance directive? Yes (MAU/Ambulatory/Procedural Areas - Information given) Yes (MAU/Ambulatory/Procedural Areas - Information given) No - Guardian declined No - Patient declined    Current Medications (verified) Outpatient Encounter Medications as of 06/19/2023  Medication Sig   albuterol  (VENTOLIN  HFA) 108 (90 Base) MCG/ACT inhaler INHALE TWO PUFFS BY MOUTH EVERY 6 HOURS AS NEEDED FOR WHEEZING   aspirin EC 81 MG tablet Take 81 mg by mouth daily.   calcitRIOL (ROCALTROL) 0.25 MCG capsule Take 0.25 mcg by mouth 3 (three) times a week. Monday-Wednesday & Friday   cholecalciferol (VITAMIN D3) 25 MCG (1000 UNIT) tablet Take 1,000 Units by mouth daily.   clobetasol (TEMOVATE) 0.05 % external solution Apply topically 2 (two) times daily as needed.   cloNIDine  (CATAPRES ) 0.2 MG tablet Take 0.2 mg by mouth 3 (three) times daily.   furosemide (LASIX) 20 MG tablet Take 20 mg by mouth as directed. Take 40 mg in the AM and Take 20 mg in the PM   metoprolol  tartrate (LOPRESSOR ) 25 MG tablet TAKE ONE-HALF TABLET BY MOUTH  TWICE DAILY   NIFEdipine  (PROCARDIA  XL/NIFEDICAL-XL) 90 MG 24 hr tablet Take 1 tablet (90 mg total) by mouth daily.   senna (SENOKOT) 8.6 MG  TABS tablet Take 2 tablets (17.2 mg total) by mouth daily as needed for mild constipation.   spironolactone  (ALDACTONE ) 25 MG tablet Take 25 mg by mouth daily.   [DISCONTINUED] triamcinolone  cream (KENALOG ) 0.1 % APPLY CREAM TOPICALLY TO AFFECTED AREA(S) TWICE DAILY   triamcinolone  cream (KENALOG ) 0.1 % Apply topically as needed.   No facility-administered encounter medications on file as of 06/19/2023.    Allergies (verified) Levaquin  [levofloxacin  in d5w], Levofloxacin , and Tolterodine   History: Past Medical History:  Diagnosis Date   Allergic rhinitis    Arthritis    Asthma    CKD (chronic kidney disease), stage III (HCC)    Hypertension    IFG (impaired fasting glucose)    Osteopenia    Spinal stenosis    Past Surgical History:  Procedure Laterality Date   ABDOMINAL HYSTERECTOMY     BACK SURGERY     BREAST EXCISIONAL BIOPSY Left    benign   CARPAL TUNNEL RELEASE     CHOLECYSTECTOMY     KNEE ARTHROSCOPY Left    TOTAL ABDOMINAL HYSTERECTOMY W/ BILATERAL SALPINGOOPHORECTOMY     Family History  Problem Relation Age of Onset   Diabetes Mother    Hypertension Mother    Diabetes Father    Hypertension Father    Social History   Socioeconomic History   Marital status: Married    Spouse name:  Not on file   Number of children: 4   Years of education: Not on file   Highest education level: Not on file  Occupational History   Not on file  Tobacco Use   Smoking status: Never   Smokeless tobacco: Never  Vaping Use   Vaping status: Never Used  Substance and Sexual Activity   Alcohol use: Never   Drug use: Never   Sexual activity: Not on file  Other Topics Concern   Not on file  Social History Narrative   Married x 56 years in 04/23/21.   1 son deceased at age 41.   3 living children.   Social Drivers of Corporate Investment Banker Strain: Low Risk  (05/25/2022)   Overall Financial Resource Strain (CARDIA)    Difficulty of Paying Living Expenses: Not hard at all   Food Insecurity: No Food Insecurity (05/25/2022)   Hunger Vital Sign    Worried About Running Out of Food in the Last Year: Never true    Ran Out of Food in the Last Year: Never true  Transportation Needs: No Transportation Needs (05/25/2022)   PRAPARE - Administrator, Civil Service (Medical): No    Lack of Transportation (Non-Medical): No  Physical Activity: Inactive (05/25/2022)   Exercise Vital Sign    Days of Exercise per Week: 0 days    Minutes of Exercise per Session: 0 min  Stress: No Stress Concern Present (05/25/2022)   [employer] of Occupational Health - Occupational Stress Questionnaire    Feeling of Stress : Not at all  Social Connections: Moderately Isolated (05/25/2022)   Social Connection and Isolation Panel [NHANES]    Frequency of Communication with Friends and Family: More than three times a week    Frequency of Social Gatherings with Friends and Family: Three times a week    Attends Religious Services: Never    Active Member of Clubs or Organizations: No    Attends Banker Meetings: Never    Marital Status: Married    Tobacco Counseling Counseling given: Not Answered   Clinical Intake:    No concerns for today's visit.              Activities of Daily Living     No data to display          Patient Care Team: Cook, Jayce G, DO as PCP - General (Family Medicine) Alvan, Dorn FALCON, MD as PCP - Cardiology (Cardiology) Alvan Dorn FALCON, MD as Consulting Physician (Cardiology) Rachele Gaynell RAMAN, MD as Consulting Physician (Nephrology) Roz Anes, MD as Consulting Physician (Ophthalmology) Miriam Norris, NP as Nurse Practitioner (Cardiology)  Indicate any recent Medical Services you may have received from other than [facility] providers in the past year (date may be approximate).     Assessment:   This is a routine wellness examination for [name].  Hearing/Vision screen No results found.   Goals Addressed    None   Depression Screen    06/19/2023   11:37 AM 12/18/2022    1:13 PM 06/19/2022    1:17 PM 05/25/2022   10:15 AM 04/25/2021    2:44 PM 01/10/2021    2:19 PM 09/13/2020    1:13 PM  PHQ 2/9 Scores  PHQ - 2 Score 0 0 0 0 0 0 0  PHQ- 9 Score 0 0 0        Fall Risk    06/19/2023   11:37 AM 12/18/2022    1:13  PM 06/19/2022    1:17 PM 05/25/2022   10:13 AM 04/25/2021    2:48 PM  Fall Risk   Falls in the past year? 0 0 0 0 0  Number falls in past yr:   0 0 0  Injury with Fall?   0 0 0  Risk for fall due to :   No Fall Risks No Fall Risks Impaired balance/gait;Impaired mobility  Follow up   Falls evaluation completed Falls evaluation completed;Education provided;Falls prevention discussed Falls prevention discussed    MEDICARE RISK AT HOME:    TIMED UP AND GO:  Was the test performed?  No    Cognitive Function: No concerns at this time         05/25/2022   10:13 AM 04/25/2021    2:54 PM  6CIT Screen  What Year? 0 points 0 points  What month? 0 points 0 points  What time? 0 points 0 points  Count back from 20 0 points 0 points  Months in reverse 0 points 0 points  Repeat phrase 0 points 0 points  Total Score 0 points 0 points    Immunizations Immunization History  Administered Date(s) Administered   Fluad Quad(high Dose 65+) 01/27/2019, 03/10/2022   Influenza Split 02/24/2013   Influenza,inj,Quad PF,6+ Mos 02/02/2014, 02/21/2015, 02/14/2016, 02/21/2017, 02/19/2018   Influenza-Unspecified 02/12/2011, 01/26/2015, 02/25/2021   Moderna Covid-19 Vaccine Bivalent Booster 29yrs & up 03/30/2021   Moderna SARS-COV2 Booster Vaccination 03/19/2020   Moderna Sars-Covid-2 Vaccination 05/26/2019, 07/06/2019   Pneumococcal Conjugate-13 02/21/2015   Pneumococcal Polysaccharide-23 02/12/2003, 05/14/2010   RSV,unspecified 02/28/2022   Unspecified SARS-COV-2 Vaccination 02/28/2022   Zoster, Live 06/12/2013    TDAP status: Due, Education has been provided regarding the importance of  this vaccine. Advised may receive this vaccine at local pharmacy or Health Dept. Aware to provide a copy of the vaccination record if obtained from local pharmacy or Health Dept. Verbalized acceptance and understanding.  Flu Vaccine status: Up to date  Pneumococcal vaccine status: Due, Education has been provided regarding the importance of this vaccine. Advised may receive this vaccine at local pharmacy or Health Dept. Aware to provide a copy of the vaccination record if obtained from local pharmacy or Health Dept. Verbalized acceptance and understanding.  Covid-19 vaccine status: Completed vaccines  Qualifies for Shingles Vaccine? Yes   Zostavax completed No   Shingrix Completed?: No.    Education has been provided regarding the importance of this vaccine. Patient has been advised to call insurance company to determine out of pocket expense if they have not yet received this vaccine. Advised may also receive vaccine at local pharmacy or Health Dept. Verbalized acceptance and understanding.  Screening Tests Health Maintenance  Topic Date Due   DTaP/Tdap/Td (1 - Tdap) Never done   Zoster Vaccines- Shingrix (1 of 2) 07/28/1959   COVID-19 Vaccine (5 - 2024-25 season) 01/13/2023   INFLUENZA VACCINE  08/12/2023 (Originally 12/13/2022)   Medicare Annual Wellness (AWV)  06/18/2024   Pneumonia Vaccine 91+ Years old  Completed   DEXA SCAN  Completed   HPV VACCINES  Aged Out    Health Maintenance  Health Maintenance Due  Topic Date Due   DTaP/Tdap/Td (1 - Tdap) Never done   Zoster Vaccines- Shingrix (1 of 2) 07/28/1959   COVID-19 Vaccine (5 - 2024-25 season) 01/13/2023    Colorectal cancer screening: No longer required.   Mammogram status: No longer required due to age.  DEXA scan- 2011  Lung Cancer Screening: (Low Dose CT  Chest recommended if Age 39-80 years, 20 pack-year currently smoking OR have quit w/in 15years.) does not qualify.   Additional Screening:  Hepatitis C Screening:  unknown   Vision Screening: Recommended annual ophthalmology exams for early detection of glaucoma and other disorders of the eye. Is the patient up to date with their annual eye exam?  Yes  If pt is not established with a provider, would they like to be referred to a provider to establish care?  N/a- established  .   Dental Screening: Recommended annual dental exams for proper oral hygiene   Community Resource Referral / Chronic Care Management: CRR required this visit?  No   CCM required this visit?  No     Plan:     I have personally reviewed and noted the following in the patient's chart:   Medical and social history Use of alcohol, tobacco or illicit drugs  Current medications and supplements including opioid prescriptions. Patient is not currently taking opioid prescriptions. Functional ability and status Nutritional status Physical activity Advanced directives List of other physicians Hospitalizations, surgeries, and ER visits in previous 12 months Vitals Screenings to include cognitive, depression, and falls Referrals and appointments  In addition, I have reviewed and discussed with patient certain preventive protocols, quality metrics, and best practice recommendations. A written personalized care plan for preventive services as well as general preventive health recommendations were provided to patient.     Charmaine Trishia Cuthrell, PA-C   06/19/2023   After Visit Summary: (In Person-Printed) AVS printed and given to the patient

## 2023-06-19 NOTE — Patient Instructions (Signed)

## 2023-06-20 ENCOUNTER — Ambulatory Visit: Payer: Medicare Other | Admitting: Family Medicine

## 2023-06-25 ENCOUNTER — Ambulatory Visit: Payer: Medicare Other | Admitting: Family Medicine

## 2023-06-27 ENCOUNTER — Encounter: Payer: Self-pay | Admitting: Nurse Practitioner

## 2023-06-27 ENCOUNTER — Ambulatory Visit: Payer: Medicare Other | Admitting: Nurse Practitioner

## 2023-06-27 VITALS — BP 130/70 | HR 61 | Temp 97.1°F | Ht 63.0 in | Wt 163.0 lb

## 2023-06-27 DIAGNOSIS — M19042 Primary osteoarthritis, left hand: Secondary | ICD-10-CM

## 2023-06-27 DIAGNOSIS — M19041 Primary osteoarthritis, right hand: Secondary | ICD-10-CM

## 2023-06-27 DIAGNOSIS — Z23 Encounter for immunization: Secondary | ICD-10-CM

## 2023-06-27 DIAGNOSIS — M65949 Unspecified synovitis and tenosynovitis, unspecified hand: Secondary | ICD-10-CM | POA: Diagnosis not present

## 2023-06-27 NOTE — Patient Instructions (Signed)
Try one at a time to finger on left hand as directed for pain: Voltaren gel  Tiger balm Lidocaine gel Also try ice/heat applications

## 2023-06-27 NOTE — Progress Notes (Signed)
Subjective:    Patient ID: [name], female    DOB: 01-Oct-1940, 83 y.o.   MRN: [id number]  HPI Presents for complaints of arthritis pain in her fingers.  Specifically the index finger of the right hand and the small finger on the left hand.  States she has arthritis changes in several joints of her body.  Left small finger has been locked in position for a long time.  Has had pain on the lateral side of the hand just beneath the proximal joint.  Pain in the right index finger at the metacarpal joint.  Also patient wishes to get her Prevnar 20 vaccine today.   Review of Systems  Respiratory:  Negative for cough, chest tightness, shortness of breath and wheezing.   Cardiovascular:  Negative for chest pain.  Musculoskeletal:  Positive for arthralgias.      06/27/2023    8:55 AM  Depression screen PHQ 2/9  Decreased Interest 0  Down, Depressed, Hopeless 0  PHQ - 2 Score 0  Altered sleeping 0  Tired, decreased energy 0  Change in appetite 0  Feeling bad or failure about yourself  0  Trouble concentrating 0  Moving slowly or fidgety/restless 0  Suicidal thoughts 0  PHQ-9 Score 0  Difficult doing work/chores Not difficult at all      06/27/2023    8:55 AM 06/19/2023   11:37 AM 06/19/2022    1:17 PM  GAD 7 : Generalized Anxiety Score  Nervous, Anxious, on Edge 0 0 0  Control/stop worrying 0 0 0  Worry too much - different things 0 0 0  Trouble relaxing 0 0 0  Restless 0 0 0  Easily annoyed or irritable 0 0 0  Afraid - awful might happen 0 0 0  Total GAD 7 Score 0 0 0  Anxiety Difficulty Not difficult at all  Not difficult at all         Objective:   Physical Exam NAD.  Alert, oriented.  Lungs clear.  Heart regular rate rhythm. Nodularity noted along the joints of all the fingers.  Distinct tenderness and very faint pink discoloration noted at the base of the right index finger.  Can perform full ROM without difficulty.  The small finger on the left side is locked into  a flexed position and unable to extend.  Mild tenderness along the lateral edge of the hand at the base of the finger. Today's Vitals   06/27/23 0850 06/27/23 0929  BP: (!) 142/85 130/70  Pulse: 61   Temp: (!) 97.1 F (36.2 C)   SpO2: 99%   Weight: 163 lb (73.9 kg)   Height: 5\' 3"  (1.6 m)    Body mass index is 28.87 kg/m.        Assessment & Plan:   Problem List Items Addressed This Visit       Musculoskeletal and Integument   Flexor tenosynovitis of finger   Relevant Orders   Ambulatory referral to Orthopedic Surgery   Primary osteoarthritis of both hands - Primary   Other Visit Diagnoses       Immunization due       Relevant Orders   Pneumococcal conjugate vaccine 20-valent (Completed)     Patient is unable to take oral NSAIDs due to her history of kidney disease. Try one at a time to finger on left hand as directed for pain: Voltaren gel  Tiger balm Lidocaine gel Also try ice/heat applications Will refer to orthopedics for evaluation  and treatment of the right small finger.  Patient agrees with this plan. Call back if hand pain persist. Prevnar 20 vaccine given today.

## 2023-07-29 ENCOUNTER — Encounter: Payer: Self-pay | Admitting: Podiatry

## 2023-07-29 ENCOUNTER — Ambulatory Visit (INDEPENDENT_AMBULATORY_CARE_PROVIDER_SITE_OTHER): Admitting: Podiatry

## 2023-07-29 DIAGNOSIS — B351 Tinea unguium: Secondary | ICD-10-CM | POA: Diagnosis not present

## 2023-07-29 NOTE — Progress Notes (Signed)
  Subjective:  Patient ID: [name], female    DOB: 1941-04-12,   MRN: [id number]  No chief complaint on file.   83 y.o. female presents for concern of bilateral great toenails and possible fungus. They have been this way for years. Has tried penlac in the past with no change. Relates she is tired of them . Denies any other pedal complaints. Denies n/v/f/c.   Past Medical History:  Diagnosis Date   Allergic rhinitis    Arthritis    Asthma    CKD (chronic kidney disease), stage III (HCC)    Hypertension    IFG (impaired fasting glucose)    Osteopenia    Spinal stenosis     Objective:  Physical Exam: Vascular: DP/PT pulses 2/4 bilateral. CFT <3 seconds. Normal hair growth on digits. No edema.  Skin. No lacerations or abrasions bilateral feet. Bilateral hallux nails thickened and dystrophic.  Musculoskeletal: MMT 5/5 bilateral lower extremities in DF, PF, Inversion and Eversion. Deceased ROM in DF of ankle joint.  Neurological: Sensation intact to light touch.   Assessment:   1. Onychomycosis      Plan:  Patient was evaluated and treated and all questions answered. -Examined patient -Discussed treatment options for painful dystrophic nails  -Clinical picture and Fungal culture was obtained by removing a portion of the hard nail itself from each of the involved toenails using a sterile nail nipper and sent to [facility] lab. Patient tolerated the biopsy procedure well without discomfort or need for anesthesia.  -Discussed fungal nail treatment options including oral, topical, and laser treatments.  -Patient to return in 4 weeks for follow up evaluation and discussion of fungal culture results or sooner if symptoms worsen.   Louann Sjogren, DPM

## 2023-07-29 NOTE — Addendum Note (Signed)
 Addended by: Daryel November on: 07/29/2023 02:19 PM   Modules accepted: Orders

## 2023-08-12 ENCOUNTER — Other Ambulatory Visit: Payer: Self-pay | Admitting: Podiatry

## 2023-08-26 ENCOUNTER — Ambulatory Visit (INDEPENDENT_AMBULATORY_CARE_PROVIDER_SITE_OTHER): Admitting: Podiatry

## 2023-08-26 DIAGNOSIS — B351 Tinea unguium: Secondary | ICD-10-CM | POA: Diagnosis not present

## 2023-08-26 MED ORDER — EFINACONAZOLE 10 % EX SOLN: 1.0000 [drp] | Freq: Every day | CUTANEOUS | 11 refills | Status: AC

## 2023-08-26 NOTE — Progress Notes (Signed)
  Subjective:  Patient ID: [name], female    DOB: [birth year],   MRN: [id number]  No chief complaint on file.   83 y.o. female presents for follow-up of bilateral nail fungus and to review culture results. . Denies any other pedal complaints. Denies n/v/f/c.   Past Medical History:  Diagnosis Date   Allergic rhinitis    Arthritis    Asthma    CKD (chronic kidney disease), stage III (HCC)    Hypertension    IFG (impaired fasting glucose)    Osteopenia    Spinal stenosis     Objective:  Physical Exam: Vascular: DP/PT pulses 2/4 bilateral. CFT <3 seconds. Normal hair growth on digits. No edema.  Skin. No lacerations or abrasions bilateral feet. Bilateral hallux nails thickened and dystrophic.  Musculoskeletal: MMT 5/5 bilateral lower extremities in DF, PF, Inversion and Eversion. Deceased ROM in DF of ankle joint.  Neurological: Sensation intact to light touch.   Assessment:   1. Onychomycosis       Plan:  Patient was evaluated and treated and all questions answered. -Examined patient -Discussed treatment options for painful dystrophic nails  -Culutre positive for fungus. T rubrum and with pigment likely from trauma -Discussed fungal nail treatment options including oral, topical, and laser treatments.  -Patient would like to try eficonazole after failing penlac.  -Patient to return in 4 weeks for follow up evaluation and discussion of fungal culture results or sooner if symptoms worsen.   Jennefer Moats, DPM

## 2023-09-04 ENCOUNTER — Telehealth: Payer: Self-pay

## 2023-09-04 NOTE — Telephone Encounter (Signed)
 Jublia  not covered by insurance, they are calling for a substitute. Please call pharmacy  [phone]

## 2023-09-10 ENCOUNTER — Telehealth: Payer: Self-pay | Admitting: Podiatry

## 2023-09-10 NOTE — Telephone Encounter (Signed)
 Jublia  not covered by insurance, they are calling for a substitute.      This prescription went to pharmacy in [facility] but Pt is now requesting to have substitute sent to pharmacy:  [facility] [address], [place] - [address] [place] [address] Phone: [phone]  Fax: [phone]

## 2023-09-11 ENCOUNTER — Other Ambulatory Visit: Payer: Self-pay | Admitting: Podiatry

## 2023-09-11 MED ORDER — TAVABOROLE 5 % EX SOLN: 1.0000 [drp] | CUTANEOUS | 2 refills | Status: AC

## 2023-09-11 NOTE — Telephone Encounter (Signed)
 Called Pt and gave message. She says " she hopes it's cheaper and thank you"

## 2024-02-17 ENCOUNTER — Encounter: Payer: Self-pay | Admitting: Podiatry

## 2024-02-17 ENCOUNTER — Ambulatory Visit (INDEPENDENT_AMBULATORY_CARE_PROVIDER_SITE_OTHER): Admitting: Podiatry

## 2024-02-17 DIAGNOSIS — B351 Tinea unguium: Secondary | ICD-10-CM | POA: Diagnosis not present

## 2024-02-17 NOTE — Progress Notes (Signed)
  Subjective:  Patient ID: [name], female    DOB: Jan 08, 1941,   MRN: [id number]  Chief Complaint  Patient presents with   Nail Problem    I'm going to get rid of this toenail fungus.  I want the laser therapy.    83 y.o. female presents for follow-up of bilateral nail fungus She has tried penlac  and failed and was nevere able to get the efinconazole.  She is considering laser therapy.  . Denies any other pedal complaints. Denies n/v/f/c.   Past Medical History:  Diagnosis Date   Allergic rhinitis    Arthritis    Asthma    CKD (chronic kidney disease), stage III (HCC)    Hypertension    IFG (impaired fasting glucose)    Osteopenia    Spinal stenosis     Objective:  Physical Exam: Vascular: DP/PT pulses 2/4 bilateral. CFT <3 seconds. Normal hair growth on digits. No edema.  Skin. No lacerations or abrasions bilateral feet. Bilateral hallux nails thickened and dystrophic.  Musculoskeletal: MMT 5/5 bilateral lower extremities in DF, PF, Inversion and Eversion. Deceased ROM in DF of ankle joint.  Neurological: Sensation intact to light touch.   Assessment:   1. Onychomycosis       Plan:  Patient was evaluated and treated and all questions answered. -Examined patient -Discussed treatment options for painful dystrophic nails  -Culutre positive for fungus. T rubrum and with pigment likely from trauma -Discussed fungal nail treatment options including oral, topical, and laser treatments.  -Patient would  like to try laser. Will get her scheduled.  -Patient to return after laser therapy.   Asberry Failing, DPM

## 2024-02-21 ENCOUNTER — Ambulatory Visit: Payer: Self-pay

## 2024-03-02 ENCOUNTER — Ambulatory Visit: Payer: Self-pay | Admitting: Family Medicine

## 2024-03-03 ENCOUNTER — Ambulatory Visit: Payer: Self-pay

## 2024-03-23 ENCOUNTER — Other Ambulatory Visit: Payer: Self-pay | Admitting: Cardiology

## 2024-04-03 ENCOUNTER — Ambulatory Visit (INDEPENDENT_AMBULATORY_CARE_PROVIDER_SITE_OTHER): Payer: Self-pay | Admitting: Podiatrist

## 2024-04-03 DIAGNOSIS — B351 Tinea unguium: Secondary | ICD-10-CM

## 2024-04-03 NOTE — Progress Notes (Signed)
 Patient presents today for the laser treatment # 1. Diagnosed with mycotic nail infection by Dr. Joya Done most affected are bilateral first.  All nails have some level of mycotic infection present.    All other systems are negative.  Patients nails were filed thin using a sterile burr.    Laser therapy via [facility] therapy system was admininstered to affected toenails x 10 .  The Patient tolerated the treatment well. All safety precautions were in place.     Follow up in 4 weeks for laser # 2

## 2024-04-07 ENCOUNTER — Encounter: Payer: Self-pay | Admitting: Cardiology

## 2024-04-07 ENCOUNTER — Ambulatory Visit: Attending: Cardiology | Admitting: Cardiology

## 2024-04-07 VITALS — BP 140/70 | HR 76 | Ht 63.0 in | Wt 162.0 lb

## 2024-04-07 DIAGNOSIS — R600 Localized edema: Secondary | ICD-10-CM

## 2024-04-07 DIAGNOSIS — I10 Essential (primary) hypertension: Secondary | ICD-10-CM | POA: Diagnosis not present

## 2024-04-07 NOTE — Patient Instructions (Signed)

## 2024-04-07 NOTE — Progress Notes (Signed)
 Clinical Summary [name] is a 83 y.o.female seen today for follow up of the following medical problems.   1.HTN   - reports diagnosed at age 79, she states has always been difficult to control   - bp log 07/2014 with borderline low bp's and heart rates, lopressor  decreased to 12.5mg  bid - history of white coat HTN, often clinic bp's elevated while home numbers at goal  - off chlorthalidone  due to renal dysfunction  - home bp's typically 110s-130s/60s - compliant with meds   2.LE edema - started several years ago - echo LVEF 65-70%, grade I -typically R>L, overall stable  - diuretic managed by nephrology. Lasix 40mg  in AM and 20mg  in PM, will take additional 20mg  prn.    3. Dynamic LVOT gradient - no symptoms      4. CKD -followed by Dr Rachele - reports recent labs with Dr Rachele - upcoming labs with neprhogogy early Dec   5. Palpitations  - denies any recent palpitations       SH: son previusly passed due to complications of diabetes Past Medical History:  Diagnosis Date   Allergic rhinitis    Arthritis    Asthma    CKD (chronic kidney disease), stage III (HCC)    Hypertension    IFG (impaired fasting glucose)    Osteopenia    Spinal stenosis      Allergies  Allergen Reactions   Levaquin  [Levofloxacin  In D5w]     Sleep disturbance   Levofloxacin      Other reaction(s): GI Upset (intolerance) Sleep disturbance   Tolterodine     Abdominal pain Other reaction(s): Other (See Comments) Abdominal pain Abdominal pain     Current Outpatient Medications  Medication Sig Dispense Refill   albuterol  (VENTOLIN  HFA) 108 (90 Base) MCG/ACT inhaler INHALE TWO PUFFS BY MOUTH EVERY 6 HOURS AS NEEDED FOR WHEEZING 18 each 1   aspirin EC 81 MG tablet Take 81 mg by mouth daily.     cholecalciferol (VITAMIN D3) 25 MCG (1000 UNIT) tablet Take 1,000 Units by mouth daily.     clobetasol (TEMOVATE) 0.05 % external solution Apply topically 2 (two) times daily as  needed.     cloNIDine  (CATAPRES ) 0.2 MG tablet Take 0.2 mg by mouth 3 (three) times daily.     Efinaconazole  10 % SOLN Apply 1 drop topically daily. 4 mL 11   furosemide (LASIX) 20 MG tablet Take 20 mg by mouth as directed. Take 40 mg in the AM and Take 20 mg in the PM     metoprolol  tartrate (LOPRESSOR ) 25 MG tablet TAKE ONE-HALF TABLET BY MOUTH  TWICE DAILY 90 tablet 0   NIFEdipine  (PROCARDIA  XL/NIFEDICAL-XL) 90 MG 24 hr tablet Take 1 tablet (90 mg total) by mouth daily. 30 tablet 11   senna (SENOKOT) 8.6 MG TABS tablet Take 2 tablets (17.2 mg total) by mouth daily as needed for mild constipation. 120 tablet 1   spironolactone  (ALDACTONE ) 25 MG tablet Take 25 mg by mouth daily.     Tavaborole  (KERYDIN ) 5 % SOLN Apply 1 drop topically 1 day or 1 dose. Apply 1 drop to the toenail daily. 10 mL 2   triamcinolone  cream (KENALOG ) 0.1 % Apply topically as needed. 15 g 3   No current facility-administered medications for this visit.     Past Surgical History:  Procedure Laterality Date   ABDOMINAL HYSTERECTOMY     BACK SURGERY     BREAST EXCISIONAL BIOPSY Left  benign   CARPAL TUNNEL RELEASE     CHOLECYSTECTOMY     KNEE ARTHROSCOPY Left    TOTAL ABDOMINAL HYSTERECTOMY W/ BILATERAL SALPINGOOPHORECTOMY       Allergies  Allergen Reactions   Levaquin  [Levofloxacin  In D5w]     Sleep disturbance   Levofloxacin      Other reaction(s): GI Upset (intolerance) Sleep disturbance   Tolterodine     Abdominal pain Other reaction(s): Other (See Comments) Abdominal pain Abdominal pain      Family History  Problem Relation Age of Onset   Diabetes Mother    Hypertension Mother    Diabetes Father    Hypertension Father      Social History [name] reports that she has never smoked. She has never used smokeless tobacco. [name] reports no history of alcohol use.    Physical Examination Vitals:   04/07/24 1037 04/07/24 1113  BP: (!) 148/78 (!) 140/70  Pulse: 76   SpO2:  99%    Filed Weights   04/07/24 1037  Weight: 162 lb (73.5 kg)    Gen: resting comfortably, no acute distress HEENT: no scleral icterus, pupils equal round and reactive, no palptable cervical adenopathy,  CV: RRR, no m/rg, no jvd Resp: Clear to auscultation bilaterally GI: abdomen is soft, non-tender, non-distended, normal bowel sounds, no hepatosplenomegaly MSK: extremities are warm, no edema.  Skin: warm, no rash Neuro:  no focal deficits Psych: appropriate affect   Diagnostic Studies  01/2014 echo Study Conclusions  - Left ventricle: The cavity size was normal. Wall thickness was   increased in a pattern of mild LVH. Systolic function was   vigorous. The estimated ejection fraction was in the range of 65%   to 70%. There was dynamic obstruction during Valsalva in the mid   cavity caused by vigorous LV systolic function, with a peak   velocity of 393 cm/sec and a peak gradient of 62 mm Hg. Doppler   parameters are consistent with abnormal left ventricular   relaxation (grade 1 diastolic dysfunction). - Left atrium: The atrium was mildly dilated. - Pulmonary arteries: PA peak pressure: 39 mm Hg (S). PASP is   borderline elevated. - Systemic veins: The IVC is small, suggestive of low RA pressure   and hypovolemia. - Technically adequate study.     Assessment and Plan  1. HTN   - elevated bp in clinic but her home numbers are at goal, this is her regular pattern -home bp's remain at goal, continue current meds   2. LE edema -stable swelling, we have deferred diuretic management to neprhology - continue current regimen  EKG today shows NSR    Dorn PHEBE Ross, M.D.,

## 2024-06-05 ENCOUNTER — Ambulatory Visit (INDEPENDENT_AMBULATORY_CARE_PROVIDER_SITE_OTHER)

## 2024-06-05 DIAGNOSIS — B351 Tinea unguium: Secondary | ICD-10-CM

## 2024-06-05 NOTE — Progress Notes (Signed)
 Patient presents today for the 2nd laser treatment. Diagnosed with mycotic nail infection by Dr. Sikora.   Toenail most affected 1st bilaterally. All nails have some level of mycotic infection present.  All other systems are negative.  Nails were filed thin. Laser therapy was administered to 1-5 toenails bilaterally and patient tolerated the treatment well. All safety precautions were in place.   Post treatment instructions reviewed and provided to patient. Patient had no questions regarding plan of care.   Follow up in 4 weeks for laser # 3.

## 2024-06-16 ENCOUNTER — Other Ambulatory Visit: Payer: Self-pay | Admitting: Cardiology

## 2024-07-07 ENCOUNTER — Ambulatory Visit

## 2024-07-30 ENCOUNTER — Ambulatory Visit

## 2024-09-09 ENCOUNTER — Ambulatory Visit: Admitting: Podiatry
